# Patient Record
Sex: Female | Born: 1975 | Race: Black or African American | Hispanic: No | Marital: Married | Smoking: Never smoker
Health system: Southern US, Community
[De-identification: ages and names within clinical notes are randomized; demographics above are authoritative.]

## PROBLEM LIST (undated history)

## (undated) DIAGNOSIS — IMO0002 Reserved for concepts with insufficient information to code with codable children: Secondary | ICD-10-CM

## (undated) DIAGNOSIS — Z8619 Personal history of other infectious and parasitic diseases: Secondary | ICD-10-CM

## (undated) DIAGNOSIS — I10 Essential (primary) hypertension: Secondary | ICD-10-CM

## (undated) DIAGNOSIS — E66813 Obesity, class 3: Secondary | ICD-10-CM

## (undated) DIAGNOSIS — B009 Herpesviral infection, unspecified: Secondary | ICD-10-CM

## (undated) DIAGNOSIS — R0602 Shortness of breath: Secondary | ICD-10-CM

## (undated) DIAGNOSIS — E785 Hyperlipidemia, unspecified: Secondary | ICD-10-CM

## (undated) DIAGNOSIS — G4730 Sleep apnea, unspecified: Secondary | ICD-10-CM

## (undated) HISTORY — DX: Sleep apnea, unspecified: G47.30

## (undated) HISTORY — DX: Reserved for concepts with insufficient information to code with codable children: IMO0002

## (undated) HISTORY — DX: Obesity, class 3: E66.813

## (undated) HISTORY — DX: Herpesviral infection, unspecified: B00.9

## (undated) HISTORY — DX: Hyperlipidemia, unspecified: E78.5

## (undated) HISTORY — DX: Essential (primary) hypertension: I10

## (undated) HISTORY — DX: Morbid (severe) obesity due to excess calories: E66.01

## (undated) HISTORY — PX: TONSILLECTOMY: SUR1361

## (undated) HISTORY — DX: Personal history of other infectious and parasitic diseases: Z86.19

## (undated) HISTORY — PX: REFRACTIVE SURGERY: SHX103

---

## 1998-10-18 ENCOUNTER — Other Ambulatory Visit: Admission: RE | Admit: 1998-10-18 | Discharge: 1998-10-18 | Payer: Self-pay | Admitting: Obstetrics

## 1998-10-18 ENCOUNTER — Encounter (INDEPENDENT_AMBULATORY_CARE_PROVIDER_SITE_OTHER): Payer: Self-pay | Admitting: Specialist

## 1998-12-14 ENCOUNTER — Encounter: Payer: Self-pay | Admitting: Emergency Medicine

## 1998-12-14 ENCOUNTER — Inpatient Hospital Stay (HOSPITAL_COMMUNITY): Admission: EM | Admit: 1998-12-14 | Discharge: 1998-12-19 | Payer: Self-pay | Admitting: Emergency Medicine

## 1999-06-25 ENCOUNTER — Inpatient Hospital Stay (HOSPITAL_COMMUNITY): Admission: AD | Admit: 1999-06-25 | Discharge: 1999-06-25 | Payer: Self-pay | Admitting: Obstetrics

## 1999-06-26 ENCOUNTER — Encounter: Payer: Self-pay | Admitting: Obstetrics and Gynecology

## 1999-06-26 ENCOUNTER — Ambulatory Visit (HOSPITAL_COMMUNITY): Admission: RE | Admit: 1999-06-26 | Discharge: 1999-06-26 | Payer: Self-pay | Admitting: Obstetrics and Gynecology

## 2000-03-01 ENCOUNTER — Encounter: Payer: Self-pay | Admitting: Emergency Medicine

## 2000-03-01 ENCOUNTER — Emergency Department (HOSPITAL_COMMUNITY): Admission: EM | Admit: 2000-03-01 | Discharge: 2000-03-01 | Payer: Self-pay | Admitting: Emergency Medicine

## 2000-07-16 ENCOUNTER — Emergency Department (HOSPITAL_COMMUNITY): Admission: EM | Admit: 2000-07-16 | Discharge: 2000-07-16 | Payer: Self-pay | Admitting: Emergency Medicine

## 2000-09-23 ENCOUNTER — Ambulatory Visit (HOSPITAL_BASED_OUTPATIENT_CLINIC_OR_DEPARTMENT_OTHER): Admission: RE | Admit: 2000-09-23 | Discharge: 2000-09-23 | Payer: Self-pay | Admitting: *Deleted

## 2001-03-02 HISTORY — PX: OVARIAN CYST REMOVAL: SHX89

## 2001-07-28 ENCOUNTER — Encounter: Payer: Self-pay | Admitting: Obstetrics and Gynecology

## 2001-07-28 ENCOUNTER — Ambulatory Visit (HOSPITAL_COMMUNITY): Admission: RE | Admit: 2001-07-28 | Discharge: 2001-07-28 | Payer: Self-pay | Admitting: Obstetrics and Gynecology

## 2001-08-16 ENCOUNTER — Inpatient Hospital Stay (HOSPITAL_COMMUNITY): Admission: AD | Admit: 2001-08-16 | Discharge: 2001-08-16 | Payer: Self-pay | Admitting: Obstetrics and Gynecology

## 2001-09-07 ENCOUNTER — Encounter (INDEPENDENT_AMBULATORY_CARE_PROVIDER_SITE_OTHER): Payer: Self-pay

## 2001-09-07 ENCOUNTER — Inpatient Hospital Stay (HOSPITAL_COMMUNITY): Admission: AD | Admit: 2001-09-07 | Discharge: 2001-09-10 | Payer: Self-pay | Admitting: Obstetrics and Gynecology

## 2001-09-11 ENCOUNTER — Encounter: Admission: RE | Admit: 2001-09-11 | Discharge: 2001-10-11 | Payer: Self-pay | Admitting: Obstetrics and Gynecology

## 2001-12-01 ENCOUNTER — Encounter: Admission: RE | Admit: 2001-12-01 | Discharge: 2001-12-13 | Payer: Self-pay | Admitting: Internal Medicine

## 2002-02-13 ENCOUNTER — Other Ambulatory Visit: Admission: RE | Admit: 2002-02-13 | Discharge: 2002-02-13 | Payer: Self-pay | Admitting: Obstetrics and Gynecology

## 2002-08-17 ENCOUNTER — Other Ambulatory Visit: Admission: RE | Admit: 2002-08-17 | Discharge: 2002-08-17 | Payer: Self-pay | Admitting: Obstetrics and Gynecology

## 2003-01-30 ENCOUNTER — Ambulatory Visit (HOSPITAL_BASED_OUTPATIENT_CLINIC_OR_DEPARTMENT_OTHER): Admission: RE | Admit: 2003-01-30 | Discharge: 2003-01-30 | Payer: Self-pay | Admitting: Plastic Surgery

## 2003-01-30 ENCOUNTER — Encounter (INDEPENDENT_AMBULATORY_CARE_PROVIDER_SITE_OTHER): Payer: Self-pay | Admitting: Specialist

## 2003-01-30 ENCOUNTER — Ambulatory Visit (HOSPITAL_COMMUNITY): Admission: RE | Admit: 2003-01-30 | Discharge: 2003-01-30 | Payer: Self-pay | Admitting: Plastic Surgery

## 2003-09-04 ENCOUNTER — Emergency Department (HOSPITAL_COMMUNITY): Admission: EM | Admit: 2003-09-04 | Discharge: 2003-09-04 | Payer: Self-pay | Admitting: Emergency Medicine

## 2003-09-10 ENCOUNTER — Emergency Department (HOSPITAL_COMMUNITY): Admission: EM | Admit: 2003-09-10 | Discharge: 2003-09-10 | Payer: Self-pay | Admitting: Family Medicine

## 2003-10-16 ENCOUNTER — Encounter: Admission: RE | Admit: 2003-10-16 | Discharge: 2003-10-16 | Payer: Self-pay | Admitting: Internal Medicine

## 2004-12-09 ENCOUNTER — Other Ambulatory Visit: Admission: RE | Admit: 2004-12-09 | Discharge: 2004-12-09 | Payer: Self-pay | Admitting: Obstetrics and Gynecology

## 2005-03-18 ENCOUNTER — Emergency Department (HOSPITAL_COMMUNITY): Admission: EM | Admit: 2005-03-18 | Discharge: 2005-03-18 | Payer: Self-pay | Admitting: Emergency Medicine

## 2006-01-20 ENCOUNTER — Other Ambulatory Visit: Admission: RE | Admit: 2006-01-20 | Discharge: 2006-01-20 | Payer: Self-pay | Admitting: Obstetrics and Gynecology

## 2006-02-17 ENCOUNTER — Encounter: Admission: RE | Admit: 2006-02-17 | Discharge: 2006-02-17 | Payer: Self-pay | Admitting: Internal Medicine

## 2007-03-03 HISTORY — PX: WISDOM TOOTH EXTRACTION: SHX21

## 2008-03-02 DIAGNOSIS — B009 Herpesviral infection, unspecified: Secondary | ICD-10-CM

## 2008-03-02 HISTORY — DX: Herpesviral infection, unspecified: B00.9

## 2009-10-02 ENCOUNTER — Encounter: Admission: RE | Admit: 2009-10-02 | Discharge: 2009-11-28 | Payer: Self-pay | Admitting: Internal Medicine

## 2010-04-02 DIAGNOSIS — R87619 Unspecified abnormal cytological findings in specimens from cervix uteri: Secondary | ICD-10-CM

## 2010-04-02 DIAGNOSIS — IMO0002 Reserved for concepts with insufficient information to code with codable children: Secondary | ICD-10-CM

## 2010-04-02 HISTORY — DX: Reserved for concepts with insufficient information to code with codable children: IMO0002

## 2010-04-02 HISTORY — DX: Unspecified abnormal cytological findings in specimens from cervix uteri: R87.619

## 2010-07-18 NOTE — Op Note (Signed)
NAME:  Tina Flores, Tina Flores                            ACCOUNT NO.:  [id number]   MEDICAL RECORD NO.:  [id number]                   PATIENT TYPE:  OUT   LOCATION:  [facility]                                 FACILITY:  [facility]   PHYSICIAN:  Etter Sjogren, M.D.                  DATE OF BIRTH:  Jul 08, 1975   DATE OF PROCEDURE:  01/30/2003  DATE OF DISCHARGE:  01/30/2003                                 OPERATIVE REPORT   PREOPERATIVE DIAGNOSIS:  Very large, recurrent soft tissue mass of the  forehead of undetermined behavior, greater than 2.4 cm in largest diameter.   POSTOPERATIVE DIAGNOSES:  1. Very large, recurrent soft tissue mass of the forehead of undetermined     behavior, greater than 2.4 cm in largest diameter.  2. Complicated open wound of the forehead, approximately 5 cm.   PROCEDURES PERFORMED:  1. Excision of large soft tissue mass, extending down into the muscle,     forehead, greater than 2.4 cm in diameter.  2. Complex wound closure of the forehead __________ cm.   SURGEON:  Etter Sjogren, M.D.   ANESTHESIA:  1% Xylocaine with epinephrine placed.   CLINICAL NOTE:  This 35 year old woman has had a very large cyst of the  forehead.  This has been excised on two separate occasions by two other  physicians, and each time, it has recurred.  She presents at this time for a  repeat excision of this very persistent, difficult tumor.  The nature,  procedure, and risks were understood by her, including the possibility of  some hair loss where it extends up towards the hair line.  She understood  the possibility of recurrence, and she wishes to proceed.   PROCEDURE:  The patient was brought to the outpatient surgical suite and was  placed supine.  She was prepped with Betadine and draped with sterile  drapes.  Satisfactory local anesthesia was achieved.  The elliptical  incision was performed, and careful dissection was carried down to remove  the cyst in its entirety, removing the entire wall.   The cyst was not  ruptured. Thorough irrigation and hemostasis was confirmed.  Layered closure  was then performed using Monocryl sutures deep and interrupted inverted deep  sutures and interrupted inverted normal sutures, followed by 6-0 Prolene  sutures.  Antibiotic ointment was applied, and she tolerated the procedure  well.  She will see me in the office early next week.                                               Etter Sjogren, M.D.    DB/MEDQ  D:  03/15/2003  T:  03/15/2003  Job:  [id number]

## 2010-07-18 NOTE — H&P (Signed)
[facility]  Patient:    Tina Flores, Tina Flores Visit Number: [id number] MRN: [id number]          Service Type: OBS Location: [facility] Attending Physician:  Michael Litter Dictated by:   [name], M.D. Admit Date:  08/16/2001 Discharge Date: 08/16/2001                           History and Physical  HISTORY OF PRESENT ILLNESS:   The patient is a 35 year old female, gravida 2, para 0-0-1-0, who presents at [redacted] weeks gestation (EDC of September 13, 2001).  She presents for cesarean section and excision of a dermoid cyst.  The patient has been followed at [facility] for this pregnancy that has been complicated by the fact that she has a history of asthma.  She is also rubella nonimmune.  She has pregnancy-induced hypertension and she is currently taking Aldomet 500 mg b.i.d.  Nonstress tests have been reactive. She was found to have a breech infant on ultrasound.  She wishes to proceed with cesarean section.  The patient had an 8-cm mass noted on ultrasound that was consistent with a dermoid cyst.  OBSTETRICAL HISTORY:          The patient had an elective pregnancy termination in 1994.  PAST MEDICAL HISTORY:         The patient has a history of asthma.  She was taking Advair and albuterol.  DRUG ALLERGIES:               None known.  SOCIAL HISTORY:               The patient drank alcohol socially prior to pregnancy.  She denies cigarette use and other drug uses.  REVIEW OF SYSTEMS:            Normal pregnancy complaints.  FAMILY HISTORY:               Noncontributory.  PHYSICAL EXAMINATION:  VITAL SIGNS:                  Weight is 302 pounds.  HEENT:                        Within normal limits.  CHEST:                        Clear.  HEART:                        Regular rate and rhythm.  BREASTS:                      Without masses.  ABDOMEN:                      Gravid with a fundal height of 40  cm.  EXTREMITIES:                  Within normal limits and she is noted to have some edema.  NEUROLOGIC EXAM:              Grossly normal.  LABORATORY VALUES:            Blood type is O positive.  VDRL is nonreactive. Rubella nonimmune.  HBsAg negative.  HIV nonreactive.  Gonorrhea negative. Chlamydia negative.  Three-hour  GTT was within normal limits.  Third trimester beta strep is negative.  ASSESSMENT: 1. A 39-week gestation. 2. Breech infant. 3. An 8-cm left ovarian cyst consistent with a dermoid. 4. Gestational hypertension. 5. Obesity (weight 302 pounds). 6. Rubella nonimmune.  PLAN:                         The patient will undergo a cesarean section and left ovarian cystectomy for removal of a dermoid.  She understands the indications for her procedure and she accepts the risks of, but not limited to, anesthetic complications, bleeding, infections and possible damage to the surrounding organs. Dictated by:   [name], M.D. Attending Physician:  Michael Litter DD:  09/06/01 TD:  09/06/01 Job: [id number]

## 2010-07-18 NOTE — Discharge Summary (Signed)
[facility]  Patient:    Tina Flores, Tina Flores Visit Number: [id number] MRN: [id number]          Service Type: OBS Location: 910A 9115 01 Attending Physician:  Leonard Schwartz Dictated by:   Marcelle Smiling Clelia Croft, C.N.M. Admit Date:  09/07/2001 Discharge Date: 09/10/2001                             Discharge Summary  DATE OF BIRTH:  05-31-1975  ADMISSION DIAGNOSES: 1. Intrauterine pregnancy at term. 2. Breech presentation. 3. Hypertension. 4. Left ovarian dermoid cyst, approximate size is 8 cm.  DISCHARGE DIAGNOSES: 1. Intrauterine pregnancy at term. 2. Breech presentation. 3. Hypertension. 4. Left ovarian dermoid cyst, approximate size is 8 cm. 5. Primary low transverse cesarean section.  PROCEDURES: 1. Primary low transverse cesarean section. 2. Left ovarian cystectomy.  HOSPITAL COURSE:  The patient is a 35 year old female, gravida 2, para 0-0-1-0, who presented at [redacted] weeks gestation for a planned cesarean section and excision of a dermoid cyst.  Pregnancy had been remarkable for a history of asthma, rubella nonimmune, pregnancy induced hypertension.  Cesarean section was performed by Dr. Stefano Gaul and Mack Guise, C.N.M.  Infant was a viable female named [name] with Apgars of 8 and 9, and weight of 6 pounds 11 ounces.  Infant was doing well, was taken to the full-term nursery.  The patient was doing well and taken to the recovery room.  The patient was breast-feeding.  On postoperative day #1, her hemoglobin was 8.6, and preoperatively had been 10.5.  She expressed a desire for Nat Math IUD to be used for contraception.  She continued on Aldomet 500 mg p.o. b.i.d. while in the hospital, and her blood pressures remained anywhere from 130 to 140/58 to 90 during her hospital stay.  By postoperative day #3, she continued to do well, and was deemed to have received the full benefit of her hospital stay. She was discharged  home.  DISCHARGE INSTRUCTIONS:  Per [facility] handout.  DISCHARGE MEDICATIONS: 1. Motrin 600 mg p.o. q.6h. p.r.n. pain. 2. Tylox one or two p.o. q.3-4h. p.r.n. pain. 3. Aldomet 500 mg p.o. b.i.d. 4. Prenatal vitamin one p.o. q.d.  FOLLOWUP:  At one week postpartum at [facility] for blood pressure check. Dictated by:   Marcelle Smiling Clelia Croft, C.N.M. Attending Physician:  Leonard Schwartz DD:  09/10/01 TD:  09/13/01 Job: [id number]

## 2011-03-31 LAB — OB RESULTS CONSOLE HEPATITIS B SURFACE ANTIGEN: Hepatitis B Surface Ag: NEGATIVE

## 2011-03-31 LAB — OB RESULTS CONSOLE ABO/RH: RH Type: POSITIVE

## 2011-03-31 LAB — OB RESULTS CONSOLE RPR: RPR: NONREACTIVE

## 2011-03-31 LAB — OB RESULTS CONSOLE HIV ANTIBODY (ROUTINE TESTING): HIV: NONREACTIVE

## 2011-05-05 ENCOUNTER — Encounter: Payer: PRIVATE HEALTH INSURANCE | Admitting: Obstetrics and Gynecology

## 2011-05-13 ENCOUNTER — Encounter (INDEPENDENT_AMBULATORY_CARE_PROVIDER_SITE_OTHER): Payer: PRIVATE HEALTH INSURANCE | Admitting: Obstetrics and Gynecology

## 2011-05-13 DIAGNOSIS — Z331 Pregnant state, incidental: Secondary | ICD-10-CM

## 2011-05-18 ENCOUNTER — Encounter: Payer: PRIVATE HEALTH INSURANCE | Admitting: Obstetrics and Gynecology

## 2011-05-21 ENCOUNTER — Encounter: Payer: PRIVATE HEALTH INSURANCE | Admitting: Obstetrics and Gynecology

## 2011-05-28 ENCOUNTER — Encounter (INDEPENDENT_AMBULATORY_CARE_PROVIDER_SITE_OTHER): Payer: PRIVATE HEALTH INSURANCE | Admitting: Obstetrics and Gynecology

## 2011-05-28 DIAGNOSIS — Z3480 Encounter for supervision of other normal pregnancy, unspecified trimester: Secondary | ICD-10-CM

## 2011-06-02 ENCOUNTER — Other Ambulatory Visit: Payer: Self-pay

## 2011-06-02 DIAGNOSIS — Z3689 Encounter for other specified antenatal screening: Secondary | ICD-10-CM

## 2011-06-05 DIAGNOSIS — E119 Type 2 diabetes mellitus without complications: Secondary | ICD-10-CM

## 2011-06-05 DIAGNOSIS — I10 Essential (primary) hypertension: Secondary | ICD-10-CM | POA: Insufficient documentation

## 2011-06-05 DIAGNOSIS — Z6841 Body Mass Index (BMI) 40.0 and over, adult: Secondary | ICD-10-CM | POA: Insufficient documentation

## 2011-06-05 DIAGNOSIS — IMO0001 Reserved for inherently not codable concepts without codable children: Secondary | ICD-10-CM | POA: Insufficient documentation

## 2011-06-05 DIAGNOSIS — O09529 Supervision of elderly multigravida, unspecified trimester: Secondary | ICD-10-CM | POA: Insufficient documentation

## 2011-06-08 ENCOUNTER — Encounter: Payer: PRIVATE HEALTH INSURANCE | Admitting: Obstetrics and Gynecology

## 2011-06-15 ENCOUNTER — Encounter: Payer: Self-pay | Admitting: Obstetrics and Gynecology

## 2011-06-15 ENCOUNTER — Ambulatory Visit (INDEPENDENT_AMBULATORY_CARE_PROVIDER_SITE_OTHER): Payer: PRIVATE HEALTH INSURANCE

## 2011-06-15 ENCOUNTER — Ambulatory Visit (INDEPENDENT_AMBULATORY_CARE_PROVIDER_SITE_OTHER): Payer: PRIVATE HEALTH INSURANCE | Admitting: Obstetrics and Gynecology

## 2011-06-15 ENCOUNTER — Other Ambulatory Visit: Payer: Self-pay | Admitting: Obstetrics and Gynecology

## 2011-06-15 VITALS — BP 124/82 | Ht 67.0 in | Wt 330.0 lb

## 2011-06-15 DIAGNOSIS — O99810 Abnormal glucose complicating pregnancy: Secondary | ICD-10-CM

## 2011-06-15 DIAGNOSIS — Z3689 Encounter for other specified antenatal screening: Secondary | ICD-10-CM

## 2011-06-15 DIAGNOSIS — O24419 Gestational diabetes mellitus in pregnancy, unspecified control: Secondary | ICD-10-CM

## 2011-06-15 LAB — US OB COMP + 14 WK

## 2011-06-15 LAB — GLUCOSE, POCT (MANUAL RESULT ENTRY): POC Glucose: 227

## 2011-06-15 NOTE — Progress Notes (Signed)
Patient has not taken her medication bec of a change in price ($600 per month). She has an appointment with her endocrinologist tomorrow. [place]: SIUP, normal fluid, 20 3/7 weeks (55%), Cx: 3.88 cm, female, not all anatomy seen (normal GU tract), 2 vessel cord. VBAC consent form signed. Wants CS and BTL. RTO 1 week.  AVS

## 2011-06-15 NOTE — Progress Notes (Signed)
Pt did not bring a recorded log of her sugars Blood Sugar : 227 Pt had  Pizza /Sweet Tea for [employer]

## 2011-06-24 ENCOUNTER — Other Ambulatory Visit: Payer: Self-pay

## 2011-06-24 ENCOUNTER — Ambulatory Visit (INDEPENDENT_AMBULATORY_CARE_PROVIDER_SITE_OTHER): Payer: PRIVATE HEALTH INSURANCE | Admitting: Obstetrics and Gynecology

## 2011-06-24 ENCOUNTER — Ambulatory Visit (INDEPENDENT_AMBULATORY_CARE_PROVIDER_SITE_OTHER): Payer: PRIVATE HEALTH INSURANCE

## 2011-06-24 ENCOUNTER — Encounter: Payer: Self-pay | Admitting: Obstetrics and Gynecology

## 2011-06-24 VITALS — BP 116/78 | Wt 328.0 lb

## 2011-06-24 DIAGNOSIS — O24419 Gestational diabetes mellitus in pregnancy, unspecified control: Secondary | ICD-10-CM

## 2011-06-24 DIAGNOSIS — O99810 Abnormal glucose complicating pregnancy: Secondary | ICD-10-CM

## 2011-06-24 DIAGNOSIS — O24919 Unspecified diabetes mellitus in pregnancy, unspecified trimester: Secondary | ICD-10-CM

## 2011-06-24 DIAGNOSIS — Q270 Congenital absence and hypoplasia of umbilical artery: Secondary | ICD-10-CM

## 2011-06-24 DIAGNOSIS — E119 Type 2 diabetes mellitus without complications: Secondary | ICD-10-CM

## 2011-06-24 LAB — US OB FOLLOW UP

## 2011-06-24 NOTE — Progress Notes (Signed)
Ultrasound shows:  SIUP  S=D     [place] EDD: c/w dates            AFI: normal           Cervical length: 3.4 cm           Placenta localization: rt lateral           Fetal presentation: breech                    Anatomy survey is normal and now completed but there is a 2V cord           Gender : female previously seen No complaints CBGs good per pt.  Pt is being followed by Dr. Lucianne Muss with next appt 07/09/11 Currently taking 28u 3x/day with meals of Humalog and Levemir 48u qam and pm RTO ROB in 4wks and ROB with U/S for EFW in 6wks

## 2011-07-21 ENCOUNTER — Ambulatory Visit (INDEPENDENT_AMBULATORY_CARE_PROVIDER_SITE_OTHER): Payer: PRIVATE HEALTH INSURANCE | Admitting: Obstetrics and Gynecology

## 2011-07-21 VITALS — BP 122/72 | Wt 336.0 lb

## 2011-07-21 DIAGNOSIS — R350 Frequency of micturition: Secondary | ICD-10-CM

## 2011-07-21 DIAGNOSIS — O0990 Supervision of high risk pregnancy, unspecified, unspecified trimester: Secondary | ICD-10-CM

## 2011-07-21 LAB — POCT URINALYSIS DIPSTICK
Leukocytes, UA: NEGATIVE
Urobilinogen, UA: NEGATIVE
pH, UA: 7

## 2011-07-21 NOTE — Progress Notes (Signed)
I listened to the fetal heart tones and they were 144 bpm.  Patient has an obese abdomen and heart tones can be hard to hear. Speculum exam:Pooling: negative.  Fern: Negative.  Nitrazine: Negative.  Cervix closed and long.  No signs for rupture of membranes. Blood sugars are still elevated.  Patient did not bring her log.  She says her fasting blood sugars are 100-115.  Patient was told to improve her diet and exercise regularly.  Compliance was stressed. Return to office in 1 week. Ultrasound is scheduled for 2 weeks from now.

## 2011-07-28 ENCOUNTER — Ambulatory Visit (INDEPENDENT_AMBULATORY_CARE_PROVIDER_SITE_OTHER): Payer: PRIVATE HEALTH INSURANCE | Admitting: Obstetrics and Gynecology

## 2011-07-28 VITALS — BP 126/70 | Wt 341.0 lb

## 2011-07-28 DIAGNOSIS — O0990 Supervision of high risk pregnancy, unspecified, unspecified trimester: Secondary | ICD-10-CM

## 2011-07-28 NOTE — Progress Notes (Signed)
[place] next visit. FBS 108-114. Pt will see Endo this week. RTO 1 week.

## 2011-07-28 NOTE — Progress Notes (Signed)
Pt has numbness on hands especially at night x 3-4 days

## 2011-08-03 ENCOUNTER — Ambulatory Visit (INDEPENDENT_AMBULATORY_CARE_PROVIDER_SITE_OTHER): Payer: PRIVATE HEALTH INSURANCE | Admitting: Obstetrics and Gynecology

## 2011-08-03 ENCOUNTER — Encounter: Payer: PRIVATE HEALTH INSURANCE | Admitting: Obstetrics and Gynecology

## 2011-08-03 ENCOUNTER — Ambulatory Visit (INDEPENDENT_AMBULATORY_CARE_PROVIDER_SITE_OTHER): Payer: PRIVATE HEALTH INSURANCE

## 2011-08-03 ENCOUNTER — Encounter: Payer: Self-pay | Admitting: Obstetrics and Gynecology

## 2011-08-03 VITALS — BP 118/78 | Wt 344.0 lb

## 2011-08-03 DIAGNOSIS — O0990 Supervision of high risk pregnancy, unspecified, unspecified trimester: Secondary | ICD-10-CM

## 2011-08-03 DIAGNOSIS — O24919 Unspecified diabetes mellitus in pregnancy, unspecified trimester: Secondary | ICD-10-CM

## 2011-08-03 LAB — US OB FOLLOW UP

## 2011-08-03 NOTE — Progress Notes (Signed)
Pt states no complaints today

## 2011-08-04 NOTE — Progress Notes (Signed)
Doing well.  Type 2 diabetes on insulin, chronic hypertension on Labetalol 200 mg po BID, HCTZ 25 mg q day, 2 VC, obesity Pedal edema +2--feels it is slightly worse than previously.  Normotensive.  Hx +4 edema before HCTZ started.  CBGs fasting < 90, 2 hour PP < 120. Followed by endocrinologist.   Having carpal tunnel symptoms in right hand--requests referral to orthopedist.  [place] today--Frank breech, 2 vc, normal fluid at 49%ile.  Cx 3.27.  EFW 2+5, 49%ile. Per plan of care by Dr. Su Hilt for patient's diabetes, [place] q 4 weeks for growth, start NST 2x/week at 32 weeks. Patient agreeable with plan of care. RTO in 1 week for recheck of edema, determination of further plan of care (? 24 hour urine, PIH labs?).  To push fluids and increase rest.

## 2011-08-05 ENCOUNTER — Telehealth: Payer: Self-pay | Admitting: Obstetrics and Gynecology

## 2011-08-05 NOTE — Telephone Encounter (Signed)
TC to pt.   Per VL, scheduled appt with DR Charlett Blake for carpal tunnel of rt hand  08/06/11 at 9:45.  Pt unable to keep that appt.  Phone number given to reschedule.

## 2011-08-06 ENCOUNTER — Telehealth: Payer: Self-pay | Admitting: Obstetrics and Gynecology

## 2011-08-06 NOTE — Telephone Encounter (Signed)
Incoming call from pt c/o Nausea and vomiting since yesterday, also has temp of 100.0. States her daughter has had viral illness this week. Pt is type 2 diabetic on insulin, hasn't checked CBG today, but hasn't taken novalog, did take levemir insulin, hasn't taken glucophage today either. Called in RX for phnergan suppository and PO and zofran ODT. Instructed pt to try gatorade/pedialyte after antiemetics, and then liquids and/or BRAT diet as tolerated. Also take tylenol 1g PO q 4 hr for the next 24-48 hours. Call if antiemetics not working or temp remains >101. Pt has appointment Monday, reports GFM, no ctx, no VB, no pain.

## 2011-08-10 ENCOUNTER — Encounter: Payer: PRIVATE HEALTH INSURANCE | Admitting: Obstetrics and Gynecology

## 2011-08-14 ENCOUNTER — Encounter: Payer: Self-pay | Admitting: Obstetrics and Gynecology

## 2011-08-14 ENCOUNTER — Ambulatory Visit (INDEPENDENT_AMBULATORY_CARE_PROVIDER_SITE_OTHER): Payer: PRIVATE HEALTH INSURANCE | Admitting: Obstetrics and Gynecology

## 2011-08-14 VITALS — BP 110/70 | Wt 334.0 lb

## 2011-08-14 DIAGNOSIS — Z3490 Encounter for supervision of normal pregnancy, unspecified, unspecified trimester: Secondary | ICD-10-CM

## 2011-08-14 DIAGNOSIS — Z3480 Encounter for supervision of other normal pregnancy, unspecified trimester: Secondary | ICD-10-CM

## 2011-08-14 NOTE — Progress Notes (Signed)
Pt states on concerns today.

## 2011-08-14 NOTE — Patient Instructions (Signed)
Fetal Movement Counts Patient Name: __________________________________________________ Patient Due Date: ____________________ Kick counts is highly recommended in high risk pregnancies, but it is a good idea for every pregnant woman to do. Start counting fetal movements at 28 weeks of the pregnancy. Fetal movements increase after eating a full meal or eating or drinking something sweet (the blood sugar is higher). It is also important to drink plenty of fluids (well hydrated) before doing the count. Lie on your left side because it helps with the circulation or you can sit in a comfortable chair with your arms over your belly (abdomen) with no distractions around you. DOING THE COUNT  Try to do the count the same time of day each time you do it.   Mark the day and time, then see how long it takes for you to feel 10 movements (kicks, flutters, swishes, rolls). You should have at least 10 movements within 2 hours. You will most likely feel 10 movements in much less than 2 hours. If you do not, wait an hour and count again. After a couple of days you will see a pattern.   What you are looking for is a change in the pattern or not enough counts in 2 hours. Is it taking longer in time to reach 10 movements?  SEEK MEDICAL CARE IF:  You feel less than 10 counts in 2 hours. Tried twice.   No movement in one hour.   The pattern is changing or taking longer each day to reach 10 counts in 2 hours.   You feel the baby is not moving as it usually does.  Date: ____________ Movements: ____________ Start time: ____________ Finish time: ____________  Date: ____________ Movements: ____________ Start time: ____________ Finish time: ____________ Date: ____________ Movements: ____________ Start time: ____________ Finish time: ____________ Date: ____________ Movements: ____________ Start time: ____________ Finish time: ____________ Date: ____________ Movements: ____________ Start time: ____________ Finish time:  ____________ Date: ____________ Movements: ____________ Start time: ____________ Finish time: ____________ Date: ____________ Movements: ____________ Start time: ____________ Finish time: ____________ Date: ____________ Movements: ____________ Start time: ____________ Finish time: ____________  Date: ____________ Movements: ____________ Start time: ____________ Finish time: ____________ Date: ____________ Movements: ____________ Start time: ____________ Finish time: ____________ Date: ____________ Movements: ____________ Start time: ____________ Finish time: ____________ Date: ____________ Movements: ____________ Start time: ____________ Finish time: ____________ Date: ____________ Movements: ____________ Start time: ____________ Finish time: ____________ Date: ____________ Movements: ____________ Start time: ____________ Finish time: ____________ Date: ____________ Movements: ____________ Start time: ____________ Finish time: ____________  Date: ____________ Movements: ____________ Start time: ____________ Finish time: ____________ Date: ____________ Movements: ____________ Start time: ____________ Finish time: ____________ Date: ____________ Movements: ____________ Start time: ____________ Finish time: ____________ Date: ____________ Movements: ____________ Start time: ____________ Finish time: ____________ Date: ____________ Movements: ____________ Start time: ____________ Finish time: ____________ Date: ____________ Movements: ____________ Start time: ____________ Finish time: ____________ Date: ____________ Movements: ____________ Start time: ____________ Finish time: ____________  Date: ____________ Movements: ____________ Start time: ____________ Finish time: ____________ Date: ____________ Movements: ____________ Start time: ____________ Finish time: ____________ Date: ____________ Movements: ____________ Start time: ____________ Finish time: ____________ Date: ____________ Movements:  ____________ Start time: ____________ Finish time: ____________ Date: ____________ Movements: ____________ Start time: ____________ Finish time: ____________ Date: ____________ Movements: ____________ Start time: ____________ Finish time: ____________ Date: ____________ Movements: ____________ Start time: ____________ Finish time: ____________  Date: ____________ Movements: ____________ Start time: ____________ Finish time: ____________ Date: ____________ Movements: ____________ Start time: ____________ Finish time: ____________ Date: ____________ Movements: ____________ Start time:   ____________ Finish time: ____________ Date: ____________ Movements: ____________ Start time: ____________ Finish time: ____________ Date: ____________ Movements: ____________ Start time: ____________ Finish time: ____________ Date: ____________ Movements: ____________ Start time: ____________ Finish time: ____________ Date: ____________ Movements: ____________ Start time: ____________ Finish time: ____________  Date: ____________ Movements: ____________ Start time: ____________ Finish time: ____________ Date: ____________ Movements: ____________ Start time: ____________ Finish time: ____________ Date: ____________ Movements: ____________ Start time: ____________ Finish time: ____________ Date: ____________ Movements: ____________ Start time: ____________ Finish time: ____________ Date: ____________ Movements: ____________ Start time: ____________ Finish time: ____________ Date: ____________ Movements: ____________ Start time: ____________ Finish time: ____________ Date: ____________ Movements: ____________ Start time: ____________ Finish time: ____________  Date: ____________ Movements: ____________ Start time: ____________ Finish time: ____________ Date: ____________ Movements: ____________ Start time: ____________ Finish time: ____________ Date: ____________ Movements: ____________ Start time: ____________ Finish  time: ____________ Date: ____________ Movements: ____________ Start time: ____________ Finish time: ____________ Date: ____________ Movements: ____________ Start time: ____________ Finish time: ____________ Date: ____________ Movements: ____________ Start time: ____________ Finish time: ____________ Date: ____________ Movements: ____________ Start time: ____________ Finish time: ____________  Date: ____________ Movements: ____________ Start time: ____________ Finish time: ____________ Date: ____________ Movements: ____________ Start time: ____________ Finish time: ____________ Date: ____________ Movements: ____________ Start time: ____________ Finish time: ____________ Date: ____________ Movements: ____________ Start time: ____________ Finish time: ____________ Date: ____________ Movements: ____________ Start time: ____________ Finish time: ____________ Date: ____________ Movements: ____________ Start time: ____________ Finish time: ____________ Document Released: 03/18/2006 Document Revised: 02/05/2011 Document Reviewed: 09/18/2008 Exit[employer] Patient Information 2012 [employer]. 

## 2011-08-14 NOTE — Progress Notes (Signed)
Pt without c/o Reports FBS 109-114  2hr pp all less than 120 Type 2 dm.  Blood sugar managed by Dr Lucianne Muss.  She is raising insulin to try to decrease fasting values.  [place] for growth @NV  HTN blood pressure controlled on labetalol Check cbc and rpr

## 2011-08-15 LAB — CBC WITH DIFFERENTIAL/PLATELET
Basophils Absolute: 0 10*3/uL (ref 0.0–0.1)
HCT: 30.1 % — ABNORMAL LOW (ref 36.0–46.0)
Hemoglobin: 9.5 g/dL — ABNORMAL LOW (ref 12.0–15.0)
Lymphocytes Relative: 15 % (ref 12–46)
Monocytes Absolute: 0.4 10*3/uL (ref 0.1–1.0)
Monocytes Relative: 5 % (ref 3–12)
Neutro Abs: 6.2 10*3/uL (ref 1.7–7.7)
RBC: 3.81 MIL/uL — ABNORMAL LOW (ref 3.87–5.11)
RDW: 17.4 % — ABNORMAL HIGH (ref 11.5–15.5)
WBC: 7.9 10*3/uL (ref 4.0–10.5)

## 2011-08-20 ENCOUNTER — Other Ambulatory Visit: Payer: PRIVATE HEALTH INSURANCE

## 2011-08-20 ENCOUNTER — Encounter: Payer: PRIVATE HEALTH INSURANCE | Admitting: Obstetrics and Gynecology

## 2011-08-24 ENCOUNTER — Ambulatory Visit (INDEPENDENT_AMBULATORY_CARE_PROVIDER_SITE_OTHER): Payer: PRIVATE HEALTH INSURANCE | Admitting: Obstetrics and Gynecology

## 2011-08-24 ENCOUNTER — Encounter: Payer: Self-pay | Admitting: Obstetrics and Gynecology

## 2011-08-24 ENCOUNTER — Ambulatory Visit (INDEPENDENT_AMBULATORY_CARE_PROVIDER_SITE_OTHER): Payer: PRIVATE HEALTH INSURANCE

## 2011-08-24 VITALS — BP 130/80 | Wt 340.0 lb

## 2011-08-24 DIAGNOSIS — O24919 Unspecified diabetes mellitus in pregnancy, unspecified trimester: Secondary | ICD-10-CM

## 2011-08-24 DIAGNOSIS — O10019 Pre-existing essential hypertension complicating pregnancy, unspecified trimester: Secondary | ICD-10-CM

## 2011-08-24 DIAGNOSIS — Z98891 History of uterine scar from previous surgery: Secondary | ICD-10-CM | POA: Insufficient documentation

## 2011-08-24 DIAGNOSIS — Z9889 Other specified postprocedural states: Secondary | ICD-10-CM

## 2011-08-24 DIAGNOSIS — O0990 Supervision of high risk pregnancy, unspecified, unspecified trimester: Secondary | ICD-10-CM

## 2011-08-24 NOTE — Progress Notes (Signed)
CHTN: Labetalol 200 mg daily and HCTZ 25 mg daily Type 2 DB managed by Dr Lucianne Muss: FBS ranging around 109 2hr PC 120-130 Ultrasound: EFW 3 lbs 2 oz   16%                     AFI normal                    [place] breech Desires repeat c/s with BTL  Reviewed

## 2011-08-24 NOTE — Progress Notes (Signed)
Pt states no concerns today.   

## 2011-08-26 LAB — US OB FOLLOW UP

## 2011-08-28 ENCOUNTER — Telehealth: Payer: Self-pay

## 2011-08-28 NOTE — Telephone Encounter (Signed)
Spoke to [facility] re: refill request on HCTZ for this pt.  Per ND, pt is no longer taking this, so RF was denied. Melody Comas A

## 2011-08-31 ENCOUNTER — Other Ambulatory Visit: Payer: Self-pay | Admitting: Obstetrics and Gynecology

## 2011-08-31 NOTE — Telephone Encounter (Signed)
Pt calling [facility] rx hctz

## 2011-08-31 NOTE — Telephone Encounter (Signed)
Niccole/signed for phone call

## 2011-09-01 ENCOUNTER — Telehealth: Payer: Self-pay

## 2011-09-01 NOTE — Telephone Encounter (Signed)
Spoke to [facility] to refill HCTZ 25 mg sig 1 po qd # 30 w 1 addt"l RF per AVS. He will eval pt's need to continue on this med at next OV on the 10th of July. Melody Comas A

## 2011-09-01 NOTE — Telephone Encounter (Signed)
LM for pt that HCTZ has been RF'd. Melody Comas A

## 2011-09-09 ENCOUNTER — Encounter: Payer: Self-pay | Admitting: Obstetrics and Gynecology

## 2011-09-09 ENCOUNTER — Ambulatory Visit (INDEPENDENT_AMBULATORY_CARE_PROVIDER_SITE_OTHER): Payer: PRIVATE HEALTH INSURANCE | Admitting: Obstetrics and Gynecology

## 2011-09-09 ENCOUNTER — Ambulatory Visit (INDEPENDENT_AMBULATORY_CARE_PROVIDER_SITE_OTHER): Payer: PRIVATE HEALTH INSURANCE

## 2011-09-09 VITALS — BP 124/76 | Wt 343.0 lb

## 2011-09-09 DIAGNOSIS — E119 Type 2 diabetes mellitus without complications: Secondary | ICD-10-CM

## 2011-09-09 DIAGNOSIS — O24919 Unspecified diabetes mellitus in pregnancy, unspecified trimester: Secondary | ICD-10-CM

## 2011-09-09 DIAGNOSIS — I10 Essential (primary) hypertension: Secondary | ICD-10-CM

## 2011-09-09 DIAGNOSIS — O0990 Supervision of high risk pregnancy, unspecified, unspecified trimester: Secondary | ICD-10-CM

## 2011-09-09 NOTE — Progress Notes (Signed)
Patient reports that she is ready to have this baby. Wants C-section and BTL. Patient did not bring blood sugars.  She says her fastings are less than 109.  Her 2 hour p.c. blood sugars are usually around 120.  She did have 1 blood sugar that was 187. Ultrasound: Single gestation, breech, normal fluid, cervix 3.54 cm, 31 weeks and 2 days (16%), BPP 8 out of 8. Return office in one week. Dr. Stefano Gaul

## 2011-09-09 NOTE — Progress Notes (Signed)
Pt sees Endocrinologist for review of blood sugars.

## 2011-09-10 ENCOUNTER — Other Ambulatory Visit: Payer: Self-pay | Admitting: Obstetrics and Gynecology

## 2011-09-10 ENCOUNTER — Telehealth: Payer: Self-pay | Admitting: Obstetrics and Gynecology

## 2011-09-10 DIAGNOSIS — O0990 Supervision of high risk pregnancy, unspecified, unspecified trimester: Secondary | ICD-10-CM

## 2011-09-10 LAB — US OB FOLLOW UP

## 2011-09-10 NOTE — Telephone Encounter (Signed)
Repeat C/S and BTL scheduled for 10/23/11 @ 9:00 with AVS/CNM. -Adrianne Pridgen

## 2011-09-17 ENCOUNTER — Ambulatory Visit (INDEPENDENT_AMBULATORY_CARE_PROVIDER_SITE_OTHER): Payer: PRIVATE HEALTH INSURANCE

## 2011-09-17 ENCOUNTER — Ambulatory Visit (INDEPENDENT_AMBULATORY_CARE_PROVIDER_SITE_OTHER): Payer: PRIVATE HEALTH INSURANCE | Admitting: Obstetrics and Gynecology

## 2011-09-17 ENCOUNTER — Encounter: Payer: Self-pay | Admitting: Obstetrics and Gynecology

## 2011-09-17 VITALS — BP 122/64 | Wt 349.0 lb

## 2011-09-17 DIAGNOSIS — E119 Type 2 diabetes mellitus without complications: Secondary | ICD-10-CM

## 2011-09-17 DIAGNOSIS — O24919 Unspecified diabetes mellitus in pregnancy, unspecified trimester: Secondary | ICD-10-CM

## 2011-09-17 DIAGNOSIS — Q270 Congenital absence and hypoplasia of umbilical artery: Secondary | ICD-10-CM

## 2011-09-17 DIAGNOSIS — J45909 Unspecified asthma, uncomplicated: Secondary | ICD-10-CM

## 2011-09-17 DIAGNOSIS — O0990 Supervision of high risk pregnancy, unspecified, unspecified trimester: Secondary | ICD-10-CM

## 2011-09-17 DIAGNOSIS — I10 Essential (primary) hypertension: Secondary | ICD-10-CM

## 2011-09-17 DIAGNOSIS — O09529 Supervision of elderly multigravida, unspecified trimester: Secondary | ICD-10-CM

## 2011-09-17 MED ORDER — FLUTICASONE-SALMETEROL 500-50 MCG/DOSE IN AEPB: 1.0000 | INHALATION_SPRAY | Freq: Every day | RESPIRATORY_TRACT | Status: DC

## 2011-09-17 NOTE — Progress Notes (Signed)
Pt c/o pain in vaginal area. States that it feels swollen. Believes that it is coming from sitting long periods of time. Refused vaginal exam Type II DM . EndoLucianne Muss MD  Sees him no less than once a month Insulin Levemir  56 hs     44am   Novolog 24-28 ac meals based on carbs Chronic HBP BPP- 8/8  Asthma: Currently using rescue inhaler >4 times daily.  D/C'd Advair at start of pregnancy.  Will restart once daily

## 2011-09-23 ENCOUNTER — Ambulatory Visit (INDEPENDENT_AMBULATORY_CARE_PROVIDER_SITE_OTHER): Payer: PRIVATE HEALTH INSURANCE | Admitting: Obstetrics and Gynecology

## 2011-09-23 ENCOUNTER — Ambulatory Visit (INDEPENDENT_AMBULATORY_CARE_PROVIDER_SITE_OTHER): Payer: PRIVATE HEALTH INSURANCE

## 2011-09-23 ENCOUNTER — Encounter: Payer: Self-pay | Admitting: Obstetrics and Gynecology

## 2011-09-23 VITALS — BP 126/82 | Wt 350.0 lb

## 2011-09-23 DIAGNOSIS — O24919 Unspecified diabetes mellitus in pregnancy, unspecified trimester: Secondary | ICD-10-CM

## 2011-09-23 DIAGNOSIS — E119 Type 2 diabetes mellitus without complications: Secondary | ICD-10-CM

## 2011-09-23 DIAGNOSIS — O169 Unspecified maternal hypertension, unspecified trimester: Secondary | ICD-10-CM

## 2011-09-23 DIAGNOSIS — O09529 Supervision of elderly multigravida, unspecified trimester: Secondary | ICD-10-CM

## 2011-09-23 DIAGNOSIS — I10 Essential (primary) hypertension: Secondary | ICD-10-CM

## 2011-09-23 DIAGNOSIS — Q270 Congenital absence and hypoplasia of umbilical artery: Secondary | ICD-10-CM

## 2011-09-23 NOTE — Progress Notes (Signed)
Ultrasound: Single gestation, breech presentation, normal fluid, BPP 8 out of 8. Patient reports that blood sugars are normal. C-section and tubal ligation discussed. Repeat ultrasound next visit with biophysical profile. Return office in 1 week. Dr. Stefano Gaul

## 2011-09-23 NOTE — Progress Notes (Signed)
Pt states she has an increase in pain.

## 2011-09-29 ENCOUNTER — Other Ambulatory Visit: Payer: Self-pay | Admitting: Obstetrics and Gynecology

## 2011-09-29 ENCOUNTER — Ambulatory Visit (INDEPENDENT_AMBULATORY_CARE_PROVIDER_SITE_OTHER): Payer: PRIVATE HEALTH INSURANCE | Admitting: Obstetrics and Gynecology

## 2011-09-29 ENCOUNTER — Ambulatory Visit (INDEPENDENT_AMBULATORY_CARE_PROVIDER_SITE_OTHER): Payer: PRIVATE HEALTH INSURANCE

## 2011-09-29 VITALS — BP 122/62 | Wt 355.0 lb

## 2011-09-29 DIAGNOSIS — O26849 Uterine size-date discrepancy, unspecified trimester: Secondary | ICD-10-CM

## 2011-09-29 DIAGNOSIS — E119 Type 2 diabetes mellitus without complications: Secondary | ICD-10-CM

## 2011-09-29 DIAGNOSIS — Z3490 Encounter for supervision of normal pregnancy, unspecified, unspecified trimester: Secondary | ICD-10-CM

## 2011-09-29 DIAGNOSIS — O0990 Supervision of high risk pregnancy, unspecified, unspecified trimester: Secondary | ICD-10-CM

## 2011-09-29 DIAGNOSIS — Z331 Pregnant state, incidental: Secondary | ICD-10-CM

## 2011-09-29 DIAGNOSIS — O24919 Unspecified diabetes mellitus in pregnancy, unspecified trimester: Secondary | ICD-10-CM

## 2011-09-29 NOTE — Progress Notes (Signed)
The patient reports that her blood sugars are high.  Her endocrinologist has adjusted her insulin.  The patient says that the endocrinologist is pleased overall. Ultrasound: Single gestation, breech, normal fluid, BPP at 8 out of 8, cervix 3.24 cm, 33 weeks and 6 days (24th percentile). BPP next visit. Cesarean section is scheduled. Beta strep sent. Dr. Stefano Gaul

## 2011-09-30 ENCOUNTER — Telehealth: Payer: Self-pay | Admitting: Obstetrics and Gynecology

## 2011-09-30 NOTE — Telephone Encounter (Signed)
The patient was called on the telephone.  Her history was reviewed.  The patient is a 36 year old female, gravida 6 para 1-0-4-1, who is currently at 35 weeks and 5 days gestation.  Her due date is October 30, 2011. She has had a prior low transverse cesarean section.  The patient has chronic hypertension, diabetes, and obesity.  She is on medication for her hypertension and her diabetes.  She is scheduled for repeat cesarean section and tubal ligation on October 23, 2011.  The patient was discussed at our high-risk conference today.  The physicians feel that it is appropriate to offer the patient is a cesarean section as early as [redacted] weeks gestation.  The patient was notified that we can potentially deliver her baby on October 16, 2011.  The patient was told that I will not be available to do her cesarean section until October 20, 2011.  The patient will discuss the options with her husband.  She will call us back and let us know if she wants to have her cesarean section on October 23, 2011, October 20, 2011, or October 16, 2011 with the understanding that another physician in our practice will deliver her baby if she decides to deliver on October 16, 2011.  Dr. Stefano Gaul

## 2011-10-01 LAB — US OB FOLLOW UP

## 2011-10-01 LAB — STREP B DNA PROBE: GBSP: POSITIVE

## 2011-10-05 ENCOUNTER — Ambulatory Visit (INDEPENDENT_AMBULATORY_CARE_PROVIDER_SITE_OTHER): Payer: PRIVATE HEALTH INSURANCE | Admitting: Obstetrics and Gynecology

## 2011-10-05 ENCOUNTER — Other Ambulatory Visit: Payer: Self-pay | Admitting: Obstetrics and Gynecology

## 2011-10-05 ENCOUNTER — Ambulatory Visit (INDEPENDENT_AMBULATORY_CARE_PROVIDER_SITE_OTHER): Payer: PRIVATE HEALTH INSURANCE

## 2011-10-05 VITALS — BP 140/82 | Wt 355.0 lb

## 2011-10-05 DIAGNOSIS — O24919 Unspecified diabetes mellitus in pregnancy, unspecified trimester: Secondary | ICD-10-CM

## 2011-10-05 DIAGNOSIS — O26849 Uterine size-date discrepancy, unspecified trimester: Secondary | ICD-10-CM

## 2011-10-05 DIAGNOSIS — E119 Type 2 diabetes mellitus without complications: Secondary | ICD-10-CM

## 2011-10-05 DIAGNOSIS — O0990 Supervision of high risk pregnancy, unspecified, unspecified trimester: Secondary | ICD-10-CM

## 2011-10-05 NOTE — Progress Notes (Signed)
[name] states she is not feeling baby as much during the day, feeling baby move more at night.  Pt states she has no concerns today.  Pt did not take Blood Pressure Medication today.

## 2011-10-05 NOTE — Progress Notes (Signed)
Ultrasound: Breech presentation, single gestation, normal fluid, BPP 8 out of 8. Insulin was adjusted by her endocrinologist.  Fasting blood sugars less than 110.  Two-hour p.c. Blood sugars less than 120. Return to office in 1 week.  Biophysical profile next visit. Cesarean section and BTL at 38 weeks because of chronic hypertension and diabetes per patient request.  The patient will meet with Dr. Estanislado Pandy next visit. Dr. Stefano Gaul

## 2011-10-06 ENCOUNTER — Telehealth: Payer: Self-pay | Admitting: Obstetrics and Gynecology

## 2011-10-06 NOTE — Telephone Encounter (Signed)
Repeat C/S and BTL scheduled for 10/16/11 @ 9:00 with SR/MK. -Adrianne Pridgen

## 2011-10-07 ENCOUNTER — Telehealth: Payer: Self-pay | Admitting: Obstetrics and Gynecology

## 2011-10-07 NOTE — Telephone Encounter (Signed)
Repeat C/S and BTL scheduled for 10/20/11 @ 9:00 with AVS/CNM. -Adrianne Pridgen

## 2011-10-12 ENCOUNTER — Encounter: Payer: PRIVATE HEALTH INSURANCE | Admitting: Obstetrics and Gynecology

## 2011-10-12 ENCOUNTER — Other Ambulatory Visit: Payer: PRIVATE HEALTH INSURANCE

## 2011-10-12 ENCOUNTER — Ambulatory Visit (INDEPENDENT_AMBULATORY_CARE_PROVIDER_SITE_OTHER): Payer: PRIVATE HEALTH INSURANCE

## 2011-10-12 ENCOUNTER — Ambulatory Visit (INDEPENDENT_AMBULATORY_CARE_PROVIDER_SITE_OTHER): Payer: PRIVATE HEALTH INSURANCE | Admitting: Obstetrics and Gynecology

## 2011-10-12 VITALS — BP 118/80 | Wt 359.0 lb

## 2011-10-12 DIAGNOSIS — O0990 Supervision of high risk pregnancy, unspecified, unspecified trimester: Secondary | ICD-10-CM

## 2011-10-12 NOTE — Progress Notes (Signed)
Pt was unable to leave sample earlier. Pt is just now leaving a sample that will need to be checked if not entered by [facility] CMA

## 2011-10-13 ENCOUNTER — Encounter: Payer: Self-pay | Admitting: Obstetrics and Gynecology

## 2011-10-13 ENCOUNTER — Encounter (HOSPITAL_COMMUNITY): Payer: Self-pay | Admitting: Pharmacist

## 2011-10-13 NOTE — Progress Notes (Signed)
[redacted]w[redacted]d Surgery scheduled for next week. Surgery review. Ultrasound: Single gestation, breech, normal fluid, biophysical profile 8 out of 8. Repeat biophysical profile next week. The patient states that her endocrinologist continues to adjust her blood sugars. Dr. Stefano Gaul

## 2011-10-15 ENCOUNTER — Encounter: Payer: Self-pay | Admitting: Obstetrics and Gynecology

## 2011-10-15 NOTE — H&P (Signed)
[name] is a 36 y.o. female presenting for cesarean section and tubal ligation. The patient has been followed at the [facility] and Gynecology division of [facility]. Her pregnancy has been complicated by a prior cesarean section for breech presentation, obesity, hypertension, and diabetes.  OB History    Grav Para Term Preterm Abortions TAB SAB Ect Mult Living   6 1 1  4  2   1      Past Medical History  Diagnosis Date  . Hyperlipidemia   . Fibromyalgia   . Asthma   . Sleep apnea     CPAP machine  . Obesity, Class III, BMI 40-49.9 (morbid obesity)   . Allergic rhinitis   . HSV-1 infection 2010  . HSV-2 infection 2010  . Abnormal Pap smear 04/2010  . H/O varicella   . H/O candidiasis    Past Surgical History  Procedure Date  . Refractive surgery   . Ovarian cyst removal 2003    left  . Tonsillectomy     age 7  . Wisdom tooth extraction 2009   Family History: family history includes Asthma in her father, mother, and sister; Diabetes in her father; Hyperlipidemia in her mother; Hypertension in her father and mother; and Mental illness in her sister. Social History:  reports that she has never smoked. She has never used smokeless tobacco. She reports that she does not drink alcohol or use illicit drugs.   Drug allergies: No known drug allergies    Last menstrual period 01/17/2011.  Physical exam:  Weight 330 pounds  Chest: Clear Heart: Regular rate and rhythm Abdomen: Gravid and nontender Extremities: Within normal limits Neurologic exam: Grossly normal Cervix: Closed and long on last exam  Prenatal labs: ABO, Rh: O/Positive/-- (01/29 0000) Antibody: Negative (01/29 0000) Rubella:   RPR: NON REAC (06/14 1520)  HBsAg: Negative (01/29 0000)  HIV: Non-reactive (01/29 0000)  GBS: POSITIVE (07/30 1026)   Assessment/Plan: 38-1/[redacted] week gestation [facility] October 30, 2011) Prior cesarean section Breech  presentation Massive obesity Hypertension Diabetes Asthma Desires sterilization  The patient plans a repeat cesarean section and bilateral tubal ligation.  She understands the indications for surgical procedure.  She accepts the risk of, but not limited to, anesthetic complications, bleeding, infections, and possible damage to the surrounding organs.  She understands that there is a small but real failure rate associated with tubal sterilization (17 per 1000).   Tina Flores 10/15/2011, 7:43 PM

## 2011-10-16 ENCOUNTER — Encounter (HOSPITAL_COMMUNITY)
Admission: RE | Admit: 2011-10-16 | Discharge: 2011-10-16 | Disposition: A | Discharge status: Home / Self Care | Payer: PRIVATE HEALTH INSURANCE | Source: Ambulatory Visit | Attending: Obstetrics and Gynecology | Admitting: Obstetrics and Gynecology

## 2011-10-16 ENCOUNTER — Other Ambulatory Visit (HOSPITAL_COMMUNITY): Payer: PRIVATE HEALTH INSURANCE

## 2011-10-16 ENCOUNTER — Encounter (HOSPITAL_COMMUNITY): Payer: Self-pay

## 2011-10-16 VITALS — BP 130/80 | Ht 67.0 in | Wt 357.0 lb

## 2011-10-16 DIAGNOSIS — Q270 Congenital absence and hypoplasia of umbilical artery: Secondary | ICD-10-CM

## 2011-10-16 DIAGNOSIS — Z98891 History of uterine scar from previous surgery: Secondary | ICD-10-CM

## 2011-10-16 DIAGNOSIS — O24919 Unspecified diabetes mellitus in pregnancy, unspecified trimester: Secondary | ICD-10-CM

## 2011-10-16 HISTORY — DX: Shortness of breath: R06.02

## 2011-10-16 LAB — CBC
HCT: 31.6 % — ABNORMAL LOW (ref 36.0–46.0)
MCH: 25.6 pg — ABNORMAL LOW (ref 26.0–34.0)
MCHC: 31.3 g/dL (ref 30.0–36.0)
MCV: 81.7 fL (ref 78.0–100.0)
Platelets: 275 10*3/uL (ref 150–400)
RDW: 17.6 % — ABNORMAL HIGH (ref 11.5–15.5)
WBC: 6.4 10*3/uL (ref 4.0–10.5)

## 2011-10-16 LAB — SURGICAL PCR SCREEN
MRSA, PCR: NEGATIVE
Staphylococcus aureus: NEGATIVE

## 2011-10-16 LAB — BASIC METABOLIC PANEL
Calcium: 9.2 mg/dL (ref 8.4–10.5)
Creatinine, Ser: 0.63 mg/dL (ref 0.50–1.10)
GFR calc Af Amer: >90 mL/min (ref >90)
GFR calc non Af Amer: >90 mL/min (ref >90)
Sodium: 134 mEq/L — ABNORMAL LOW (ref 135–145)

## 2011-10-16 LAB — TYPE AND SCREEN: ABO/RH(D): O POS

## 2011-10-16 NOTE — Pre-Procedure Instructions (Addendum)
Specific orders from Dr. Sherron Ales are in place..On instruction sheet... re insulin for night before surgery and day of surgery.

## 2011-10-16 NOTE — Patient Instructions (Addendum)
Your procedure is scheduled on:10/20/11  Enter through the Main Entrance at :0915 am Pick up desk phone and dial [phone] and inform us of your arrival.  Please call [phone] if you have any problems the morning of surgery.  Remember: Do not eat after midnight MONDAY Do not drink after:6:30 AM ON TUESDAY  Take these meds the morning of surgery with a sip of water: BP meds, bring inhaler, hold Metformin for 24 hrs prior to surgery, take usual dose insulin night before surgery, take 10 units Levemir  morning of surgery.  DO NOT wear jewelry, eye make-up, lipstick,body lotion, or dark fingernail polish. Do not shave for 48 hours prior to surgery.  If you are to be admitted after surgery, leave suitcase in car until your room has been assigned. Patients discharged on the day of surgery will not be allowed to drive home.   Remember to use your Hibiclens as instructed.

## 2011-10-19 ENCOUNTER — Ambulatory Visit (INDEPENDENT_AMBULATORY_CARE_PROVIDER_SITE_OTHER): Payer: PRIVATE HEALTH INSURANCE | Admitting: Obstetrics and Gynecology

## 2011-10-19 ENCOUNTER — Ambulatory Visit (INDEPENDENT_AMBULATORY_CARE_PROVIDER_SITE_OTHER): Payer: PRIVATE HEALTH INSURANCE

## 2011-10-19 VITALS — BP 116/80 | Wt 359.0 lb

## 2011-10-19 DIAGNOSIS — O0990 Supervision of high risk pregnancy, unspecified, unspecified trimester: Secondary | ICD-10-CM

## 2011-10-19 MED ORDER — DEXTROSE 5 % IV SOLN
3.0000 g | INTRAVENOUS | Status: AC
  Administered 2011-10-20: 3 g via INTRAVENOUS
  Filled 2011-10-19: qty 3000

## 2011-10-19 NOTE — Progress Notes (Signed)
Pt has no concerns today 

## 2011-10-19 NOTE — Progress Notes (Signed)
[redacted]w[redacted]d Ultrasound: Single gestation, breech, normal fluid, biophysical profile 8 out 8. Ready for C-section tomorrow.  Risk and benefits reviewed. Dr. Stefano Gaul

## 2011-10-20 ENCOUNTER — Encounter (HOSPITAL_COMMUNITY): Payer: Self-pay

## 2011-10-20 ENCOUNTER — Inpatient Hospital Stay (HOSPITAL_COMMUNITY)
Admission: RE | Admit: 2011-10-20 | Discharge: 2011-10-23 | DRG: 765 | Disposition: A | Discharge status: Home / Self Care | Payer: PRIVATE HEALTH INSURANCE | Source: Ambulatory Visit | Attending: Obstetrics and Gynecology | Admitting: Obstetrics and Gynecology

## 2011-10-20 ENCOUNTER — Inpatient Hospital Stay (HOSPITAL_COMMUNITY): Payer: PRIVATE HEALTH INSURANCE | Admitting: Anesthesiology

## 2011-10-20 ENCOUNTER — Encounter (HOSPITAL_COMMUNITY): Payer: Self-pay | Admitting: Anesthesiology

## 2011-10-20 ENCOUNTER — Encounter (HOSPITAL_COMMUNITY)
Admission: RE | Disposition: A | Discharge status: Home / Self Care | Payer: Self-pay | Source: Ambulatory Visit | Attending: Obstetrics and Gynecology

## 2011-10-20 DIAGNOSIS — Z302 Encounter for sterilization: Secondary | ICD-10-CM

## 2011-10-20 DIAGNOSIS — O34219 Maternal care for unspecified type scar from previous cesarean delivery: Secondary | ICD-10-CM

## 2011-10-20 DIAGNOSIS — Z98891 History of uterine scar from previous surgery: Secondary | ICD-10-CM | POA: Diagnosis not present

## 2011-10-20 DIAGNOSIS — O2432 Unspecified pre-existing diabetes mellitus in childbirth: Secondary | ICD-10-CM

## 2011-10-20 DIAGNOSIS — O321XX0 Maternal care for breech presentation, not applicable or unspecified: Secondary | ICD-10-CM | POA: Diagnosis present

## 2011-10-20 DIAGNOSIS — E669 Obesity, unspecified: Secondary | ICD-10-CM | POA: Diagnosis present

## 2011-10-20 DIAGNOSIS — Q270 Congenital absence and hypoplasia of umbilical artery: Secondary | ICD-10-CM

## 2011-10-20 DIAGNOSIS — J45909 Unspecified asthma, uncomplicated: Secondary | ICD-10-CM

## 2011-10-20 DIAGNOSIS — O1002 Pre-existing essential hypertension complicating childbirth: Secondary | ICD-10-CM | POA: Diagnosis present

## 2011-10-20 DIAGNOSIS — E119 Type 2 diabetes mellitus without complications: Secondary | ICD-10-CM | POA: Diagnosis present

## 2011-10-20 DIAGNOSIS — O24919 Unspecified diabetes mellitus in pregnancy, unspecified trimester: Secondary | ICD-10-CM

## 2011-10-20 LAB — GLUCOSE, RANDOM: Glucose, Bld: 107 mg/dL — ABNORMAL HIGH (ref 70–99)

## 2011-10-20 LAB — GLUCOSE, CAPILLARY
Glucose-Capillary: 129 mg/dL — ABNORMAL HIGH (ref 70–99)
Glucose-Capillary: 109 mg/dL — ABNORMAL HIGH (ref 70–99)
Glucose-Capillary: 182 mg/dL — ABNORMAL HIGH (ref 70–99)

## 2011-10-20 SURGERY — Surgical Case
Anesthesia: Spinal | Site: Abdomen | Laterality: Bilateral | Wound class: Clean Contaminated

## 2011-10-20 MED ORDER — FENTANYL CITRATE 0.05 MG/ML IJ SOLN
INTRAMUSCULAR | Status: AC
  Filled 2011-10-20: qty 2

## 2011-10-20 MED ORDER — KETOROLAC TROMETHAMINE 30 MG/ML IJ SOLN: 30.0000 mg | Freq: Four times a day (QID) | INTRAMUSCULAR | Status: AC | PRN

## 2011-10-20 MED ORDER — SENNOSIDES-DOCUSATE SODIUM 8.6-50 MG PO TABS
2.0000 | ORAL_TABLET | Freq: Every day | ORAL | Status: DC
  Administered 2011-10-20 – 2011-10-21 (×2): 2 via ORAL

## 2011-10-20 MED ORDER — DIPHENHYDRAMINE HCL 50 MG/ML IJ SOLN: 25.0000 mg | INTRAMUSCULAR | Status: DC | PRN

## 2011-10-20 MED ORDER — KETOROLAC TROMETHAMINE 60 MG/2ML IM SOLN
60.0000 mg | Freq: Once | INTRAMUSCULAR | Status: AC | PRN
  Administered 2011-10-20: 60 mg via INTRAMUSCULAR

## 2011-10-20 MED ORDER — METOCLOPRAMIDE HCL 5 MG/ML IJ SOLN: 10.0000 mg | Freq: Three times a day (TID) | INTRAMUSCULAR | Status: DC | PRN

## 2011-10-20 MED ORDER — DIPHENHYDRAMINE HCL 25 MG PO CAPS: 25.0000 mg | ORAL_CAPSULE | Freq: Four times a day (QID) | ORAL | Status: DC | PRN

## 2011-10-20 MED ORDER — DIBUCAINE 1 % RE OINT: 1.0000 "application " | TOPICAL_OINTMENT | RECTAL | Status: DC | PRN

## 2011-10-20 MED ORDER — ONDANSETRON HCL 4 MG PO TABS: 4.0000 mg | ORAL_TABLET | ORAL | Status: DC | PRN

## 2011-10-20 MED ORDER — NALBUPHINE HCL 10 MG/ML IJ SOLN
5.0000 mg | INTRAMUSCULAR | Status: DC | PRN
  Filled 2011-10-20: qty 1

## 2011-10-20 MED ORDER — SCOPOLAMINE 1 MG/3DAYS TD PT72
1.0000 | MEDICATED_PATCH | Freq: Once | TRANSDERMAL | Status: DC
  Administered 2011-10-20: 1.5 mg via TRANSDERMAL

## 2011-10-20 MED ORDER — MORPHINE SULFATE 0.5 MG/ML IJ SOLN
INTRAMUSCULAR | Status: AC
  Filled 2011-10-20: qty 10

## 2011-10-20 MED ORDER — SIMETHICONE 80 MG PO CHEW
80.0000 mg | CHEWABLE_TABLET | Freq: Three times a day (TID) | ORAL | Status: DC
  Administered 2011-10-20 – 2011-10-23 (×11): 80 mg via ORAL

## 2011-10-20 MED ORDER — WITCH HAZEL-GLYCERIN EX PADS: 1.0000 "application " | MEDICATED_PAD | CUTANEOUS | Status: DC | PRN

## 2011-10-20 MED ORDER — BUPIVACAINE-EPINEPHRINE 0.5% -1:200000 IJ SOLN
INTRAMUSCULAR | Status: DC | PRN
  Administered 2011-10-20: 10 mL

## 2011-10-20 MED ORDER — BUPIVACAINE HCL (PF) 0.5 % IJ SOLN
INTRAMUSCULAR | Status: AC
  Filled 2011-10-20: qty 30

## 2011-10-20 MED ORDER — MORPHINE SULFATE (PF) 0.5 MG/ML IJ SOLN
INTRAMUSCULAR | Status: DC | PRN
  Administered 2011-10-20: .1 mg via INTRATHECAL
  Administered 2011-10-20: 4.9 mg via INTRAVENOUS

## 2011-10-20 MED ORDER — MEDROXYPROGESTERONE ACETATE 150 MG/ML IM SUSP: 150.0000 mg | INTRAMUSCULAR | Status: DC | PRN

## 2011-10-20 MED ORDER — SODIUM CHLORIDE 0.9 % IJ SOLN: 3.0000 mL | INTRAMUSCULAR | Status: DC | PRN

## 2011-10-20 MED ORDER — MENTHOL 3 MG MT LOZG: 1.0000 | LOZENGE | OROMUCOSAL | Status: DC | PRN

## 2011-10-20 MED ORDER — DIPHENHYDRAMINE HCL 50 MG/ML IJ SOLN: 12.5000 mg | INTRAMUSCULAR | Status: DC | PRN

## 2011-10-20 MED ORDER — POTASSIUM CHLORIDE CRYS ER 20 MEQ PO TBCR
20.0000 meq | EXTENDED_RELEASE_TABLET | Freq: Every day | ORAL | Status: DC
  Administered 2011-10-20 – 2011-10-23 (×4): 20 meq via ORAL
  Filled 2011-10-20 (×5): qty 1

## 2011-10-20 MED ORDER — FLUTICASONE-SALMETEROL 500-50 MCG/DOSE IN AEPB
1.0000 | INHALATION_SPRAY | Freq: Every day | RESPIRATORY_TRACT | Status: DC
  Administered 2011-10-21 – 2011-10-23 (×2): 1 via RESPIRATORY_TRACT
  Filled 2011-10-20: qty 14

## 2011-10-20 MED ORDER — NALOXONE HCL 0.4 MG/ML IJ SOLN: 0.4000 mg | INTRAMUSCULAR | Status: DC | PRN

## 2011-10-20 MED ORDER — FENTANYL CITRATE 0.05 MG/ML IJ SOLN
INTRAMUSCULAR | Status: DC | PRN
  Administered 2011-10-20: 50 ug via INTRAVENOUS
  Administered 2011-10-20: 35 ug via INTRAVENOUS
  Administered 2011-10-20: 15 ug via INTRATHECAL

## 2011-10-20 MED ORDER — ONDANSETRON HCL 4 MG/2ML IJ SOLN: 4.0000 mg | INTRAMUSCULAR | Status: DC | PRN

## 2011-10-20 MED ORDER — LANOLIN HYDROUS EX OINT: 1.0000 "application " | TOPICAL_OINTMENT | CUTANEOUS | Status: DC | PRN

## 2011-10-20 MED ORDER — BUPIVACAINE IN DEXTROSE 0.75-8.25 % IT SOLN
INTRATHECAL | Status: DC | PRN
  Administered 2011-10-20: 1.5 mL via INTRATHECAL

## 2011-10-20 MED ORDER — SODIUM CHLORIDE 0.9 % IV SOLN
1.0000 ug/kg/h | INTRAVENOUS | Status: DC | PRN
  Filled 2011-10-20: qty 2.5

## 2011-10-20 MED ORDER — ALBUTEROL SULFATE HFA 108 (90 BASE) MCG/ACT IN AERS: 2.0000 | INHALATION_SPRAY | Freq: Four times a day (QID) | RESPIRATORY_TRACT | Status: DC | PRN

## 2011-10-20 MED ORDER — ONDANSETRON HCL 4 MG/2ML IJ SOLN
INTRAMUSCULAR | Status: AC
  Filled 2011-10-20: qty 2

## 2011-10-20 MED ORDER — OXYTOCIN 10 UNIT/ML IJ SOLN
INTRAMUSCULAR | Status: AC
  Filled 2011-10-20: qty 4

## 2011-10-20 MED ORDER — KETOROLAC TROMETHAMINE 60 MG/2ML IM SOLN
INTRAMUSCULAR | Status: AC
  Administered 2011-10-20: 60 mg via INTRAMUSCULAR
  Filled 2011-10-20: qty 2

## 2011-10-20 MED ORDER — OXYCODONE-ACETAMINOPHEN 5-325 MG PO TABS
1.0000 | ORAL_TABLET | ORAL | Status: DC | PRN
  Administered 2011-10-21 – 2011-10-22 (×5): 1 via ORAL
  Filled 2011-10-20 (×5): qty 1

## 2011-10-20 MED ORDER — MEASLES, MUMPS & RUBELLA VAC ~~LOC~~ INJ
0.5000 mL | INJECTION | Freq: Once | SUBCUTANEOUS | Status: AC
  Administered 2011-10-23: 0.5 mL via SUBCUTANEOUS
  Filled 2011-10-20 (×2): qty 0.5

## 2011-10-20 MED ORDER — LABETALOL HCL 200 MG PO TABS
200.0000 mg | ORAL_TABLET | Freq: Every day | ORAL | Status: DC
  Administered 2011-10-21 – 2011-10-22 (×2): 200 mg via ORAL
  Filled 2011-10-20 (×2): qty 1

## 2011-10-20 MED ORDER — HYDROCHLOROTHIAZIDE 25 MG PO TABS
25.0000 mg | ORAL_TABLET | Freq: Every day | ORAL | Status: DC
  Administered 2011-10-21 – 2011-10-23 (×3): 25 mg via ORAL
  Filled 2011-10-20 (×4): qty 1

## 2011-10-20 MED ORDER — KETOROLAC TROMETHAMINE 30 MG/ML IJ SOLN
30.0000 mg | Freq: Four times a day (QID) | INTRAMUSCULAR | Status: AC | PRN
  Administered 2011-10-20: 30 mg via INTRAVENOUS
  Filled 2011-10-20: qty 1

## 2011-10-20 MED ORDER — LACTATED RINGERS IV SOLN
INTRAVENOUS | Status: DC
  Administered 2011-10-20 (×5): via INTRAVENOUS

## 2011-10-20 MED ORDER — METFORMIN HCL 500 MG PO TABS
500.0000 mg | ORAL_TABLET | Freq: Three times a day (TID) | ORAL | Status: DC
  Filled 2011-10-20 (×3): qty 1

## 2011-10-20 MED ORDER — ONDANSETRON HCL 4 MG/2ML IJ SOLN
INTRAMUSCULAR | Status: DC | PRN
  Administered 2011-10-20: 4 mg via INTRAVENOUS

## 2011-10-20 MED ORDER — DIPHENHYDRAMINE HCL 25 MG PO CAPS: 25.0000 mg | ORAL_CAPSULE | ORAL | Status: DC | PRN

## 2011-10-20 MED ORDER — FENTANYL CITRATE 0.05 MG/ML IJ SOLN
25.0000 ug | INTRAMUSCULAR | Status: DC | PRN
  Administered 2011-10-20 (×2): 50 ug via INTRAVENOUS

## 2011-10-20 MED ORDER — INSULIN ASPART 100 UNIT/ML ~~LOC~~ SOLN: 24.0000 [IU] | Freq: Three times a day (TID) | SUBCUTANEOUS | Status: DC

## 2011-10-20 MED ORDER — MEPERIDINE HCL 25 MG/ML IJ SOLN: 6.2500 mg | INTRAMUSCULAR | Status: DC | PRN

## 2011-10-20 MED ORDER — INSULIN DETEMIR 100 UNIT/ML ~~LOC~~ SOLN: 48.0000 [IU] | Freq: Two times a day (BID) | SUBCUTANEOUS | Status: DC

## 2011-10-20 MED ORDER — PRENATAL MULTIVITAMIN CH
1.0000 | ORAL_TABLET | Freq: Every day | ORAL | Status: DC
  Administered 2011-10-20 – 2011-10-23 (×4): 1 via ORAL
  Filled 2011-10-20 (×4): qty 1

## 2011-10-20 MED ORDER — TETANUS-DIPHTH-ACELL PERTUSSIS 5-2.5-18.5 LF-MCG/0.5 IM SUSP
0.5000 mL | Freq: Once | INTRAMUSCULAR | Status: AC
  Administered 2011-10-21: 0.5 mL via INTRAMUSCULAR
  Filled 2011-10-20: qty 0.5

## 2011-10-20 MED ORDER — IBUPROFEN 600 MG PO TABS
600.0000 mg | ORAL_TABLET | Freq: Four times a day (QID) | ORAL | Status: DC
  Administered 2011-10-21 – 2011-10-23 (×10): 600 mg via ORAL
  Filled 2011-10-20 (×4): qty 1

## 2011-10-20 MED ORDER — SIMETHICONE 80 MG PO CHEW: 80.0000 mg | CHEWABLE_TABLET | ORAL | Status: DC | PRN

## 2011-10-20 MED ORDER — FENTANYL CITRATE 0.05 MG/ML IJ SOLN
INTRAMUSCULAR | Status: AC
  Administered 2011-10-20: 50 ug via INTRAVENOUS
  Filled 2011-10-20: qty 2

## 2011-10-20 MED ORDER — ZOLPIDEM TARTRATE 5 MG PO TABS: 5.0000 mg | ORAL_TABLET | Freq: Every evening | ORAL | Status: DC | PRN

## 2011-10-20 MED ORDER — SCOPOLAMINE 1 MG/3DAYS TD PT72
1.0000 | MEDICATED_PATCH | Freq: Once | TRANSDERMAL | Status: DC
  Filled 2011-10-20: qty 1

## 2011-10-20 MED ORDER — OXYTOCIN 40 UNITS IN LACTATED RINGERS INFUSION - SIMPLE MED: 62.5000 mL/h | INTRAVENOUS | Status: AC

## 2011-10-20 MED ORDER — INSULIN DETEMIR 100 UNIT/ML ~~LOC~~ SOLN
8.0000 [IU] | Freq: Every day | SUBCUTANEOUS | Status: DC
  Administered 2011-10-21: 8 [IU] via SUBCUTANEOUS
  Filled 2011-10-20: qty 10

## 2011-10-20 MED ORDER — SCOPOLAMINE 1 MG/3DAYS TD PT72
MEDICATED_PATCH | TRANSDERMAL | Status: AC
  Administered 2011-10-20: 1.5 mg via TRANSDERMAL
  Filled 2011-10-20: qty 1

## 2011-10-20 MED ORDER — LACTATED RINGERS IV SOLN
INTRAVENOUS | Status: DC
  Administered 2011-10-21: via INTRAVENOUS

## 2011-10-20 MED ORDER — ONDANSETRON HCL 4 MG/2ML IJ SOLN: 4.0000 mg | Freq: Three times a day (TID) | INTRAMUSCULAR | Status: DC | PRN

## 2011-10-20 MED ORDER — OXYTOCIN 10 UNIT/ML IJ SOLN
40.0000 [IU] | INTRAVENOUS | Status: DC | PRN
  Administered 2011-10-20: 40 [IU] via INTRAVENOUS

## 2011-10-20 MED ORDER — DEXTROSE IN LACTATED RINGERS 5 % IV SOLN: INTRAVENOUS | Status: DC

## 2011-10-20 MED ORDER — BUPIVACAINE-EPINEPHRINE (PF) 0.5% -1:200000 IJ SOLN
INTRAMUSCULAR | Status: AC
Start: 2011-10-20 — End: 2011-10-20
  Filled 2011-10-20: qty 10

## 2011-10-20 MED ORDER — IBUPROFEN 600 MG PO TABS
600.0000 mg | ORAL_TABLET | Freq: Four times a day (QID) | ORAL | Status: DC | PRN
  Filled 2011-10-20 (×6): qty 1

## 2011-10-20 MED ORDER — METFORMIN HCL ER 500 MG PO TB24
500.0000 mg | ORAL_TABLET | Freq: Two times a day (BID) | ORAL | Status: DC
  Administered 2011-10-20 – 2011-10-23 (×6): 500 mg via ORAL
  Filled 2011-10-20 (×8): qty 1

## 2011-10-20 SURGICAL SUPPLY — 41 items
CHLORAPREP W/TINT 26ML (MISCELLANEOUS) ×2 IMPLANT
CLOTH BEACON ORANGE TIMEOUT ST (SAFETY) ×2 IMPLANT
CONTAINER PREFILL 10% NBF 15ML (MISCELLANEOUS) ×2 IMPLANT
DRAIN JACKSON PRT FLT 7MM (DRAIN) IMPLANT
DRESSING TELFA 8X3 (GAUZE/BANDAGES/DRESSINGS) ×2 IMPLANT
DRSG COVADERM 4X10 (GAUZE/BANDAGES/DRESSINGS) ×2 IMPLANT
ELECT REM PT RETURN 9FT ADLT (ELECTROSURGICAL) ×2
ELECTRODE REM PT RTRN 9FT ADLT (ELECTROSURGICAL) ×1 IMPLANT
EVACUATOR SILICONE 100CC (DRAIN) IMPLANT
EXTRACTOR VACUUM M CUP 4 TUBE (SUCTIONS) IMPLANT
GAUZE SPONGE 4X4 12PLY STRL LF (GAUZE/BANDAGES/DRESSINGS) ×4 IMPLANT
GLOVE BIOGEL PI IND STRL 8.5 (GLOVE) ×1 IMPLANT
GLOVE BIOGEL PI INDICATOR 8.5 (GLOVE) ×1
GLOVE ECLIPSE 8.0 STRL XLNG CF (GLOVE) ×4 IMPLANT
GOWN PREVENTION PLUS LG XLONG (DISPOSABLE) ×4 IMPLANT
GOWN PREVENTION PLUS XXLARGE (GOWN DISPOSABLE) ×2 IMPLANT
KIT ABG SYR 3ML LUER SLIP (SYRINGE) IMPLANT
NDL HYPO 25X1 1.5 SAFETY (NEEDLE) ×1 IMPLANT
NEEDLE HYPO 25X1 1.5 SAFETY (NEEDLE) ×2 IMPLANT
NEEDLE HYPO 25X5/8 SAFETYGLIDE (NEEDLE) ×2 IMPLANT
PACK C SECTION WH (CUSTOM PROCEDURE TRAY) ×2 IMPLANT
PAD ABD 7.5X8 STRL (GAUZE/BANDAGES/DRESSINGS) ×4 IMPLANT
PAD OB MATERNITY 4.3X12.25 (PERSONAL CARE ITEMS) ×2 IMPLANT
RINGERS IRRIG 1000ML POUR BTL (IV SOLUTION) IMPLANT
SLEEVE SCD COMPRESS KNEE MED (MISCELLANEOUS) IMPLANT
STAPLER VISISTAT 35W (STAPLE) IMPLANT
SUT MNCRL AB 3-0 PS2 27 (SUTURE) ×2 IMPLANT
SUT PLAIN 0 NONE (SUTURE) IMPLANT
SUT SILK 3 0 FS 1X18 (SUTURE) IMPLANT
SUT VIC AB 0 CT1 27 (SUTURE) ×4
SUT VIC AB 0 CT1 27XBRD ANBCTR (SUTURE) ×2 IMPLANT
SUT VIC AB 2-0 CTX 36 (SUTURE) ×5 IMPLANT
SUT VIC AB 3-0 CT1 27 (SUTURE)
SUT VIC AB 3-0 CT1 TAPERPNT 27 (SUTURE) IMPLANT
SUT VIC AB 3-0 SH 27 (SUTURE)
SUT VIC AB 3-0 SH 27X BRD (SUTURE) IMPLANT
SYR CONTROL 10ML LL (SYRINGE) ×2 IMPLANT
TAPE CLOTH SURG 4X10 WHT LF (GAUZE/BANDAGES/DRESSINGS) ×1 IMPLANT
TOWEL OR 17X24 6PK STRL BLUE (TOWEL DISPOSABLE) ×4 IMPLANT
TRAY FOLEY CATH 14FR (SET/KITS/TRAYS/PACK) ×2 IMPLANT
WATER STERILE IRR 1000ML POUR (IV SOLUTION) ×2 IMPLANT

## 2011-10-20 NOTE — Anesthesia Postprocedure Evaluation (Signed)
  Anesthesia Post-op Note  Patient: Tina Flores  Procedure(s) Performed: Procedure(s) (LRB): CESAREAN SECTION WITH BILATERAL TUBAL LIGATION (Bilateral)  Patient Location: PACU and Mother/Baby  Anesthesia Type: Spinal  Level of Consciousness: awake, alert  and oriented  Airway and Oxygen Therapy: Patient Spontanous Breathing  Post-op Pain: mild  Post-op Assessment: Post-op Vital signs reviewed, Patient's Cardiovascular Status Stable, Respiratory Function Stable, No signs of Nausea or vomiting and Pain level controlled  Post-op Vital Signs: stable  Complications: No apparent anesthesia complications

## 2011-10-20 NOTE — Transfer of Care (Signed)
Immediate Anesthesia Transfer of Care Note  Patient: Tina Flores  Procedure(s) Performed: Procedure(s) (LRB): CESAREAN SECTION WITH BILATERAL TUBAL LIGATION (Bilateral)  Patient Location: PACU  Anesthesia Type: Spinal  Level of Consciousness: awake, alert  and oriented  Airway & Oxygen Therapy: Patient Spontanous Breathing  Post-op Assessment: Report given to PACU RN and Post -op Vital signs reviewed and stable  Post vital signs: stable  Complications: No apparent anesthesia complications

## 2011-10-20 NOTE — H&P (Signed)
The patient was interviewed and examined today.  The previously documented history and physical examination was reviewed. There are no changes. The operative procedure was reviewed. The risks and benefits were outlined again. The specific risks include, but are not limited to, anesthetic complications, bleeding, infections, and possible damage to the surrounding organs. The patient's questions were answered.  We are ready to proceed as outlined. The likelihood of the patient achieving the goals of this procedure is very likely.   Latise Dilley Vernon Gildo Crisco, M.D.  

## 2011-10-20 NOTE — Anesthesia Postprocedure Evaluation (Signed)
Anesthesia Post Note  Patient: Tina Flores  Procedure(s) Performed: Procedure(s) (LRB): CESAREAN SECTION WITH BILATERAL TUBAL LIGATION (Bilateral)  Anesthesia type: Spinal  Patient location: PACU  Post pain: Pain level controlled  Post assessment: Post-op Vital signs reviewed  Last Vitals:  Filed Vitals:   10/20/11 1445  BP: 116/62  Pulse: 65  Temp: 36.9 C  Resp: 17    Post vital signs: Reviewed  Level of consciousness: awake  Complications: No apparent anesthesia complications

## 2011-10-20 NOTE — Anesthesia Preprocedure Evaluation (Addendum)
Anesthesia Evaluation  Patient identified by MRN, date of birth, ID band Patient awake    Reviewed: Allergy & Precautions, H&P , NPO status , Patient's Chart, lab work & pertinent test results, reviewed documented beta blocker date and time   History of Anesthesia Complications Negative for: history of anesthetic complications  Airway Mallampati: III TM Distance: >3 FB Neck ROM: full    Dental  (+) Teeth Intact   Pulmonary shortness of breath (for the past two months, attributes to pregnancy, obesity and asthma), with exertion and lying, asthma (worse the past 4 weeks, last hospitalization for asthma was 1993, never ventilated) , sleep apnea (does not use CPAP) ,  breath sounds clear to auscultation        Cardiovascular hypertension, On Home Beta Blockers Rhythm:regular Rate:Normal     Neuro/Psych negative neurological ROS  negative psych ROS   GI/Hepatic negative GI ROS, Neg liver ROS,   Endo/Other  Type 2, Insulin DependentMorbid obesity  Renal/GU negative Renal ROS  negative genitourinary   Musculoskeletal   Abdominal   Peds  Hematology  (+) anemia ,   Anesthesia Other Findings   Reproductive/Obstetrics (+) Pregnancy (h/o prior c/s, for repat and BTL)                          Anesthesia Physical Anesthesia Plan  ASA: III  Anesthesia Plan: Combined Spinal and Epidural   Post-op Pain Management:    Induction:   Airway Management Planned:   Additional Equipment:   Intra-op Plan:   Post-operative Plan:   Informed Consent: I have reviewed the patients History and Physical, chart, labs and discussed the procedure including the risks, benefits and alternatives for the proposed anesthesia with the patient or authorized representative who has indicated his/her understanding and acceptance.   Dental Advisory Given  Plan Discussed with: Surgeon and CRNA  Anesthesia Plan Comments:         Anesthesia Quick Evaluation

## 2011-10-20 NOTE — Addendum Note (Signed)
Addendum  created 10/20/11 1638 by Meloney Feld S Maralee Higuchi, CRNA   Modules edited:Notes Section    

## 2011-10-20 NOTE — Anesthesia Procedure Notes (Signed)
Spinal  Patient location during procedure: OR Start time: 10/20/2011 11:58 AM Staffing Performed by: anesthesiologist  Preanesthetic Checklist Completed: patient identified, site marked, surgical consent, pre-op evaluation, timeout performed, IV checked, risks and benefits discussed and monitors and equipment checked Spinal Block Patient position: sitting Prep: site prepped and draped and DuraPrep Patient monitoring: heart rate, cardiac monitor, continuous pulse ox and blood pressure Approach: midline Location: L3-4 Injection technique: catheter Needle Needle type: Tuohy and Sprotte  Needle gauge: 25 G Needle length: 15 cm Needle insertion depth: 10 cm Catheter type: closed end flexible Catheter size: 19 g Catheter at skin depth: 14 cm Assessment Sensory level: T4 Additional Notes Attempting CSE, unable to reach epidural space with usual length tuohy needle.  Switched to 15 cm tuohy and had dural puncture at 10 cm.  SAB dose given, intrathecal catheter threaded.  Catheter threaded, patient instructed.  Will plan to leave catheter in for 24 hrs to decrease risk of PDPH. Jasmine December, MD

## 2011-10-20 NOTE — Op Note (Signed)
OPERATIVE NOTE  Patient's Name: Tina Flores  Date of Birth: 1976-01-24   Medical Records Number: [id number]   Date of Operation: 10/20/2011   Preoperative diagnosis:  [redacted]w[redacted]d weeks gestation  Prior Cesarean; Desires Sterilization; Morbid Obesity, Hypertension, Diabetes  Desires Repeat Cesarean Section  Desires sterilization  Breech presentation  Postoperative diagnosis:  [redacted]w[redacted]d weeks gestation  Prior Cesarean; Desires Sterilization; Morbid Obesity, Hypertension, Diabetes  Desires Repeat Cesarean Section  Desires sterilization  Complete breech presentation  Procedure:  Repeat low transverse cesarean section  Bilateral tubal sterilization procedure  Surgeon:  Leonard Schwartz, M.D.  Assistant:  Earl Gala, certified nurse midwife  Anesthesia:  Spinal  Disposition:  Tina Flores is a 36 y.o. female who presents at [redacted]w[redacted]d weeks gestation. The patient has been followed at the [facility] obstetrics and gynecology division of [facility] care for women. This pregnancy has been complicated by a prior cesarean section, hypertension, diabetes, asthma, and obesity (weight 359 pounds). The patient desires a repeat cesarean section and sterilization. She understands the indications for her procedure and she accepts the risk of, but not limited to, anesthetic complications, bleeding, infections, and possible damage to the surrounding organs. She understands that there is a small failure rate associated with sterilization (17 per 1000).  Findings:  A female was delivered from a complete breech position. The uterus, fallopian tubes, and ovaries were normal for the gravid state.  Procedure:  The patient was taken to the operating room where a spinal anesthetic was given. The patient's abdomen was prepped with Chloraprep. The perineum was prepped with betadine. A Foley catheter was placed in the bladder. The patient was sterilely draped. The  lower abdomen was injected with half percent Marcaine with epinephrine. A low transverse incision was made in the abdomen and carried sharply through the subcutaneous tissue, the fascia, and the anterior peritoneum. An incision was made in the lower uterine segment. The incision was extended in a low transverse fashion. The membranes were ruptured. The infant was delivered from a complete breech presentation. The cord was clamped and cut. The infant was handed to the awaiting pediatric team. The placenta was removed. The uterine cavity was cleaned of amniotic fluid, clotted blood, and membranes. The uterine incision was closed using a running locking suture of 2-0 Vicryl. An imbricating suture of 2-0 Vicryl was placed. The left fallopian tube was identified and followed to its fimbriated end. A knuckle of tube was made using 2 free ties of 0 plain catgut. The knuckle of tube was excised. Hemostasis was adequate. An identical procedure was carried out on the opposite side. The pelvis was vigorously irrigated. Hemostasis was adequate. The anterior peritoneum and the abdominal musculature were closed using 2-0 Vicryl. The fascia was closed using a running suture of 0 Vicryl followed by 3 interrupted sutures of 0 Vicryl. The subcutaneous layer was closed using interrupted sutures of 2-0 Vicryl. The skin was reapproximated using a subcuticular suture of 3-0 Monocryl. Sponge, needle, and instrument counts were correct on 2 occasions. The estimated blood loss for the procedure was 700 cc. The patient tolerated her procedure well. She was transported to the recovery room in stable condition. The infant was taken to the full-term nursery in stable condition. The placenta, portion of the left tube, and portion of right tube were sent to pathology.  Leonard Schwartz, M.D.

## 2011-10-20 NOTE — Progress Notes (Signed)
Subjective: Diabetes consultation Patient has been followed for her diabetes since her pregnancy when she was started on insulin. Previously had been relatively poorly controlled with regimen off Amaryl and metformin. She has had type 2 diabetes for about 4 years. During her pregnancy she has been treated aggressively with Lantus insulin twice a day along with mealtime NovoLog insulin. Blood sugars have been usually reasonably well-controlled although not at ideal levels. Her blood sugar after delivery is 126 and she has not resumed her diet as yet. CBG (last 3)   Basename 10/20/11 1337 10/20/11 0957  GLUCAP 128* 129*     Objective: Vital signs in last 24 hours: Temp:  [97.9 F (36.6 C)-98.5 F (36.9 C)] 98.5 F (36.9 C) (08/20 1445) Pulse Rate:  [62-94] 65  (08/20 1445) Resp:  [15-20] 17  (08/20 1445) BP: (98-190)/(54-93) 116/62 mmHg (08/20 1445) SpO2:  [97 %-100 %] 97 % (08/20 1445) Weight change:     Intake/Output from previous day:   Intake/Output this shift: Total I/O In: 2900 [I.V.:2900] Out: 1100 [Urine:400; Blood:700]  General appearance: alert  Lab Results: No results found for this basename: WBC:2,HGB:2,HCT:2,PLT:2 in the last 72 hours BMET No results found for this basename: NA:2,K:2,CL:2,CO2:2,GLUCOSE:2,BUN:2,CREATININE:2,CALCIUM:2 in the last 72 hours  Studies/Results: No results found.  Medications:  Scheduled:   .  ceFAZolin (ANCEF) IV  3 g Intravenous On Call to OR  . scopolamine  1 patch Transdermal Once    Assessment/Plan:  DIABETES, type II, obese with significant insulin requirement during pregnancy. She most likely will not require insulin for the first 48 hours and will continue on metformin alone. Her fasting blood sugar is significantly over 125 will start her on low-dose Levemir as a basal insulin and add mealtime insulin if needed. We'll need to review her diabetes control on a day-to-day basis.      LOS: 0 days   Tina Flores 10/20/2011,  2:54 PM

## 2011-10-21 DIAGNOSIS — Z98891 History of uterine scar from previous surgery: Secondary | ICD-10-CM | POA: Diagnosis not present

## 2011-10-21 LAB — GLUCOSE, CAPILLARY
Glucose-Capillary: 128 mg/dL — ABNORMAL HIGH (ref 70–99)
Glucose-Capillary: 163 mg/dL — ABNORMAL HIGH (ref 70–99)

## 2011-10-21 LAB — CBC
HCT: 28.9 % — ABNORMAL LOW (ref 36.0–46.0)
Platelets: 254 10*3/uL (ref 150–400)
RBC: 3.48 MIL/uL — ABNORMAL LOW (ref 3.87–5.11)
RDW: 18.1 % — ABNORMAL HIGH (ref 11.5–15.5)
WBC: 7.7 10*3/uL (ref 4.0–10.5)

## 2011-10-21 MED ORDER — INSULIN ASPART 100 UNIT/ML ~~LOC~~ SOLN
5.0000 [IU] | Freq: Three times a day (TID) | SUBCUTANEOUS | Status: DC
  Administered 2011-10-21: 5 [IU] via SUBCUTANEOUS

## 2011-10-21 NOTE — Progress Notes (Signed)
Subjective: Postpartum Day 1: Cesarean Delivery due to repeat Chronic hypertension, Diabetes Patient reports no problems voiding.  Up ad lib, no syncope or dizziness.  Would like to stop potassium (takes q day per Dr. Lucianne Muss). Feeding:  Breastfeeding Contraceptive: BTL  Objective: Vital signs in last 24 hours: Temp:  [97.9 F (36.6 C)-98.5 F (36.9 C)] 97.9 F (36.6 C) (08/21 0815) Pulse Rate:  [62-102] 73  (08/21 0815) Resp:  [15-20] 20  (08/21 0815) BP: (98-156)/(54-83) 142/82 mmHg (08/21 0815) SpO2:  [96 %-100 %] 100 % (08/21 0815) Weight:  [359 lb (162.841 kg)] 359 lb (162.841 kg) (08/20 1542)  Filed Vitals:   10/20/11 2215 10/21/11 0005 10/21/11 0415 10/21/11 0815  BP: 131/75 121/72 137/83 142/82  Pulse: 71 79 93 73  Temp: 98.5 F (36.9 C) 98.2 F (36.8 C) 98.3 F (36.8 C) 97.9 F (36.6 C)  TempSrc: Oral Oral Oral Oral  Resp: 18 18 18 20   Weight:      SpO2:  99% 100% 100%   CBG (last 3)   Basename 10/21/11 1138 10/21/11 0633 10/20/11 2137  GLUCAP 157* 164* 182*   Seen by Dr. Lucianne Muss yesterday for evaluation of diabetes--note advises that diabetes control will be reviewed day-to-day.   Physical Exam:  General: alert Lochia: appropriate Uterine Fundus: firm Incision: Dressing CDI DVT Evaluation: No evidence of DVT seen on physical exam. Negative Homan's sign. JP drain:   NA   Basename 10/21/11 0520  HGB 9.1*  HCT 28.9*  Pre-delivery Hgb 9.9  Assessment/Plan: Status post Cesarean section. Chronic hypertension--on Labetalol 200 mg po q day Type 2 diabetes--on Metformin 500 mg po BID and insulin Levemir 8 units SQ q day Chronic anemia   Doing well postoperatively.  Dr. Lucianne Muss will continue to see and manage diabetes. Referred patient to discuss K+ question with Dr. Lucianne Muss. Continue current care.  Nigel Bridgeman 10/21/2011, 12:16 PM

## 2011-10-21 NOTE — Addendum Note (Signed)
Addendum  created 10/21/11 1629 by Marrissa Dai A Eyvonne Burchfield, CRNA   Modules edited:Anesthesia Events    

## 2011-10-21 NOTE — Progress Notes (Signed)
Called Dr. Lucianne Muss due to pt's increased blood sugar. New orders given. Dr. Lucianne Muss said he would stop by to see pt tomorrow.

## 2011-10-21 NOTE — Addendum Note (Signed)
Addendum  created 10/21/11 1629 by Algis Greenhouse, CRNA   Modules edited:Anesthesia Events

## 2011-10-22 LAB — GLUCOSE, CAPILLARY
Glucose-Capillary: 137 mg/dL — ABNORMAL HIGH (ref 70–99)
Glucose-Capillary: 141 mg/dL — ABNORMAL HIGH (ref 70–99)
Glucose-Capillary: 128 mg/dL — ABNORMAL HIGH (ref 70–99)

## 2011-10-22 LAB — TYPE AND SCREEN
ABO/RH(D): O POS
Antibody Screen: NEGATIVE
Unit division: 0

## 2011-10-22 MED ORDER — INSULIN ASPART 100 UNIT/ML ~~LOC~~ SOLN
4.0000 [IU] | Freq: Three times a day (TID) | SUBCUTANEOUS | Status: DC
  Administered 2011-10-22 – 2011-10-23 (×3): 4 [IU] via SUBCUTANEOUS

## 2011-10-22 MED ORDER — INSULIN ASPART 100 UNIT/ML ~~LOC~~ SOLN
4.0000 [IU] | Freq: Three times a day (TID) | SUBCUTANEOUS | Status: DC
  Administered 2011-10-22 (×2): via SUBCUTANEOUS

## 2011-10-22 MED ORDER — INSULIN ASPART 100 UNIT/ML ~~LOC~~ SOLN: 4.0000 [IU] | Freq: Once | SUBCUTANEOUS | Status: DC

## 2011-10-22 MED ORDER — LABETALOL HCL 200 MG PO TABS
200.0000 mg | ORAL_TABLET | Freq: Two times a day (BID) | ORAL | Status: DC
  Administered 2011-10-22 – 2011-10-23 (×2): 200 mg via ORAL
  Filled 2011-10-22 (×4): qty 1

## 2011-10-22 MED ORDER — INSULIN DETEMIR 100 UNIT/ML ~~LOC~~ SOLN
11.0000 [IU] | Freq: Every day | SUBCUTANEOUS | Status: DC
  Administered 2011-10-22 – 2011-10-23 (×2): 11 [IU] via SUBCUTANEOUS

## 2011-10-22 NOTE — Progress Notes (Signed)
Subjective: Postpartum Day 2: Cesarean Delivery - scheduled RLTCS.  Patient reports tolerating PO, + flatus and no problems voiding.   no complaints, up ad lib without syncope Pain well controlled with po meds BF at intervals. Baby is jaundiced and on Bili- blanket and needs fluids. NICU have baby supplementing with  Formula. Mood stable, bonding well Contraception: BTL  With RLTCS surgery  Objective: Vital signs in last 24 hours: Temp:  [97.9 F (36.6 C)-98.1 F (36.7 C)] 97.9 F (36.6 C) (08/22 0635) Pulse Rate:  [75-84] 75  (08/22 0635) Resp:  [18-20] 20  (08/22 0635) BP: (123-160)/(79-84) 145/81 mmHg (08/22 0635) SpO2:  [100 %] 100 % (08/21 1215)  Physical Exam:  General: alert, cooperative and no distress Breasts: normal Heart: RRR, B/P settled this morning. Patient states that she was upset last night and B/P was elevated. Patient feeling calmer this morning about the baby's condition. Lungs: CTAB Abdomen: BS x4 no BM as yet. May desire Stool softener later today ifn o BM. Uterine Fundus: firm Incision: healing well, no significant drainage, open to air. Lochia: appropriate DVT Evaluation: No evidence of DVT seen on physical exam. Negative Homan's sign. Calf/Ankle edema is present.   Basename 10/21/11 0520  HGB 9.1*  HCT 28.9*    Assessment/Plan: Status post Cesarean section. Doing well postoperatively.  Continue current care.  Melynda Krzywicki, CNM. 10/22/2011, 9:48 AM

## 2011-10-22 NOTE — Progress Notes (Signed)
Subjective: High sugars:  CBG (last 3)   Basename 10/22/11 0530 10/21/11 2356 10/21/11 2120  GLUCAP 144* 163* 128*   She was put on ac Novlog, sugars improving     Objective: Vital signs in last 24 hours: Temp:  [97.9 F (36.6 C)-98.1 F (36.7 C)] 97.9 F (36.6 C) (08/22 0635) Pulse Rate:  [73-84] 75  (08/22 0635) Resp:  [18-20] 20  (08/22 0635) BP: (123-160)/(79-84) 145/81 mmHg (08/22 0635) SpO2:  [100 %] 100 % (08/21 1215) Weight change:     Intake/Output from previous day: 08/21 0701 - 08/22 0700 In: -  Out: 1050 [Urine:1050] Intake/Output this shift:    General appearance: alert  Lab Results:  Basename 10/21/11 0520  WBC 7.7  HGB 9.1*  HCT 28.9*  PLT 254   BMET  Basename 10/20/11 1605  NA --  K --  CL --  CO2 --  GLUCOSE 107*  BUN --  CREATININE --  CALCIUM --    Studies/Results: No results found.  Medications:  Scheduled:   . Fluticasone-Salmeterol  1 puff Inhalation Daily  . hydrochlorothiazide  25 mg Oral Daily  . ibuprofen  600 mg Oral Q6H  . insulin aspart  5 Units Subcutaneous TID WC  . insulin detemir  8 Units Subcutaneous Daily  . labetalol  200 mg Oral Daily  . measles, mumps and rubella vaccine  0.5 mL Subcutaneous Once  . metFORMIN  500 mg Oral BID WC  . potassium chloride SA  20 mEq Oral Daily  . prenatal multivitamin  1 tablet Oral Daily  . scopolamine  1 patch Transdermal Once  . senna-docusate  2 tablet Oral QHS  . simethicone  80 mg Oral TID PC & HS  . TDaP  0.5 mL Intramuscular Once    Assessment/Plan:  LOS: 2 days   Go up on Levemir, call in am for further changes  Tina Flores 10/22/2011, 8:10 AM

## 2011-10-23 MED ORDER — OXYCODONE-ACETAMINOPHEN 5-325 MG PO TABS: 1.0000 | ORAL_TABLET | ORAL | Status: AC | PRN

## 2011-10-23 MED ORDER — INSULIN ASPART 100 UNIT/ML ~~LOC~~ SOLN
4.0000 [IU] | Freq: Once | SUBCUTANEOUS | Status: AC
  Administered 2011-10-23: 4 [IU] via SUBCUTANEOUS

## 2011-10-23 MED ORDER — IBUPROFEN 800 MG PO TABS: 800.0000 mg | ORAL_TABLET | Freq: Three times a day (TID) | ORAL | Status: AC | PRN

## 2011-10-23 MED ORDER — LABETALOL HCL 100 MG PO TABS: 200.0000 mg | ORAL_TABLET | Freq: Two times a day (BID) | ORAL | Status: DC

## 2011-10-23 NOTE — Progress Notes (Signed)
Steelman, CNM returned call regarding Fasting AM CBG of 152. Phone order to give 4 units of Novolog and take a CBG before breakfast and then call Doc on call with CBG value for further instructions.

## 2011-10-23 NOTE — Progress Notes (Signed)
Subjective: Postpartum Day 3: Cesarean Delivery and tubal ligation Patient reports tolerating PO and no problems voiding.    Objective: Vital signs in last 24 hours: Temp:  [97.8 F (36.6 C)-98.2 F (36.8 C)] 98.2 F (36.8 C) (08/23 0512) Pulse Rate:  [76-111] 111  (08/23 1023) Resp:  [18-20] 18  (08/23 0512) BP: (125-157)/(72-86) 157/80 mmHg (08/23 1023)  Physical Exam:  General: alert and no distress Lochia: appropriate Uterine Fundus: firm Incision: healing well DVT Evaluation: No evidence of DVT seen on physical exam.   Basename 10/21/11 0520  HGB 9.1*  HCT 28.9*    Assessment/Plan: Status post Cesarean section. Doing well postoperatively.  Discharge to home today. Patient will see her endocrinologist and her cardiologist next week. She will continue her insulin and high blood pressure medications. Labetalol 200 mg twice each day.  She will return to see [place] in 6 weeks.  [name] 10/23/2011, 2:32 PM

## 2011-10-28 ENCOUNTER — Ambulatory Visit (HOSPITAL_COMMUNITY): Payer: PRIVATE HEALTH INSURANCE

## 2011-10-29 ENCOUNTER — Other Ambulatory Visit: Payer: Self-pay | Admitting: Obstetrics and Gynecology

## 2011-10-29 NOTE — H&P (Signed)
10/20/11  [name] is a 36 y.o. female presenting for cesarean section and tubal ligation. The patient has been followed at the [facility] and Gynecology division of [facility]. Her pregnancy has been complicated by a prior cesarean section for breech presentation, obesity, hypertension, and diabetes.  OB History    Grav  Para  Term  Preterm  Abortions  TAB  SAB  Ect  Mult  Living    6  1  1   4   2    1       Past Medical History   Diagnosis  Date   .  Hyperlipidemia    .  Fibromyalgia    .  Asthma    .  Sleep apnea      CPAP machine   .  Obesity, Class III, BMI 40-49.9 (morbid obesity)    .  Allergic rhinitis    .  HSV-1 infection  2010   .  HSV-2 infection  2010   .  Abnormal Pap smear  04/2010   .  H/O varicella    .  H/O candidiasis     Past Surgical History   Procedure  Date   .  Refractive surgery    .  Ovarian cyst removal  2003     left   .  Tonsillectomy      age 63   .  Wisdom tooth extraction  2009    Family History: family history includes Asthma in her father, mother, and sister; Diabetes in her father; Hyperlipidemia in her mother; Hypertension in her father and mother; and Mental illness in her sister.  Social History: reports that she has never smoked. She has never used smokeless tobacco. She reports that she does not drink alcohol or use illicit drugs.  Drug allergies: No known drug allergies   Last menstrual period 01/17/2011.  Physical exam:  Weight 330 pounds  Chest: Clear  Heart: Regular rate and rhythm  Abdomen: Gravid and nontender  Extremities: Within normal limits  Neurologic exam: Grossly normal  Cervix: Closed and long on last exam  Prenatal labs:  ABO, Rh: O/Positive/-- (01/29 0000)  Antibody: Negative (01/29 0000)  Rubella:  RPR: NON REAC (06/14 1520)  HBsAg: Negative (01/29 0000)  HIV: Non-reactive (01/29 0000)  GBS: POSITIVE (07/30 1026)  Assessment/Plan:  38-1/[redacted] week gestation [facility]  October 30, 2011)  Prior cesarean section  Breech presentation  Massive obesity  Hypertension  Diabetes  Asthma  Desires sterilization  The patient plans a repeat cesarean section and bilateral tubal ligation. She understands the indications for surgical procedure. She accepts the risk of, but not limited to, anesthetic complications, bleeding, infections, and possible damage to the surrounding organs. She understands that there is a small but real failure rate associated with tubal sterilization (17 per 1000).  Tina Flores  10/15/2011, 7:43 PM

## 2011-10-29 NOTE — Progress Notes (Signed)
Physician Discharge Summary  Patient ID: Tina Flores MRN: [id number] DOB/AGE: [birth year] 36 y.o.  Admit date: (Not on file) Discharge date: 10/29/2011  Admission Diagnoses:  term pg breech  hx of prior C/S plans BTL,  HTN type 2 diabetic                                            Author:  Kirkland Hun, MD  Service:  Obstetrics  Author Type:  Physician   Filed:  10/23/11 1436  Note Time:  10/23/11 1432          Subjective:  Postpartum Day 3: Cesarean Delivery and tubal ligation  Patient reports tolerating PO and no problems voiding.  Objective:  Vital signs in last 24 hours:  Temp: [97.8 F (36.6 C)-98.2 F (36.8 C)] 98.2 F (36.8 C) (08/23 0512)  Pulse Rate: [76-111] 111 (08/23 1023)  Resp: [18-20] 18 (08/23 0512)  BP: (125-157)/(72-86) 157/80 mmHg (08/23 1023)  Physical Exam:  General: alert and no distress  Lochia: appropriate  Uterine Fundus: firm  Incision: healing well  DVT Evaluation: No evidence of DVT seen on physical exam.     Basename  10/21/11 0520     HGB  9.1*     HCT  28.9*     Assessment/Plan:  Status post Cesarean section. Doing well postoperatively.  Discharge to home today. Patient will see her endocrinologist and her cardiologist next week. She will continue her insulin and high blood pressure medications. Labetalol 200 mg twice each day.  She will return to see [place] in 6 weeks.  [name]  10/23/2011, 2:32 PM        Discharge Diagnoses:  Active Problems:  * No active hospital problems. *   Patient Active Problem List  Diagnosis  . AMA (advanced maternal age) multigravida 35+  . BMI 50.0-59.9, adult  . Hypertension  . DM (diabetes mellitus)  .  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 Umbilical cord, single artery and vein  . H/O cesarean section  . Asthma  . Status post repeat low transverse cesarean section  anemia  Discharged Condition: stable  Hospital Course: Repeat C/S BTL, normal involution, f/o by  cardiology and endocrine  Consults: cardiology and endocrine  Significant Diagnostic Studies: labs:  Hemoglobin & Hematocrit     Component Value Date/Time   HGB 9.1* 10/21/2011 0520   HCT 28.9* 10/21/2011 0520     Treatments:   Discharge Exam: Last menstrual period 01/17/2011, unknown if currently breastfeeding. see MD note  Disposition: 01-Home or Self Care  Discharge Orders    Future Appointments: Provider: Department: Dept Phone: Center:   12/02/2011 10:15 AM Kirkland Hun, MD [facility] [phone] None     Med List    Warning       Cannot display discharge medications because this is not an admission.           Current outpatient prescriptions:albuterol (PROVENTIL HFA;VENTOLIN HFA) 108 (90 BASE) MCG/ACT inhaler, Inhale 2 puffs into the lungs every 6 (six) hours as needed. For wheezing, Disp: , Rfl: ;  Fluticasone-Salmeterol (ADVAIR DISKUS) 500-50 MCG/DOSE AEPB, Inhale 1 puff into the lungs daily. Pt has medication at home, Disp: 60 each, Rfl: 0;  hydrochlorothiazide (HYDRODIURIL) 25 MG tablet, Take 25 mg by mouth daily., Disp: , Rfl:  ibuprofen (ADVIL,MOTRIN) 800 MG tablet, Take 1 tablet (800 mg total) by  mouth every 8 (eight) hours as needed for pain., Disp: 50 tablet, Rfl: 1;  insulin aspart (NOVOLOG) 100 UNIT/ML injection, Inject 24-28 Units into the skin 3 (three) times daily before meals. Pt adjusts dose based on meal, Disp: , Rfl:  insulin detemir (LEVEMIR) 100 UNIT/ML injection, Inject 48-56 Units into the skin 2 (two) times daily. 48 units in the am and 56 units at bedtime, Disp: , Rfl: ;  labetalol (NORMODYNE) 100 MG tablet, Take 2 tablets (200 mg total) by mouth 2 (two) times daily., Disp: 60 tablet, Rfl: 6;  labetalol (NORMODYNE) 200 MG tablet, Take 200 mg by mouth daily. , Disp: , Rfl:  metFORMIN (GLUCOPHAGE) 500 MG tablet, Take 500 mg by mouth 3 (three) times daily with meals. , Disp: , Rfl: ;  oxyCODONE-acetaminophen (ROXICET) 5-325 MG per tablet, Take 1  tablet by mouth every 4 (four) hours as needed for pain., Disp: 50 tablet, Rfl: 0;  potassium chloride SA (K-DUR,KLOR-CON) 20 MEQ tablet, Take 20 mEq by mouth daily., Disp: , Rfl:  Prenatal Vit-Fe Fumarate-FA (PRENATAL MULTIVITAMIN) TABS, Take 1 tablet by mouth daily., Disp: , Rfl:    Signed: pt assessment, plan of care, and orders by Dr. Bing Plume on 10/23/2011 [facility], [facility] 10/29/2011, 7:35 PM

## 2011-11-17 NOTE — Discharge Summary (Signed)
Physician Discharge Summary  Patient ID: Tina Flores MRN: [id number] DOB/AGE: 07/10/1975 36 y.o.  Admit date: (Not on file) Discharge date: 10/29/2011  Admission Diagnoses:  term pg breech  hx of prior C/S plans BTL,  HTN type 2 diabetic                                            Author:  Maizey Menendez, MD  Service:  Obstetrics  Author Type:  Physician   Filed:  10/23/11 1436  Note Time:  10/23/11 1432          Subjective:  Postpartum Day 3: Cesarean Delivery and tubal ligation  Patient reports tolerating PO and no problems voiding.  Objective:  Vital signs in last 24 hours:  Temp: [97.8 F (36.6 C)-98.2 F (36.8 C)] 98.2 F (36.8 C) (08/23 0512)  Pulse Rate: [76-111] 111 (08/23 1023)  Resp: [18-20] 18 (08/23 0512)  BP: (125-157)/(72-86) 157/80 mmHg (08/23 1023)  Physical Exam:  General: alert and no distress  Lochia: appropriate  Uterine Fundus: firm  Incision: healing well  DVT Evaluation: No evidence of DVT seen on physical exam.     Basename  10/21/11 0520     HGB  9.1*     HCT  28.9*     Assessment/Plan:  Status post Cesarean section. Doing well postoperatively.  Discharge to home today. Patient will see her endocrinologist and her cardiologist next week. She will continue her insulin and high blood pressure medications. Labetalol 200 mg twice each day.  She will return to see us in 6 weeks.  Shontez Sermon V  10/23/2011, 2:32 PM        Discharge Diagnoses:  Active Problems:  * No active hospital problems. *   Patient Active Problem List  Diagnosis  . AMA (advanced maternal age) multigravida 35+  . BMI 50.0-59.9, adult  . Hypertension  . DM (diabetes mellitus)  .  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 Umbilical cord, single artery and vein  . H/O cesarean section  . Asthma  . Status post repeat low transverse cesarean section  anemia  Discharged Condition: stable  Hospital Course: Repeat C/S BTL, normal involution, f/o by  cardiology and endocrine  Consults: cardiology and endocrine  Significant Diagnostic Studies: labs:  Hemoglobin & Hematocrit     Component Value Date/Time   HGB 9.1* 10/21/2011 0520   HCT 28.9* 10/21/2011 0520     Treatments:   Discharge Exam: Last menstrual period 01/17/2011, unknown if currently breastfeeding. see MD note  Disposition: 01-Home or Self Care  Discharge Orders    Future Appointments: Provider: Department: Dept Phone: Center:   12/02/2011 10:15 AM Deana Krock, MD [facility] [phone] None     Med List    Warning       Cannot display discharge medications because this is not an admission.           Current outpatient prescriptions:albuterol (PROVENTIL HFA;VENTOLIN HFA) 108 (90 BASE) MCG/ACT inhaler, Inhale 2 puffs into the lungs every 6 (six) hours as needed. For wheezing, Disp: , Rfl: ;  Fluticasone-Salmeterol (ADVAIR DISKUS) 500-50 MCG/DOSE AEPB, Inhale 1 puff into the lungs daily. Pt has medication at home, Disp: 60 each, Rfl: 0;  hydrochlorothiazide (HYDRODIURIL) 25 MG tablet, Take 25 mg by mouth daily., Disp: , Rfl:  ibuprofen (ADVIL,MOTRIN) 800 MG tablet, Take 1 tablet (800 mg total) by   mouth every 8 (eight) hours as needed for pain., Disp: 50 tablet, Rfl: 1;  insulin aspart (NOVOLOG) 100 UNIT/ML injection, Inject 24-28 Units into the skin 3 (three) times daily before meals. Pt adjusts dose based on meal, Disp: , Rfl:  insulin detemir (LEVEMIR) 100 UNIT/ML injection, Inject 48-56 Units into the skin 2 (two) times daily. 48 units in the am and 56 units at bedtime, Disp: , Rfl: ;  labetalol (NORMODYNE) 100 MG tablet, Take 2 tablets (200 mg total) by mouth 2 (two) times daily., Disp: 60 tablet, Rfl: 6;  labetalol (NORMODYNE) 200 MG tablet, Take 200 mg by mouth daily. , Disp: , Rfl:  metFORMIN (GLUCOPHAGE) 500 MG tablet, Take 500 mg by mouth 3 (three) times daily with meals. , Disp: , Rfl: ;  oxyCODONE-acetaminophen (ROXICET) 5-325 MG per tablet, Take 1  tablet by mouth every 4 (four) hours as needed for pain., Disp: 50 tablet, Rfl: 0;  potassium chloride SA (K-DUR,KLOR-CON) 20 MEQ tablet, Take 20 mEq by mouth daily., Disp: , Rfl:  Prenatal Vit-Fe Fumarate-FA (PRENATAL MULTIVITAMIN) TABS, Take 1 tablet by mouth daily., Disp: , Rfl:    Signed: pt assessment, plan of care, and orders by Dr. Stinger on 10/23/2011 KREBSBACH, MARY 10/29/2011, 7:35 PM   

## 2011-12-02 ENCOUNTER — Ambulatory Visit (INDEPENDENT_AMBULATORY_CARE_PROVIDER_SITE_OTHER): Payer: PRIVATE HEALTH INSURANCE | Admitting: Obstetrics and Gynecology

## 2011-12-02 ENCOUNTER — Encounter: Payer: Self-pay | Admitting: Obstetrics and Gynecology

## 2011-12-02 NOTE — Progress Notes (Signed)
HISTORY OF PRESENT ILLNESS  [name] is a 36 y.o. year old female,[id number], who presents for a postpartum visit. She had a repeat cesarean section and tubal ligation.  See details below.   Subjective:  She is doing very well at this time.  Objective:  BP 138/82  Temp 97.9 F (36.6 C) (Oral)  Wt 317 lb (143.79 kg)  Breastfeeding? No   General: no distress GI: incision: clean, dry and intact and soft and nontender  External genitalia: normal general appearance Vaginal: normal without tenderness, induration or masses Cervix: nontender Adnexa: normal bimanual exam Uterus: normal size shape and consistency  Assessment:  Doing well status post cesarean section and tubal ligation  Plan:  Return to normal activities.  The patient will return to work in one month.  The patient will see her endocrinologist to manage her diabetes.  Return to office in 3 month(s) for her annual exam.   Leonard Schwartz M.D.  12/02/2011 2:05 PM    Date of delivery: 10/20/11 Female Name: Tina Flores Vaginal delivery:no Cesarean section:yes Tubal ligation:yes GDM:no, pt has type 2 diabetes Breast Feeding:no Bottle Feeding:yes Post-Partum Blues:no Abnormal pap:no Normal GU function: yes Normal GI function:yes Returning to work:yes EPDS: 2

## 2012-05-20 ENCOUNTER — Other Ambulatory Visit: Payer: Self-pay | Admitting: Obstetrics and Gynecology

## 2012-05-21 LAB — GC/CHLAMYDIA PROBE AMP: CT Probe RNA: NEGATIVE

## 2012-12-21 ENCOUNTER — Ambulatory Visit (INDEPENDENT_AMBULATORY_CARE_PROVIDER_SITE_OTHER): Payer: PRIVATE HEALTH INSURANCE | Admitting: Endocrinology

## 2012-12-21 ENCOUNTER — Other Ambulatory Visit: Payer: Self-pay | Admitting: *Deleted

## 2012-12-21 ENCOUNTER — Encounter: Payer: Self-pay | Admitting: Endocrinology

## 2012-12-21 VITALS — BP 124/82 | HR 89 | Temp 98.2°F | Resp 14 | Ht 66.0 in | Wt 334.4 lb

## 2012-12-21 DIAGNOSIS — IMO0001 Reserved for inherently not codable concepts without codable children: Secondary | ICD-10-CM

## 2012-12-21 LAB — GLUCOSE, POCT (MANUAL RESULT ENTRY): POC Glucose: 269 mg/dl — AB (ref 70–99)

## 2012-12-21 MED ORDER — CANAGLIFLOZIN-METFORMIN HCL 150-500 MG PO TABS: 150.0000 mg | ORAL_TABLET | Freq: Two times a day (BID) | ORAL | Status: DC

## 2012-12-21 NOTE — Progress Notes (Signed)
Patient ID: Tina Flores, female   DOB: [birth year], 37 y.o.   MRN: [id number]   Tina Flores is an 37 y.o. female.   Reason for Appointment: Diabetes follow-up   History of Present Illness   Diagnosis: Type 2 DIABETES MELITUS, date of diagnosis 2005   Previous history: She was started on insulin within a few years of diagnosis She has generally been requiring large doses of insulin for control especially since her pregnancy During her pregnancy with doing consistently well with her insulin doses and diet she had been able to get her A1c down to 6.5 Previously has tried Victoza and did not benefit significantly along with having side effects of nausea Overall has had significant difficulty with not being able to lose weight  Recent history: She has not been seen in followup for several months Also has been quite noncompliant with all aspects of self-care. She lost her meter about 2 months ago and did not get another one She also has been taking much less Levemir insulin than previously prescribed and not clear why She is not exercising on waking up or to lose weight On her last visit she was given Actos in addition to her metformin to help with insulin resistance Also had been previously given prescription for Invokana but she was not able to take this because of insurance denial     Oral hypoglycemic drugs: Metformin ER 1500 mg a day, Actos 15 mg daily      Side effects from medications: None Insulin regimen: Humalog 20 units twice a day, Levemir 22 units in the morning and 20 8 at night         Proper timing of medications in relation to meals: Yes.          Monitors blood glucose: Once a day.    Glucometer: One Touch ultra         Blood Glucose readings: None available as she has not had a meter for a couple of months  Hypoglycemia frequency:  none        Meals: 3 meals per day.          Physical activity: exercise: none, apparently has no time because of working 2  jobs           Dietician visit: Most recent:?             The last HbgA1c report is unknown  Wt Readings from Last 3 Encounters:  12/21/12 334 lb 6.4 oz (151.683 kg)  12/02/11 317 lb (143.79 kg)  10/20/11 359 lb (162.841 kg)    LABS:      Medication List       This list is accurate as of: 12/21/12  3:54 PM.  Always use your most recent med list.               albuterol 108 (90 BASE) MCG/ACT inhaler  Commonly known as:  PROVENTIL HFA;VENTOLIN HFA  Inhale 2 puffs into the lungs every 6 (six) hours as needed. For wheezing     Fluticasone-Salmeterol 500-50 MCG/DOSE Aepb  Commonly known as:  ADVAIR DISKUS  Inhale 1 puff into the lungs daily. Pt has medication at home     HUMALOG KWIKPEN 100 UNIT/ML Sopn  Generic drug:  insulin lispro  22 Units 3 (three) times daily.     hydrochlorothiazide 25 MG tablet  Commonly known as:  HYDRODIURIL  Take 25 mg by mouth daily.     insulin aspart 100 UNIT/ML  injection  Commonly known as:  novoLOG  Inject 24-28 Units into the skin 3 (three) times daily before meals. Pt adjusts dose based on meal     insulin detemir 100 UNIT/ML injection  Commonly known as:  LEVEMIR  Inject 48-56 Units into the skin 2 (two) times daily. 48 units in the am and 56 units at bedtime     labetalol 200 MG tablet  Commonly known as:  NORMODYNE  Take 200 mg by mouth 2 (two) times daily.     labetalol 100 MG tablet  Commonly known as:  NORMODYNE  Take 2 tablets (200 mg total) by mouth 2 (two) times daily.     lovastatin 20 MG tablet  Commonly known as:  MEVACOR     metFORMIN 500 MG tablet  Commonly known as:  GLUCOPHAGE  Take 500 mg by mouth 3 (three) times daily with meals.     ONE TOUCH ULTRA TEST test strip  Generic drug:  glucose blood     pioglitazone 15 MG tablet  Commonly known as:  ACTOS  15 mg.     potassium chloride SA 20 MEQ tablet  Commonly known as:  K-DUR,KLOR-CON  Take 20 mEq by mouth daily.     prenatal multivitamin Tabs  tablet  Take 1 tablet by mouth daily.     ramipril 2.5 MG capsule  Commonly known as:  ALTACE     SYMBICORT 160-4.5 MCG/ACT inhaler  Generic drug:  budesonide-formoterol     triamterene-hydrochlorothiazide 37.5-25 MG per tablet  Commonly known as:  MAXZIDE-25        Allergies: No Known Allergies  Past Medical History  Diagnosis Date  . Hyperlipidemia   . Obesity, Class III, BMI 40-49.9 (morbid obesity)   . Allergic rhinitis   . HSV-1 infection 2010  . HSV-2 infection 2010  . Abnormal Pap smear 04/2010  . H/O varicella   . H/O candidiasis   . Shortness of breath   . Asthma     inhaler use 3-4 x per day since pregnant  . Sleep apnea     CPAP machine  . Diabetes mellitus     Past Surgical History  Procedure Laterality Date  . Refractive surgery    . Ovarian cyst removal  2003    left  . Tonsillectomy      age 34  . Wisdom tooth extraction  2009  . Cesarean section      Family History  Problem Relation Age of Onset  . Asthma Mother   . Hyperlipidemia Mother   . Hypertension Mother   . Asthma Father   . Diabetes Father   . Hypertension Father   . Asthma Sister   . Mental illness Sister     Social History:  reports that she has never smoked. She has never used smokeless tobacco. She reports that she does not drink alcohol or use illicit drugs.  Review of Systems:  Hypertension:  she is a combination of Maxzide to my ramipril low dose and labetalol with good control. Also no recent edema  Lipids: No labs recently done, has been treated with Mevacor 20 mg orally      Examination:   BP 124/82  Pulse 89  Temp(Src) 98.2 F (36.8 C)  Resp 14  Ht 5\' 6"  (1.676 m)  Wt 334 lb 6.4 oz (151.683 kg)  BMI 54 kg/m2  SpO2 97%  LMP 12/12/2012  Body mass index is 54 kg/(m^2).    ASSESSMENT/ PLAN::   Diabetes  type 2  Blood glucose control is again poor and this is partly from her arbitrarily reducing her Levemir insulin to less than half the usual dose Also  she is also not compliant with followup, glucose monitoring, exercise or diet Her blood sugar is 269 today and A1c will be checked  Although her insurance company had apparently denied the Invokana that was being tried on her own her last visit it appears on the e-prescribe program that the combination with metformin will be covered Since she has failed other treatments including Victoza she will be tried on this combination She will increase her insulin as directed and discussed titrating Levemir especially bedtime dose on fasting reading New  glucose monitor given today and she will start monitoring blood sugars at various times as directed She was instructed on how to use the Verio monitor For now she will continue Actos and the same dose of mealtime insulin Reminded her to start [facility] during her the day, even around the block once or twice  HYPERTENSION: Very well controlled now with Maxzide and labetalol as well as low-dose ramipril Because of her starting Invokana will have her stop Maxzide, follow blood pressure readings and only use it if she has swelling or increased blood pressure  Counseling time over 50% of today's 25 minute visit  Jermel Artley 12/21/2012, 3:54 PM   LABS:  Office Visit on 12/21/2012  Component Date Value Range Status  . Hemoglobin A1C 12/21/2012 9.2* 4.6 - 6.5 % Final   Glycemic Control Guidelines for People with Diabetes:Non Diabetic:  <6%Goal of Therapy: <7%Additional Action Suggested:  >8%   . Sodium 12/21/2012 137  135 - 145 mEq/L Final  . Potassium 12/21/2012 3.8  3.5 - 5.1 mEq/L Final  . Chloride 12/21/2012 99  96 - 112 mEq/L Final  . CO2 12/21/2012 30  19 - 32 mEq/L Final  . Glucose, Bld 12/21/2012 226* 70 - 99 mg/dL Final  . BUN 97/35/3299 11  6 - 23 mg/dL Final  . Creatinine, Ser 12/21/2012 0.9  0.4 - 1.2 mg/dL Final  . Total Bilirubin 12/21/2012 0.4  0.3 - 1.2 mg/dL Final  . Alkaline Phosphatase 12/21/2012 89  39 - 117 U/L Final  . AST  12/21/2012 13  0 - 37 U/L Final  . ALT 12/21/2012 10  0 - 35 U/L Final  . Total Protein 12/21/2012 7.6  6.0 - 8.3 g/dL Final  . Albumin 24/26/8341 3.5  3.5 - 5.2 g/dL Final  . Calcium 96/22/2979 9.1  8.4 - 10.5 mg/dL Final  . GFR 89/21/1941 92.69  >60.00 mL/min Final  . Cholesterol 12/21/2012 168  0 - 200 mg/dL Final   ATP III Classification       Desirable:  < 200 mg/dL               Borderline High:  200 - 239 mg/dL          High:  > = 740 mg/dL  . Triglycerides 12/21/2012 158.0* 0.0 - 149.0 mg/dL Final   Normal:  <814 mg/dLBorderline High:  150 - 199 mg/dL  . HDL 12/21/2012 43.60  >39.00 mg/dL Final  . VLDL 48/18/5631 31.6  0.0 - 40.0 mg/dL Final  . LDL Cholesterol 12/21/2012 93  0 - 99 mg/dL Final  . Total CHOL/HDL Ratio 12/21/2012 4   Final                  Men          Women1/2 Average  Risk     3.4          3.3Average Risk          5.0          4.42X Average Risk          9.6          7.13X Average Risk          15.0          11.0                      . Microalb, Ur 12/21/2012 0.7  0.0 - 1.9 mg/dL Final  . Creatinine,U 16/12/9602 160.5   Final  . Microalb Creat Ratio 12/21/2012 0.4  0.0 - 30.0 mg/g Final  . Color, Urine 12/21/2012 LT. YELLOW  Yellow;Lt. Yellow Final  . APPearance 12/21/2012 CLEAR  Clear Final  . Specific Gravity, Urine 12/21/2012 1.025  1.000-1.030 Final  . pH 12/21/2012 6.0  5.0 - 8.0 Final  . Total Protein, Urine 12/21/2012 NEGATIVE  Negative Final  . Urine Glucose 12/21/2012 >=1000  Negative Final  . Ketones, ur 12/21/2012 NEGATIVE  Negative Final  . Bilirubin Urine 12/21/2012 NEGATIVE  Negative Final  . Hgb urine dipstick 12/21/2012 SMALL  Negative Final  . Urobilinogen, UA 12/21/2012 0.2  0.0 - 1.0 Final  . Leukocytes, UA 12/21/2012 NEGATIVE  Negative Final  . Nitrite 12/21/2012 NEGATIVE  Negative Final  . WBC, UA 12/21/2012 0-2/hpf  0-2/hpf Final  . RBC / HPF 12/21/2012 0-2/hpf  0-2/hpf Final  . Squamous Epithelial / LPF 12/21/2012 Few(5-10/hpf)   Rare(0-4/hpf) Final  . Bacteria, UA 12/21/2012 Many(>50/hpf)  None Final  . POC Glucose 12/21/2012 269* 70 - 99 mg/dl Final

## 2012-12-21 NOTE — Patient Instructions (Addendum)
Invokamet twice daily with 1 metformin  Levemir 32 in am and 50 at night for now  Please check blood sugars at least half the time about 2 hours after any meal and as directed on waking up. Please bring blood sugar monitor to each visit  Walk daily

## 2012-12-22 LAB — URINALYSIS, ROUTINE W REFLEX MICROSCOPIC
Ketones, ur: NEGATIVE
Specific Gravity, Urine: 1.025 (ref 1.000–1.030)
Total Protein, Urine: NEGATIVE
Urine Glucose: >=1000
Urobilinogen, UA: 0.2 (ref 0.0–1.0)
pH: 6 (ref 5.0–8.0)

## 2012-12-22 LAB — LIPID PANEL
Cholesterol: 168 mg/dL (ref 0–200)
HDL: 43.6 mg/dL (ref >39.00)
LDL Cholesterol: 93 mg/dL (ref 0–99)
VLDL: 31.6 mg/dL (ref 0.0–40.0)

## 2012-12-22 LAB — COMPREHENSIVE METABOLIC PANEL
Alkaline Phosphatase: 89 U/L (ref 39–117)
Creatinine, Ser: 0.9 mg/dL (ref 0.4–1.2)
Glucose, Bld: 226 mg/dL — ABNORMAL HIGH (ref 70–99)
Sodium: 137 mEq/L (ref 135–145)
Total Bilirubin: 0.4 mg/dL (ref 0.3–1.2)
Total Protein: 7.6 g/dL (ref 6.0–8.3)

## 2012-12-22 LAB — HEMOGLOBIN A1C: Hgb A1c MFr Bld: 9.2 % — ABNORMAL HIGH (ref 4.6–6.5)

## 2012-12-22 LAB — MICROALBUMIN / CREATININE URINE RATIO: Creatinine,U: 160.5 mg/dL

## 2012-12-26 ENCOUNTER — Other Ambulatory Visit: Payer: Self-pay | Admitting: *Deleted

## 2012-12-26 MED ORDER — METFORMIN HCL 500 MG PO TABS: 500.0000 mg | ORAL_TABLET | Freq: Three times a day (TID) | ORAL | Status: DC

## 2012-12-26 MED ORDER — PIOGLITAZONE HCL 15 MG PO TABS: 15.0000 mg | ORAL_TABLET | Freq: Every day | ORAL | Status: DC

## 2012-12-26 MED ORDER — INSULIN LISPRO 100 UNIT/ML (KWIKPEN): 22.0000 [IU] | PEN_INJECTOR | Freq: Three times a day (TID) | SUBCUTANEOUS | Status: DC

## 2012-12-26 MED ORDER — GLUCOSE BLOOD VI STRP: ORAL_STRIP | Status: DC

## 2012-12-26 MED ORDER — INSULIN DETEMIR 100 UNIT/ML ~~LOC~~ SOLN: 22.0000 [IU] | Freq: Two times a day (BID) | SUBCUTANEOUS | Status: DC

## 2012-12-26 MED ORDER — CANAGLIFLOZIN-METFORMIN HCL 150-500 MG PO TABS: 150.0000 mg | ORAL_TABLET | Freq: Two times a day (BID) | ORAL | Status: DC

## 2013-01-13 ENCOUNTER — Ambulatory Visit (INDEPENDENT_AMBULATORY_CARE_PROVIDER_SITE_OTHER): Payer: PRIVATE HEALTH INSURANCE | Admitting: Endocrinology

## 2013-01-13 ENCOUNTER — Encounter: Payer: Self-pay | Admitting: Endocrinology

## 2013-01-13 VITALS — BP 120/74 | HR 73 | Temp 98.3°F | Resp 12 | Ht 67.0 in | Wt 337.9 lb

## 2013-01-13 DIAGNOSIS — I10 Essential (primary) hypertension: Secondary | ICD-10-CM

## 2013-01-13 DIAGNOSIS — IMO0001 Reserved for inherently not codable concepts without codable children: Secondary | ICD-10-CM

## 2013-01-13 DIAGNOSIS — E785 Hyperlipidemia, unspecified: Secondary | ICD-10-CM

## 2013-01-13 NOTE — Patient Instructions (Signed)
Please check blood sugars at least half the time about 2 hours after any meal and as directed on waking up. Please bring blood sugar monitor to each visit  Reduce Humalog to 16-18 for low carb meals  Levemir same unless low or high in am or before supper  Walk daily

## 2013-01-13 NOTE — Progress Notes (Signed)
Patient ID: Tina Flores, female   DOB: 28-Jan-1976, 37 y.o.   MRN: [id number]   Tina Flores is an 37 y.o. female.   Reason for Appointment: Diabetes follow-up   History of Present Illness   Diagnosis: Type 2 DIABETES MELITUS, date of diagnosis 2005   Previous history: She was started on insulin within a few years of diagnosis She has generally been requiring large doses of insulin for control especially since her pregnancy During her pregnancy with doing consistently well with her insulin doses and diet she had been able to get her A1c down to 6.5 Previously has tried Victoza and did not benefit significantly along with having side effects of nausea Overall has had significant difficulty with not being able to lose weight  Recent history: On her last visit she had been quite noncompliant with all aspects of self-care. She lost her meter and was not monitoring She also has been taking much less Levemir insulin than previously prescribed and not clear why She had been on Actos and metformin but was unable to take Invokana because of insurance denial  However she was able to get the Invokana/metformin combination on her insurance. She did try this last month but it caused nausea and malaise and she did not take it again until about a week ago She is taking this now twice a day and has no nausea or side effects Her blood sugars are significantly better, previously in 200+ range Last night with very little carbohydrates she had a glucose of 74 only at fasting is better today also She has been taking her Levemir as directed this time twice a day also     Oral hypoglycemic drugs: Invokamet 150/500 twice a day, Actos 15 mg daily      Side effects from medications: Nausea from Victoza Insulin regimen: Humalog 20 units twice a day, Levemir 22 units in the morning and 28 at night         Proper timing of medications in relation to meals: Yes.          Monitors blood glucose: Once a  day.    Glucometer: One Touch ultra         Blood Glucose readings: 127-176, last night 74, previously at bedtime 212-238  Hypoglycemia frequency:  none        Meals: 3 meals per day.          Physical activity: exercise: none, still has not started walking           Dietician visit: Most recent:?              Wt Readings from Last 3 Encounters:  01/13/13 337 lb 14.4 oz (153.27 kg)  12/21/12 334 lb 6.4 oz (151.683 kg)  12/02/11 317 lb (143.79 kg)   Diabetes labs: Lab Results  Component Value Date   HGBA1C 9.2* 12/21/2012   Lab Results  Component Value Date   MICROALBUR 0.7 12/21/2012   LDLCALC 93 12/21/2012   CREATININE 0.9 12/21/2012       Medication List       This list is accurate as of: 01/13/13  2:17 PM.  Always use your most recent med list.               albuterol 108 (90 BASE) MCG/ACT inhaler  Commonly known as:  PROVENTIL HFA;VENTOLIN HFA  Inhale 2 puffs into the lungs every 6 (six) hours as needed. For wheezing     Canagliflozin-Metformin HCl 150-500  MG Tabs  Take 150 mg by mouth 2 (two) times daily.     Fluticasone-Salmeterol 500-50 MCG/DOSE Aepb  Commonly known as:  ADVAIR DISKUS  Inhale 1 puff into the lungs daily. Pt has medication at home     glucose blood test strip  Commonly known as:  ONETOUCH VERIO  Use as instructed to check blood sugars once a day     hydrochlorothiazide 25 MG tablet  Commonly known as:  HYDRODIURIL  Take 25 mg by mouth daily.     insulin detemir 100 UNIT/ML injection  Commonly known as:  LEVEMIR  Inject 0.22-0.28 mLs (22-28 Units total) into the skin 2 (two) times daily. 22 units in the am and 28 units at bedtime     insulin lispro 100 UNIT/ML Sopn  Commonly known as:  HUMALOG KWIKPEN  Inject 22 Units into the skin 3 (three) times daily.     labetalol 200 MG tablet  Commonly known as:  NORMODYNE  Take 200 mg by mouth 2 (two) times daily.     labetalol 100 MG tablet  Commonly known as:  NORMODYNE  Take 2 tablets  (200 mg total) by mouth 2 (two) times daily.     lovastatin 20 MG tablet  Commonly known as:  MEVACOR     metFORMIN 500 MG tablet  Commonly known as:  GLUCOPHAGE  Take 1 tablet (500 mg total) by mouth 3 (three) times daily with meals.     pioglitazone 15 MG tablet  Commonly known as:  ACTOS  Take 1 tablet (15 mg total) by mouth daily.     potassium chloride SA 20 MEQ tablet  Commonly known as:  K-DUR,KLOR-CON  Take 20 mEq by mouth daily.     prenatal multivitamin Tabs tablet  Take 1 tablet by mouth daily.     ramipril 2.5 MG capsule  Commonly known as:  ALTACE     SYMBICORT 160-4.5 MCG/ACT inhaler  Generic drug:  budesonide-formoterol     triamterene-hydrochlorothiazide 37.5-25 MG per tablet  Commonly known as:  MAXZIDE-25        Allergies: No Known Allergies  Past Medical History  Diagnosis Date  . Hyperlipidemia   . Obesity, Class III, BMI 40-49.9 (morbid obesity)   . Allergic rhinitis   . HSV-1 infection 2010  . HSV-2 infection 2010  . Abnormal Pap smear 04/2010  . H/O varicella   . H/O candidiasis   . Shortness of breath   . Asthma     inhaler use 3-4 x per day since pregnant  . Sleep apnea     CPAP machine  . Diabetes mellitus     Past Surgical History  Procedure Laterality Date  . Refractive surgery    . Ovarian cyst removal  2003    left  . Tonsillectomy      age 25  . Wisdom tooth extraction  2009  . Cesarean section      Family History  Problem Relation Age of Onset  . Asthma Mother   . Hyperlipidemia Mother   . Hypertension Mother   . Asthma Father   . Diabetes Father   . Hypertension Father   . Asthma Sister   . Mental illness Sister     Social History:  reports that she has never smoked. She has never used smokeless tobacco. She reports that she does not drink alcohol or use illicit drugs.  Review of Systems:  Hypertension:  she is only on ramipril low dose and labetalol with  good control. Maxzide was stopped because of  starting Invokana and has no recent edema  Lipids: Last LDL 93, has been treated with Mevacor 20 mg orally      Examination:   BP 120/74  Pulse 73  Temp(Src) 98.3 F (36.8 C)  Resp 12  Ht 5\' 7"  (1.702 m)  Wt 337 lb 14.4 oz (153.27 kg)  BMI 52.91 kg/m2  SpO2 97%  LMP 01/09/2013  Body mass index is 52.91 kg/(m^2).    ASSESSMENT/ PLAN::   Diabetes type 2   Blood glucose control is significantly better with adding Invokana to her regimen and going up on her insulin dose to prior levels She appears to be better compliant with diet also and cutting back on carbohydrates Has not lost any weight yet but her glucose readings are progressively getting better Currently tolerating Invokamet with 500 mg metformin twice a day and will continue  For now also she will continue Actos and the same dose of Levemir She will cut back on her Humalog when eating low carbohydrate meals Reminded her to start [facility] during her the day, even around the block once or twice  HYPERTENSION: Very well controlled now with 2.5 mg ramipril and labetalol  No lightheadedness and will continue the same regimen   Tina Flores 01/13/2013, 2:17 PM

## 2013-03-15 ENCOUNTER — Other Ambulatory Visit: Payer: PRIVATE HEALTH INSURANCE

## 2013-03-15 ENCOUNTER — Other Ambulatory Visit (INDEPENDENT_AMBULATORY_CARE_PROVIDER_SITE_OTHER): Payer: PRIVATE HEALTH INSURANCE

## 2013-03-15 DIAGNOSIS — E1165 Type 2 diabetes mellitus with hyperglycemia: Principal | ICD-10-CM

## 2013-03-15 DIAGNOSIS — IMO0001 Reserved for inherently not codable concepts without codable children: Secondary | ICD-10-CM

## 2013-03-15 LAB — BASIC METABOLIC PANEL
BUN: 16 mg/dL (ref 6–23)
CHLORIDE: 97 meq/L (ref 96–112)
CO2: 25 meq/L (ref 19–32)
Calcium: 9.2 mg/dL (ref 8.4–10.5)
Creatinine, Ser: 1.1 mg/dL (ref 0.4–1.2)
GFR: 69.37 mL/min (ref >60.00)
GLUCOSE: 224 mg/dL — AB (ref 70–99)
POTASSIUM: 3.3 meq/L — AB (ref 3.5–5.1)
SODIUM: 132 meq/L — AB (ref 135–145)

## 2013-03-15 LAB — HEMOGLOBIN A1C: Hgb A1c MFr Bld: 7.9 % — ABNORMAL HIGH (ref 4.6–6.5)

## 2013-03-17 ENCOUNTER — Encounter: Payer: Self-pay | Admitting: Endocrinology

## 2013-03-17 ENCOUNTER — Other Ambulatory Visit: Payer: Self-pay | Admitting: *Deleted

## 2013-03-17 ENCOUNTER — Ambulatory Visit (INDEPENDENT_AMBULATORY_CARE_PROVIDER_SITE_OTHER): Payer: PRIVATE HEALTH INSURANCE | Admitting: Endocrinology

## 2013-03-17 VITALS — BP 126/78 | HR 98 | Temp 98.2°F | Resp 14 | Ht 66.0 in | Wt 332.4 lb

## 2013-03-17 DIAGNOSIS — I10 Essential (primary) hypertension: Secondary | ICD-10-CM

## 2013-03-17 DIAGNOSIS — IMO0001 Reserved for inherently not codable concepts without codable children: Secondary | ICD-10-CM

## 2013-03-17 DIAGNOSIS — E876 Hypokalemia: Secondary | ICD-10-CM

## 2013-03-17 DIAGNOSIS — E785 Hyperlipidemia, unspecified: Secondary | ICD-10-CM

## 2013-03-17 DIAGNOSIS — E1165 Type 2 diabetes mellitus with hyperglycemia: Principal | ICD-10-CM

## 2013-03-17 MED ORDER — INSULIN PEN NEEDLE 32G X 4 MM MISC: Status: DC

## 2013-03-17 MED ORDER — POTASSIUM CHLORIDE ER 10 MEQ PO TBCR: 10.0000 meq | EXTENDED_RELEASE_TABLET | Freq: Every day | ORAL | Status: DC

## 2013-03-17 NOTE — Progress Notes (Signed)
Patient ID: Tina Flores, female   DOB: 11/19/1975, 38 y.o.   MRN: [id number]   Reason for Appointment: Diabetes follow-up   History of Present Illness   Diagnosis: Type 2 DIABETES MELITUS, date of diagnosis 2005   Previous history: She was started on insulin within a few years of initial diagnosis She has generally been requiring large doses of insulin for control especially since her pregnancy During her pregnancy with doing consistently well with her insulin doses and diet she had been able to get her A1c down to 6.5 Previously has tried Victoza and did not benefit significantly along with having side effects of nausea Overall has had significant difficulty with not being able to lose weight  Recent history: In 10/22 she had poor control because of being  noncompliant with all aspects of self-care.  She also has been taking much less Levemir insulin than previously prescribed and not clear why She had been on Actos and metformin previously She was finally was able to get the Invokana/metformin combination product on her insurance.  She is taking this more consistently now without any side effects although now takes it at lunch and dinner with her meals Her A1c is improved but still relatively high and she did have a high reading in the lab May not be getting adequate coverage of glucose during the day when she is not checking it She has been taking her Levemir as directed this time twice a day along with coverage for lunch and dinner     Oral hypoglycemic drugs: Invokamet 150/500 twice a day, Actos 15 mg daily      Side effects from medications: Nausea from Victoza Insulin regimen: Humalog 20 units twice a day 15 min ac , Levemir 22 units in the morning and 28 at night         Proper timing of medications in relation to meals: Yes.          Monitors blood glucose: Once a day.    Glucometer: One Touch ultra         Blood Glucose readings: occ higher 170s  PREMEAL Breakfast  Lunch Dinner Bedtime Overall  Glucose range: 100-138   140   Mean/median:        POST-MEAL PC Breakfast PC Lunch PC Dinner  Glucose range:  224 (lab)   Mean/median:      Hypoglycemia frequency:  none        Meals:  2-3 meals per day.  usually eating only grapefruit for breakfast         Physical activity: exercise: none, still has not been motivated to start           Dietician visit: Most recent:?              Wt Readings from Last 3 Encounters:  03/17/13 332 lb 6.4 oz (150.776 kg)  01/13/13 337 lb 14.4 oz (153.27 kg)  12/21/12 334 lb 6.4 oz (151.683 kg)   Diabetes labs: Lab Results  Component Value Date   HGBA1C 7.9* 03/15/2013   HGBA1C 9.2* 12/21/2012   Lab Results  Component Value Date   MICROALBUR 0.7 12/21/2012   LDLCALC 93 12/21/2012   CREATININE 1.1 03/15/2013    HYPOKALEMIA: Her potassium is 3.3 and likely to be from her Maxzide. This has not been stopped as instructed when Invokana was started; however labetalol was stopped and her blood pressure is quite normal, also taking low dose ramipril. Not on supplements No history of edema  Medication List       This list is accurate as of: 03/17/13  3:12 PM.  Always use your most recent med list.               albuterol 108 (90 BASE) MCG/ACT inhaler  Commonly known as:  PROVENTIL HFA;VENTOLIN HFA  Inhale 2 puffs into the lungs every 6 (six) hours as needed. For wheezing     Canagliflozin-Metformin HCl 150-500 MG Tabs  Take 150 mg by mouth 2 (two) times daily.     Fluticasone-Salmeterol 500-50 MCG/DOSE Aepb  Commonly known as:  ADVAIR DISKUS  Inhale 1 puff into the lungs daily. Pt has medication at home     glucose blood test strip  Commonly known as:  ONETOUCH VERIO  Use as instructed to check blood sugars once a day     insulin detemir 100 UNIT/ML injection  Commonly known as:  LEVEMIR  Inject 0.22-0.28 mLs (22-28 Units total) into the skin 2 (two) times daily. 22 units in the am and 28 units at  bedtime     insulin lispro 100 UNIT/ML KiwkPen  Commonly known as:  HUMALOG KWIKPEN  Inject 22 Units into the skin 3 (three) times daily.     Insulin Pen Needle 32G X 4 MM Misc  Commonly known as:  BD PEN NEEDLE NANO U/F  Use as directed 3 times per day     labetalol 100 MG tablet  Commonly known as:  NORMODYNE  Take 2 tablets (200 mg total) by mouth 2 (two) times daily.     lovastatin 20 MG tablet  Commonly known as:  MEVACOR     pioglitazone 15 MG tablet  Commonly known as:  ACTOS  Take 1 tablet (15 mg total) by mouth daily.     ramipril 2.5 MG capsule  Commonly known as:  ALTACE     SYMBICORT 160-4.5 MCG/ACT inhaler  Generic drug:  budesonide-formoterol     triamterene-hydrochlorothiazide 37.5-25 MG per tablet  Commonly known as:  MAXZIDE-25        Allergies: No Known Allergies  Past Medical History  Diagnosis Date  . Hyperlipidemia   . Obesity, Class III, BMI 40-49.9 (morbid obesity)   . Allergic rhinitis   . HSV-1 infection 2010  . HSV-2 infection 2010  . Abnormal Pap smear 04/2010  . H/O varicella   . H/O candidiasis   . Shortness of breath   . Asthma     inhaler use 3-4 x per day since pregnant  . Sleep apnea     CPAP machine  . Diabetes mellitus     Past Surgical History  Procedure Laterality Date  . Refractive surgery    . Ovarian cyst removal  2003    left  . Tonsillectomy      age 1  . Wisdom tooth extraction  2009  . Cesarean section      Family History  Problem Relation Age of Onset  . Asthma Mother   . Hyperlipidemia Mother   . Hypertension Mother   . Asthma Father   . Diabetes Father   . Hypertension Father   . Asthma Sister   . Mental illness Sister     Social History:  reports that she has never smoked. She has never used smokeless tobacco. She reports that she does not drink alcohol or use illicit drugs.  Review of Systems:  History of asthma  Hypertension:  she is only on ramipril low dose and Maxzide, not doing home  monitoring   Lipids: Last LDL 93, has been treated with Mevacor 20 mg orally      Examination:   BP 126/78  Pulse 98  Temp(Src) 98.2 F (36.8 C)  Resp 14  Ht 5\' 6"  (1.676 m)  Wt 332 lb 6.4 oz (150.776 kg)  BMI 53.68 kg/m2  SpO2 96%  LMP 03/24/2012  Body mass index is 53.68 kg/(m^2).    ASSESSMENT/ PLAN::   Diabetes type 2   Blood glucose control is significantly better with adding Invokana to her regimen  A1c still not below 7% yet She has also been able to lose weight this time She appears to be better compliant with diet with cutting back on carbohydrates Has not started exercising as discussed on previous visits Currently tolerating Invokamet with 500 mg metformin twice a day but may not be benefiting completely since she is not taking her first dose in the mornings and her glucose was high in the lab just after lunchtime  She was instructed on taking her Invokamet with breakfast rather than lunch She will check readings before and after lunch and consider adding some Humalog in the morning also if lunchtime readings are high To bring home glucose monitor each time for review For now also she will continue Actos and the same dose of insulin Consider adjusting Humalog based on carbohydrate intake, she will do more readings after meals  HYPERTENSION: Very well controlled now with 2.5 mg ramipril and Maxzide only However since she has low normal readings and her potassium is low we'll try reducing Maxzide to half tablet  Hypokalemia: She will start a supplement for 30 days  Counseling time over 50% of today's 25 minute visit  Shonta Phillis 03/17/2013, 3:12 PM

## 2013-03-17 NOTE — Patient Instructions (Addendum)
Take 1/2 Maxzide daily   If sugar >140 at lunch take at least 6 Humalog in am also  Take 1 Metfomin at dinner Take Invokamet in am   Walk!!

## 2013-05-01 ENCOUNTER — Other Ambulatory Visit: Payer: Self-pay | Admitting: *Deleted

## 2013-05-01 MED ORDER — CANAGLIFLOZIN-METFORMIN HCL 150-500 MG PO TABS: 150.0000 mg | ORAL_TABLET | Freq: Two times a day (BID) | ORAL | Status: DC

## 2013-05-20 ENCOUNTER — Encounter: Payer: Self-pay | Admitting: *Deleted

## 2013-06-15 ENCOUNTER — Other Ambulatory Visit: Payer: No Typology Code available for payment source

## 2013-06-15 ENCOUNTER — Other Ambulatory Visit: Payer: Self-pay | Admitting: *Deleted

## 2013-06-15 DIAGNOSIS — IMO0001 Reserved for inherently not codable concepts without codable children: Secondary | ICD-10-CM

## 2013-06-15 DIAGNOSIS — E1165 Type 2 diabetes mellitus with hyperglycemia: Principal | ICD-10-CM

## 2013-06-16 ENCOUNTER — Other Ambulatory Visit (INDEPENDENT_AMBULATORY_CARE_PROVIDER_SITE_OTHER): Payer: PRIVATE HEALTH INSURANCE

## 2013-06-16 DIAGNOSIS — IMO0001 Reserved for inherently not codable concepts without codable children: Secondary | ICD-10-CM

## 2013-06-16 DIAGNOSIS — E1165 Type 2 diabetes mellitus with hyperglycemia: Principal | ICD-10-CM

## 2013-06-16 LAB — COMPREHENSIVE METABOLIC PANEL
ALBUMIN: 3.7 g/dL (ref 3.5–5.2)
ALT: 8 U/L (ref 0–35)
AST: 15 U/L (ref 0–37)
Alkaline Phosphatase: 74 U/L (ref 39–117)
BUN: 11 mg/dL (ref 6–23)
CALCIUM: 9.2 mg/dL (ref 8.4–10.5)
CHLORIDE: 104 meq/L (ref 96–112)
CO2: 23 meq/L (ref 19–32)
Creatinine, Ser: 0.8 mg/dL (ref 0.4–1.2)
GFR: 106.26 mL/min (ref >60.00)
GLUCOSE: 104 mg/dL — AB (ref 70–99)
POTASSIUM: 3.7 meq/L (ref 3.5–5.1)
Sodium: 137 mEq/L (ref 135–145)
Total Bilirubin: 0.3 mg/dL (ref 0.3–1.2)
Total Protein: 8 g/dL (ref 6.0–8.3)

## 2013-06-16 LAB — HEMOGLOBIN A1C: Hgb A1c MFr Bld: 6.5 % (ref 4.6–6.5)

## 2013-06-21 ENCOUNTER — Ambulatory Visit (INDEPENDENT_AMBULATORY_CARE_PROVIDER_SITE_OTHER): Payer: PRIVATE HEALTH INSURANCE | Admitting: Endocrinology

## 2013-06-21 ENCOUNTER — Encounter: Payer: Self-pay | Admitting: Endocrinology

## 2013-06-21 VITALS — BP 124/82 | HR 100 | Temp 97.6°F | Resp 16 | Ht 66.0 in | Wt 324.2 lb

## 2013-06-21 DIAGNOSIS — E876 Hypokalemia: Secondary | ICD-10-CM

## 2013-06-21 DIAGNOSIS — E785 Hyperlipidemia, unspecified: Secondary | ICD-10-CM

## 2013-06-21 DIAGNOSIS — IMO0001 Reserved for inherently not codable concepts without codable children: Secondary | ICD-10-CM

## 2013-06-21 DIAGNOSIS — I10 Essential (primary) hypertension: Secondary | ICD-10-CM

## 2013-06-21 DIAGNOSIS — E1165 Type 2 diabetes mellitus with hyperglycemia: Principal | ICD-10-CM

## 2013-06-21 NOTE — Patient Instructions (Addendum)
Please check blood sugars at least half the time about 2 hours after any meal and as directed on waking up. Please bring blood sugar monitor to each visit  Take Humalog every meal that has Carbs; 14-20 units  Can Take Levemir in pm at dinner

## 2013-06-21 NOTE — Progress Notes (Signed)
Patient ID: Tina Flores, female   DOB: 1975-05-15, 38 y.o.   MRN: [id number]   Reason for Appointment: Diabetes follow-up   History of Present Illness   Diagnosis: Type 2 DIABETES MELITUS, date of diagnosis 2005   Previous history: She was started on insulin within a few years of initial diagnosis She has generally been requiring large doses of insulin for control especially since her pregnancy During her pregnancy with doing consistently well with her insulin doses and diet she had been able to get her A1c down to 6.5 Previously has tried Victoza and did not benefit significantly along with having side effects of nausea Overall has had significant difficulty with not being able to lose weight Her blood sugars were at the highest level in 11/2012 because of noncompliance  Recent history:  She is finally was able to get good control of her diabetes with adding the Invokamet to her insulin regimen Also more recently has been better compliant with diet and interested in weight loss However she is not doing any formal exercise Taking the Invokamet twice a day with no side effects She is taking this more consistently now without any side effects although now takes it at lunch and dinner with her meals Her A1c is at the best level of 6.5  With her blood sugars being better she is tending to skip her Humalog periodically, although most of the time she is not taking it when she is avoiding carbohydrates at meals; last night glucose over 200 with not taking Humalog She has been taking her Levemir fairly consistently although occasionally may forget it at bedtime Again not checking blood sugars are much and mostly in the morning     Oral hypoglycemic drugs: Invokamet 150/500 twice a day, Actos 15 mg daily      Side effects from medications: Nausea from Victoza Insulin regimen: Humalog 18-24 units 15 min ac , Levemir 22 units in the morning and 28 at night         Proper timing of  medications in relation to meals: Yes.          Monitors blood glucose: Once a day.    Glucometer: One Touch ultra         Blood Glucose readings: occ higher 170s  PREMEAL Breakfast Lunch Dinner Bedtime Overall  Glucose range:  106-177    116   127, 148    Mean/median:      140    POST-MEAL PC Breakfast PC Lunch PC Dinner  Glucose range:  199   143   217   Mean/median:      Hypoglycemia: Had one episode of low blood sugars at 1 AM in 3/15  Meals:  2-3 meals per day.  usually eating only grapefruit for breakfast         Physical activity: exercise: minimal            Dietician visit: Most recent:?              Wt Readings from Last 3 Encounters:  06/21/13 324 lb 3.2 oz (147.056 kg)  03/17/13 332 lb 6.4 oz (150.776 kg)  01/13/13 337 lb 14.4 oz (153.27 kg)   Diabetes labs: Lab Results  Component Value Date   HGBA1C 6.5 06/16/2013   HGBA1C 7.9* 03/15/2013   HGBA1C 9.2* 12/21/2012   Lab Results  Component Value Date   MICROALBUR 0.7 12/21/2012   LDLCALC 93 12/21/2012   CREATININE 0.8 06/16/2013  Medication List       This list is accurate as of: 06/21/13  4:17 PM.  Always use your most recent med list.               albuterol 108 (90 BASE) MCG/ACT inhaler  Commonly known as:  PROVENTIL HFA;VENTOLIN HFA  Inhale 2 puffs into the lungs every 6 (six) hours as needed. For wheezing     Canagliflozin-Metformin HCl 150-500 MG Tabs  Take 150 mg by mouth 2 (two) times daily.     Fluticasone-Salmeterol 500-50 MCG/DOSE Aepb  Commonly known as:  ADVAIR DISKUS  Inhale 1 puff into the lungs daily. Pt has medication at home     glucose blood test strip  Commonly known as:  ONETOUCH VERIO  Use as instructed to check blood sugars once a day     insulin detemir 100 UNIT/ML injection  Commonly known as:  LEVEMIR  Inject 0.22-0.28 mLs (22-28 Units total) into the skin 2 (two) times daily. 22 units in the am and 28 units at bedtime     insulin lispro 100 UNIT/ML KiwkPen   Commonly known as:  HUMALOG KWIKPEN  Inject 22 Units into the skin 3 (three) times daily.     Insulin Pen Needle 32G X 4 MM Misc  Commonly known as:  BD PEN NEEDLE NANO U/F  Use as directed 3 times per day     labetalol 100 MG tablet  Commonly known as:  NORMODYNE  Take 2 tablets (200 mg total) by mouth 2 (two) times daily.     lovastatin 20 MG tablet  Commonly known as:  MEVACOR     pioglitazone 15 MG tablet  Commonly known as:  ACTOS  Take 1 tablet (15 mg total) by mouth daily.     potassium chloride 10 MEQ tablet  Commonly known as:  K-DUR  Take 1 tablet (10 mEq total) by mouth daily.     potassium chloride 10 MEQ tablet  Commonly known as:  K-DUR,KLOR-CON     ramipril 2.5 MG capsule  Commonly known as:  ALTACE     SYMBICORT 160-4.5 MCG/ACT inhaler  Generic drug:  budesonide-formoterol     triamterene-hydrochlorothiazide 37.5-25 MG per tablet  Commonly known as:  MAXZIDE-25        Allergies: No Known Allergies  Past Medical History  Diagnosis Date  . Hyperlipidemia   . Obesity, Class III, BMI 40-49.9 (morbid obesity)   . Allergic rhinitis   . HSV-1 infection 2010  . HSV-2 infection 2010  . Abnormal Pap smear 04/2010  . H/O varicella   . H/O candidiasis   . Shortness of breath   . Asthma     inhaler use 3-4 x per day since pregnant  . Sleep apnea     CPAP machine  . Diabetes mellitus     Past Surgical History  Procedure Laterality Date  . Refractive surgery    . Ovarian cyst removal  2003    left  . Tonsillectomy      age 54  . Wisdom tooth extraction  2009  . Cesarean section      Family History  Problem Relation Age of Onset  . Asthma Mother   . Hyperlipidemia Mother   . Hypertension Mother   . Asthma Father   . Diabetes Father   . Hypertension Father   . Asthma Sister   . Mental illness Sister     Social History:  reports that she has never smoked. She  has never used smokeless tobacco. She reports that she does not drink alcohol or  use illicit drugs.  Review of Systems:  History of asthma  Hypertension:  she is on Maxide and ramipril low dose, not doing home monitoring  Takes her potassium 3 times a week, history of hypokalemia  Lipids: Last LDL 93, has been treated with Mevacor 20 mg orally      Examination:   BP 124/82  Pulse 100  Temp(Src) 97.6 F (36.4 C)  Resp 16  Ht 5\' 6"  (1.676 m)  Wt 324 lb 3.2 oz (147.056 kg)  BMI 52.35 kg/m2  SpO2 98%  Body mass index is 52.35 kg/(m^2).   No ankle edema  ASSESSMENT/ PLAN:   Diabetes type 2   Blood glucose control is consistently better with adding Invokana to her regimen  Her control has improved further since her last visit and weight is coming down some more Her A1c is not the best level in a long time at 6.5 This is despite her not taking her Humalog consistently at meals She is motivated better now and is also cutting back on carbohydrates at meals and trying to be a little more active She does have occasional high postprandial readings based on her not taking Humalog or not taking enough Also occasionally morning readings may be higher from forgetting bedtime Levemir Recommendations made today:  More frequent post prandial readings especially after supper to help adjust her mealtime dose  Take Humalog every time she has any carbohydrate at meals, may take up to 20 units  May take evening Levemir at suppertime for better compliance and continue same dose, may need to adjust this if fasting readings are not in target  Increase activity level  Continue Invokamet  HYPERTENSION: Is well controlled now with 2.5 mg ramipril and half tablet Maxzide  Potassium is stable but she needs to continue taking this 3 times a week since potassium is 3.7  Obesity: She is considering bariatric surgery and this would be reasonable  Elayne Snare 06/21/2013, 4:17 PM

## 2013-06-27 ENCOUNTER — Other Ambulatory Visit: Payer: Self-pay | Admitting: *Deleted

## 2013-06-27 MED ORDER — PIOGLITAZONE HCL 15 MG PO TABS: 15.0000 mg | ORAL_TABLET | Freq: Every day | ORAL | Status: DC

## 2013-09-08 ENCOUNTER — Other Ambulatory Visit: Payer: Self-pay

## 2013-09-08 MED ORDER — INSULIN DETEMIR 100 UNIT/ML ~~LOC~~ SOLN: 22.0000 [IU] | Freq: Two times a day (BID) | SUBCUTANEOUS | Status: DC

## 2013-09-19 ENCOUNTER — Other Ambulatory Visit: Payer: PRIVATE HEALTH INSURANCE

## 2013-09-22 ENCOUNTER — Ambulatory Visit: Payer: PRIVATE HEALTH INSURANCE | Admitting: Endocrinology

## 2013-10-12 ENCOUNTER — Other Ambulatory Visit: Payer: Self-pay | Admitting: *Deleted

## 2013-10-12 MED ORDER — INSULIN DETEMIR 100 UNIT/ML FLEXPEN: PEN_INJECTOR | SUBCUTANEOUS | Status: DC

## 2013-11-10 ENCOUNTER — Other Ambulatory Visit: Payer: Self-pay | Admitting: Internal Medicine

## 2013-11-10 DIAGNOSIS — R109 Unspecified abdominal pain: Secondary | ICD-10-CM

## 2013-11-14 ENCOUNTER — Encounter: Payer: Self-pay | Admitting: Cardiovascular Disease

## 2013-11-14 ENCOUNTER — Ambulatory Visit (INDEPENDENT_AMBULATORY_CARE_PROVIDER_SITE_OTHER): Payer: PRIVATE HEALTH INSURANCE | Admitting: Cardiovascular Disease

## 2013-11-14 VITALS — BP 128/73 | HR 69 | Ht 67.0 in | Wt 333.1 lb

## 2013-11-14 DIAGNOSIS — I10 Essential (primary) hypertension: Secondary | ICD-10-CM

## 2013-11-14 DIAGNOSIS — I517 Cardiomegaly: Secondary | ICD-10-CM

## 2013-11-14 NOTE — Progress Notes (Signed)
History of Present Illness: 38 yo female with history of HTN,  HLD, DM, OSA here today as a new patient for evaluation of LVH. She has been feeling well. Recent elevated blood pressure and BP meds adjusted. EKG in primary care was abnormal with possible LVH. Referral today for assessment for LVH. She denies chest pain or SOB. She denies palpitations, near syncope, syncope, LE edema.   Primary Care Physician: Dr. Lorenda Hatchet  Last Lipid Profile:Lipid Panel     Component Value Date/Time   CHOL 168 12/21/2012 1612   TRIG 158.0* 12/21/2012 1612   HDL 43.60 12/21/2012 1612   CHOLHDL 4 12/21/2012 1612   VLDL 31.6 12/21/2012 1612   LDLCALC 93 12/21/2012 1612     Past Medical History  Diagnosis Date  . Hyperlipidemia   . Obesity, Class III, BMI 40-49.9 (morbid obesity)   . Allergic rhinitis   . HSV-1 infection 2010  . HSV-2 infection 2010  . Abnormal Pap smear 04/2010  . H/O varicella   . H/O candidiasis   . Shortness of breath   . Asthma     inhaler use 3-4 x per day since pregnant  . Sleep apnea     CPAP machine  . Diabetes mellitus   . HTN (hypertension)     Past Surgical History  Procedure Laterality Date  . Refractive surgery    . Ovarian cyst removal  2003    left  . Tonsillectomy      age 5  . Wisdom tooth extraction  2009  . Cesarean section      Current Outpatient Prescriptions  Medication Sig Dispense Refill  . albuterol (PROVENTIL HFA;VENTOLIN HFA) 108 (90 BASE) MCG/ACT inhaler Inhale 2 puffs into the lungs every 6 (six) hours as needed. For wheezing      . Canagliflozin-Metformin HCl 150-500 MG TABS Take 150 mg by mouth 2 (two) times daily.  60 tablet  5  . glucose blood (ONETOUCH VERIO) test strip Use as instructed to check blood sugars once a day  50 each  5  . Insulin Detemir (LEVEMIR FLEXTOUCH) 100 UNIT/ML Pen Inject 22 units in the morning and 28 units at bedtime  15 mL  3  . insulin detemir (LEVEMIR) 100 UNIT/ML injection Inject 0.22-0.28 mLs (22-28  Units total) into the skin 2 (two) times daily. 22 units in the am and 28 units at bedtime  15 mL  0  . insulin lispro (HUMALOG KWIKPEN) 100 UNIT/ML SOPN Inject 22 Units into the skin 3 (three) times daily.  5 pen  5  . Insulin Pen Needle (BD PEN NEEDLE NANO U/F) 32G X 4 MM MISC Use as directed 3 times per day  100 each  5  . lovastatin (MEVACOR) 20 MG tablet daily.       . pioglitazone (ACTOS) 15 MG tablet Take 1 tablet (15 mg total) by mouth daily.  30 tablet  5  . potassium chloride (K-DUR) 10 MEQ tablet Take 1 tablet (10 mEq total) by mouth daily.  30 tablet  1  . ramipril (ALTACE) 2.5 MG capsule 2.5 mg 2 (two) times daily.       Marland Kitchen labetalol (NORMODYNE) 100 MG tablet Take 2 tablets (200 mg total) by mouth 2 (two) times daily.  60 tablet  6   No current facility-administered medications for this visit.    No Known Allergies  History   Social History  . Marital Status: Married    Spouse Name: N/A  Number of Children: 2  . Years of Education: N/A   Occupational History  . -Licensed therapist    Social History Main Topics  . Smoking status: Never Smoker   . Smokeless tobacco: Never Used  . Alcohol Use: No  . Drug Use: No  . Sexual Activity: Not on file     Comment: BTL   Other Topics Concern  . Not on file   Social History Narrative  . No narrative on file    Family History  Problem Relation Age of Onset  . Asthma Mother   . Hyperlipidemia Mother   . Hypertension Mother   . Asthma Father   . Diabetes Father   . Hypertension Father   . Asthma Sister   . Mental illness Sister   . Heart attack Paternal Grandmother     Review of Systems:  As stated in the HPI and otherwise negative.   BP 128/73  Pulse 69  Ht 5\' 7"  (1.702 m)  Wt 333 lb 1.9 oz (151.102 kg)  BMI 52.16 kg/m2  Physical Examination: General: Well developed, well nourished, NAD HEENT: OP clear, mucus membranes moist SKIN: warm, dry. No rashes. Neuro: No focal deficits Musculoskeletal: Muscle  strength 5/5 all ext Psychiatric: Mood and affect normal Neck: No JVD, no carotid bruits, no thyromegaly, no lymphadenopathy. Lungs:Clear bilaterally, no wheezes, rhonci, crackles Cardiovascular: Regular rate and rhythm. No murmurs, gallops or rubs. Abdomen:Soft. Bowel sounds present. Non-tender.  Extremities: No lower extremity edema. Pulses are 2 + in the bilateral DP/PT.  EKG: NSR, rate 69 bpm.   Assessment and Plan:   1. LVH: EKG is mildly abnormal. She has longstanding HTN. Will arrange echo to assess LV size and function. I will call her with the results of the echo.  2. HTN: BP well controlled. No changes

## 2013-11-14 NOTE — Patient Instructions (Signed)
Your physician recommends that you continue on your current medications as directed. Please refer to the Current Medication list given to you today.  Your physician has requested that you have an echocardiogram. Echocardiography is a painless test that uses sound waves to create images of your heart. It provides your doctor with information about the size and shape of your heart and how well your heart's chambers and valves are working. This procedure takes approximately one hour. There are no restrictions for this procedure.   Your physician wants you to follow-up in: [place] will receive a reminder letter in the mail two months in advance. If you don't receive a letter, please call our office to schedule the follow-up appointment.

## 2013-11-22 ENCOUNTER — Other Ambulatory Visit: Payer: PRIVATE HEALTH INSURANCE

## 2013-11-24 ENCOUNTER — Ambulatory Visit (HOSPITAL_COMMUNITY): Payer: No Typology Code available for payment source | Attending: Cardiovascular Disease | Admitting: Radiology

## 2013-11-24 DIAGNOSIS — E119 Type 2 diabetes mellitus without complications: Secondary | ICD-10-CM | POA: Diagnosis not present

## 2013-11-24 DIAGNOSIS — I10 Essential (primary) hypertension: Secondary | ICD-10-CM | POA: Insufficient documentation

## 2013-11-24 DIAGNOSIS — E785 Hyperlipidemia, unspecified: Secondary | ICD-10-CM | POA: Diagnosis not present

## 2013-11-24 DIAGNOSIS — I517 Cardiomegaly: Secondary | ICD-10-CM

## 2013-11-24 NOTE — Progress Notes (Signed)
Echocardiogram performed.  

## 2013-11-27 ENCOUNTER — Telehealth: Payer: Self-pay | Admitting: Cardiovascular Disease

## 2013-11-27 NOTE — Telephone Encounter (Signed)
New message ° ° ° ° ° °Returning Pat's call °

## 2013-11-27 NOTE — Telephone Encounter (Signed)
Spoke with pt and reviewed echo results with her.

## 2013-12-01 ENCOUNTER — Encounter: Payer: No Typology Code available for payment source | Attending: Internal Medicine | Admitting: Dietician

## 2013-12-01 ENCOUNTER — Encounter: Payer: Self-pay | Admitting: Dietician

## 2013-12-01 VITALS — Ht 67.0 in | Wt 331.4 lb

## 2013-12-01 DIAGNOSIS — Z6841 Body Mass Index (BMI) 40.0 and over, adult: Secondary | ICD-10-CM | POA: Insufficient documentation

## 2013-12-01 DIAGNOSIS — Z713 Dietary counseling and surveillance: Secondary | ICD-10-CM | POA: Diagnosis not present

## 2013-12-01 NOTE — Progress Notes (Signed)
Medical Nutrition Therapy:  Appt start time: 0800 end time:  0900.   Assessment:  Primary concerns today: Tina Flores is here today for weight management. She has history of HTN, hyperlipidemia, and T2DM. She reports over the last 3 months she has had unintentional weight gain of 15 lbs.  She states her usual weight is around 310 lbs.  Patient works two jobs.  She lives with her two girls and she is the primary food shopper/cooker.  She states they eat out a lot, and reports eating out about 5 times a week with the family and 10 times for her since she goes out at lunch.  She goes to [place] for a tuna fish sub, or at dinner they choose [employer], [employer], [employer], [employer], or [employer].  Patient states she is thinking about bariatric surgery and wants to start some healthier habits now.  She also wants to teach her daughters how to be healthier and be a good example for them.  Learning Readiness:  Ready  MEDICATIONS: see list   DIETARY INTAKE:  Usual eating pattern includes 2-3 meals and 2 snacks per day.   24-hr recall:  B ( AM): skipped, normally skips 2-3, or will have 3 pieces french toast and sausage, or hard boiled egg white and sausage, or granola bar and yogurt  Snk (10:30 AM): [employer] 3 peanut butter cookies  L (1:30 PM): [employer] 9 inch steak and cheese sub and 1 peanut butter cookie, bottled water, fruit punch & diet lemonade mixed Snk (6 PM): bottled water, oven roasted honey cashew nuts, mellow yellow D (8:15 PM): baked tilapia, yellow rice, broccolli, 2 bottles of water, 5 nights a week a Smirnoff cooler Snk ( PM): none Beverages: water, soda occasionally (once every 2 weeks), juice, coffee 1-2 times a week, sweet tea and lemonade mixed, alcoholic cooler  Usual physical activity: none currently, she does have a gym membership but has not been in the last 3 months.  Normally enjoys water aerobics and walking the track.  Progress Towards Goal(s):  In progress.   Nutritional Diagnosis:   NB-1.1 Food and nutrition-related knowledge deficit As related to no prior education for healthy, balanced diet for weight management.  As evidenced by patient report and statement "I have been gaining weight but I don't know why", and BMI >40.    Intervention:  Nutrition education for weight management through diet/lifestyle interventions.  Discussed healthy, balanced meals and snacks utilizing the MyPlate portion method.  Discussed lean protein options.  Discussed importance of reducing sugary beverages.  Encouraged consumption of more meals at home.  Stated goals of building good habits over time that will stick as lifetime changes appropriate for her and her family.  Discussed bariatric surgery as a weight loss tool and stated some of the changes with food intake involved with those procedures. Praised patient for making her health a priority.  Patient was engaged and was able to set realistic goals for herself.  Together we set the following goals:  Use MyPlate to choose healthy, balanced meals at home and at restaurants. Limit fried foods to 1 time a week. Prepare meals at home 3 days a week. Find ways to cut the amount of sugar in your drinks.  Your unsweet tea/lemonade mix and keep up the great work drinking water. Exercise 2 days a week for 45 minutes walking or going to water aerobics.  Teaching Method Utilized:  Visual Auditory Hands on  Handouts given during visit include:  MyPlate Portion Method  Low Carb Snack Suggestions  Barriers to learning/adherence to lifestyle change: none  Demonstrated degree of understanding via:  Teach Back   Monitoring/Evaluation:  Dietary intake, exercise, labs, and body weight prn.  Patient did not want follow-up at this time.  Business card was provided.

## 2013-12-01 NOTE — Patient Instructions (Signed)
Use MyPlate to choose healthy, balanced meals at home and at restaurants. Limit fried foods to 1 time a week. Prepare meals at home 3 days a week. Find ways to cut the amount of sugar in your drinks.  Your unsweet tea/lemonade mix and keep up the great work drinking water. Exercise 2 days a week for 45 minutes walking or going to water aerobics.

## 2013-12-04 ENCOUNTER — Ambulatory Visit
Admission: RE | Admit: 2013-12-04 | Discharge: 2013-12-04 | Disposition: A | Discharge status: Home / Self Care | Payer: No Typology Code available for payment source | Source: Ambulatory Visit | Attending: Internal Medicine | Admitting: Internal Medicine

## 2013-12-04 ENCOUNTER — Other Ambulatory Visit: Payer: Self-pay | Admitting: Internal Medicine

## 2013-12-04 DIAGNOSIS — R109 Unspecified abdominal pain: Secondary | ICD-10-CM

## 2013-12-04 DIAGNOSIS — I10 Essential (primary) hypertension: Secondary | ICD-10-CM

## 2013-12-21 ENCOUNTER — Ambulatory Visit: Payer: No Typology Code available for payment source | Admitting: Endocrinology

## 2014-01-01 ENCOUNTER — Encounter: Payer: Self-pay | Admitting: Dietician

## 2014-01-11 ENCOUNTER — Encounter: Payer: Self-pay | Admitting: Endocrinology

## 2014-01-11 ENCOUNTER — Ambulatory Visit (INDEPENDENT_AMBULATORY_CARE_PROVIDER_SITE_OTHER): Payer: No Typology Code available for payment source | Admitting: Endocrinology

## 2014-01-11 ENCOUNTER — Other Ambulatory Visit: Payer: Self-pay | Admitting: *Deleted

## 2014-01-11 VITALS — BP 142/94 | HR 61 | Temp 98.3°F | Resp 16 | Ht 67.0 in | Wt 334.8 lb

## 2014-01-11 DIAGNOSIS — E1165 Type 2 diabetes mellitus with hyperglycemia: Secondary | ICD-10-CM

## 2014-01-11 DIAGNOSIS — E785 Hyperlipidemia, unspecified: Secondary | ICD-10-CM

## 2014-01-11 DIAGNOSIS — IMO0002 Reserved for concepts with insufficient information to code with codable children: Secondary | ICD-10-CM

## 2014-01-11 DIAGNOSIS — I10 Essential (primary) hypertension: Secondary | ICD-10-CM

## 2014-01-11 LAB — CBC WITH DIFFERENTIAL/PLATELET
BASOS ABS: 0 10*3/uL (ref 0.0–0.1)
Basophils Relative: 0.4 % (ref 0.0–3.0)
EOS ABS: 0.1 10*3/uL (ref 0.0–0.7)
Eosinophils Relative: 2.2 % (ref 0.0–5.0)
HCT: 35.1 % — ABNORMAL LOW (ref 36.0–46.0)
Hemoglobin: 11.1 g/dL — ABNORMAL LOW (ref 12.0–15.0)
Lymphocytes Relative: 29.7 % (ref 12.0–46.0)
Lymphs Abs: 1.7 10*3/uL (ref 0.7–4.0)
MCHC: 31.6 g/dL (ref 30.0–36.0)
MCV: 79.5 fl (ref 78.0–100.0)
Monocytes Absolute: 0.3 10*3/uL (ref 0.1–1.0)
Monocytes Relative: 4.8 % (ref 3.0–12.0)
NEUTROS PCT: 62.9 % (ref 43.0–77.0)
Neutro Abs: 3.7 10*3/uL (ref 1.4–7.7)
Platelets: 322 10*3/uL (ref 150.0–400.0)
RBC: 4.41 Mil/uL (ref 3.87–5.11)
RDW: 16.8 % — ABNORMAL HIGH (ref 11.5–15.5)
WBC: 5.9 10*3/uL (ref 4.0–10.5)

## 2014-01-11 LAB — LIPID PANEL
Cholesterol: 146 mg/dL (ref 0–200)
HDL: 41.8 mg/dL (ref >39.00)
LDL Cholesterol: 84 mg/dL (ref 0–99)
NONHDL: 104.2
Total CHOL/HDL Ratio: 3
Triglycerides: 100 mg/dL (ref 0.0–149.0)
VLDL: 20 mg/dL (ref 0.0–40.0)

## 2014-01-11 LAB — COMPREHENSIVE METABOLIC PANEL
ALBUMIN: 3 g/dL — AB (ref 3.5–5.2)
ALT: 3 U/L (ref 0–35)
AST: 9 U/L (ref 0–37)
Alkaline Phosphatase: 68 U/L (ref 39–117)
BUN: 9 mg/dL (ref 6–23)
CALCIUM: 8.7 mg/dL (ref 8.4–10.5)
CHLORIDE: 105 meq/L (ref 96–112)
CO2: 26 meq/L (ref 19–32)
CREATININE: 0.8 mg/dL (ref 0.4–1.2)
GFR: 110.85 mL/min (ref >60.00)
Glucose, Bld: 106 mg/dL — ABNORMAL HIGH (ref 70–99)
POTASSIUM: 3.5 meq/L (ref 3.5–5.1)
SODIUM: 136 meq/L (ref 135–145)
TOTAL PROTEIN: 7.1 g/dL (ref 6.0–8.3)
Total Bilirubin: 0.2 mg/dL (ref 0.2–1.2)

## 2014-01-11 LAB — URINALYSIS, ROUTINE W REFLEX MICROSCOPIC
Bilirubin Urine: NEGATIVE
Ketones, ur: NEGATIVE
Leukocytes, UA: NEGATIVE
Nitrite: NEGATIVE
Specific Gravity, Urine: 1.01 (ref 1.000–1.030)
Total Protein, Urine: 30 — AB
Urine Glucose: >=1000 — AB
Urobilinogen, UA: 0.2 (ref 0.0–1.0)
pH: 6 (ref 5.0–8.0)

## 2014-01-11 LAB — MICROALBUMIN / CREATININE URINE RATIO
Creatinine,U: 82.6 mg/dL
Microalb Creat Ratio: 7.4 mg/g (ref 0.0–30.0)
Microalb, Ur: 6.1 mg/dL — ABNORMAL HIGH (ref 0.0–1.9)

## 2014-01-11 LAB — HEMOGLOBIN A1C: Hgb A1c MFr Bld: 6.7 % — ABNORMAL HIGH (ref 4.6–6.5)

## 2014-01-11 MED ORDER — CANAGLIFLOZIN-METFORMIN HCL 150-500 MG PO TABS: 150.0000 mg | ORAL_TABLET | Freq: Two times a day (BID) | ORAL | Status: DC

## 2014-01-11 MED ORDER — FLUCONAZOLE 150 MG PO TABS: ORAL_TABLET | ORAL | Status: DC

## 2014-01-11 MED ORDER — TRIAMTERENE-HCTZ 37.5-25 MG PO TABS: ORAL_TABLET | ORAL | Status: DC

## 2014-01-11 NOTE — Progress Notes (Signed)
Quick Note:  A1c fairly good at 6.7, cholesterol okay, urine shows trace of blood, has mild anemia,need to fax reports to PCP and bariatric center ______

## 2014-01-11 NOTE — Progress Notes (Signed)
Patient ID: Tina Flores, female   DOB: [birth year], 38 y.o.   MRN: [id number]   Reason for Appointment: Diabetes follow-up   History of Present Illness   Diagnosis: Type 2 DIABETES MELITUS, date of diagnosis 2005   Previous history: She was started on insulin within a few years of initial diagnosis She has generally been requiring large doses of insulin for control especially since her pregnancy During her pregnancy with doing consistently well with her insulin doses and diet she had been able to get her A1c down to 6.5 Previously has tried Victoza and did not benefit significantly along with having side effects of nausea Overall has had significant difficulty with not being able to lose weight Her blood sugars were at the highest level in 11/2012 because of noncompliance  Recent history:   She started having good control of diabetes with adding the Invokamet to her insulin regimen However she has not come back in follow-up since 05/2013 She now says that she is getting vaginal candidiasis about once a month requiring Diflucan Again she is not doing any formal exercise despite reminders She has gained some weight since her last visit She is still wanting to pursue bariatric surgery Also she has been noncompliant with checking her blood sugars  Insulin regimen: Humalog 0 units 15 min ac , Levemir 22 units in the morning and 28 at night         She has been taking her Levemir more regularly She is still not taking Humalog insulin as she thinks that it causes low sugars but was trying to take 20 units at a time  She does not know what her blood sugars are after meals  Oral hypoglycemic drugs: Invokamet 150/500 twice a day, Actos 15 mg daily       Side effects from medications: Nausea from Victoza Proper timing of medications in relation to meals: Yes.          Monitors blood glucose: none    Glucometer: One Touch ultra         Blood Glucose readings: was 120 fasting about  couple of months ago  Meals:  2-3 meals per day. Toast and egg for breakfast today   Physical activity: exercise: none           Dietician visit: Most recent: 11/2013             Wt Readings from Last 3 Encounters:  01/11/14 334 lb 12.8 oz (151.864 kg)  12/01/13 331 lb 6.4 oz (150.322 kg)  11/14/13 333 lb 1.9 oz (151.102 kg)   Diabetes labs:   Lab Results  Component Value Date   HGBA1C 6.5 06/16/2013   HGBA1C 7.9* 03/15/2013   HGBA1C 9.2* 12/21/2012   Lab Results  Component Value Date   MICROALBUR 0.7 12/21/2012   LDLCALC 93 12/21/2012   CREATININE 0.8 06/16/2013         Medication List       This list is accurate as of: 01/11/14  9:38 AM.  Always use your most recent med list.               albuterol 108 (90 BASE) MCG/ACT inhaler  Commonly known as:  PROVENTIL HFA;VENTOLIN HFA  Inhale 2 puffs into the lungs every 6 (six) hours as needed. For wheezing     Canagliflozin-Metformin HCl 150-500 MG Tabs  Take 150 mg by mouth 2 (two) times daily.     fluconazole 150 MG tablet  Commonly known as:  DIFLUCAN     glucose blood test strip  Commonly known as:  ONETOUCH VERIO  Use as instructed to check blood sugars once a day     insulin detemir 100 UNIT/ML injection  Commonly known as:  LEVEMIR  Inject 0.22-0.28 mLs (22-28 Units total) into the skin 2 (two) times daily. 22 units in the am and 28 units at bedtime     Insulin Detemir 100 UNIT/ML Pen  Commonly known as:  LEVEMIR FLEXTOUCH  Inject 22 units in the morning and 28 units at bedtime     insulin lispro 100 UNIT/ML KiwkPen  Commonly known as:  HUMALOG KWIKPEN  Inject 22 Units into the skin 3 (three) times daily.     Insulin Pen Needle 32G X 4 MM Misc  Commonly known as:  BD PEN NEEDLE NANO U/F  Use as directed 3 times per day     labetalol 100 MG tablet  Commonly known as:  NORMODYNE  Take 2 tablets (200 mg total) by mouth 2 (two) times daily.     lovastatin 20 MG tablet  Commonly known as:   MEVACOR  daily.     pioglitazone 15 MG tablet  Commonly known as:  ACTOS  Take 1 tablet (15 mg total) by mouth daily.     potassium chloride 10 MEQ tablet  Commonly known as:  K-DUR  Take 1 tablet (10 mEq total) by mouth daily.     ramipril 2.5 MG capsule  Commonly known as:  ALTACE  2.5 mg 2 (two) times daily.     ramipril 5 MG capsule  Commonly known as:  ALTACE     SYMBICORT 160-4.5 MCG/ACT inhaler  Generic drug:  budesonide-formoterol     valACYclovir 500 MG tablet  Commonly known as:  VALTREX        Allergies: No Known Allergies  Past Medical History  Diagnosis Date  . Hyperlipidemia   . Obesity, Class III, BMI 40-49.9 (morbid obesity)   . Allergic rhinitis   . HSV-1 infection 2010  . HSV-2 infection 2010  . Abnormal Pap smear 04/2010  . H/O varicella   . H/O candidiasis   . Shortness of breath   . Asthma     inhaler use 3-4 x per day since pregnant  . Sleep apnea     CPAP machine  . Diabetes mellitus   . HTN (hypertension)     Past Surgical History  Procedure Laterality Date  . Refractive surgery    . Ovarian cyst removal  2003    left  . Tonsillectomy      age 82  . Wisdom tooth extraction  2009  . Cesarean section      Family History  Problem Relation Age of Onset  . Asthma Mother   . Hyperlipidemia Mother   . Hypertension Mother   . Asthma Father   . Diabetes Father   . Hypertension Father   . Asthma Sister   . Mental illness Sister   . Heart attack Paternal Grandmother     Social History:  reports that she has never smoked. She has never used smokeless tobacco. She reports that she does not drink alcohol or use illicit drugs.  Review of Systems:  History of asthma  Hypertension:  she is on ramipril 5 only, not doing home monitoring  Her Maxzide was stopped by PCP Previously was  taking potassium, not recently and no labs available  Lipids: Last LDL 93, has been treated with Mevacor 20 mg  Lab Results  Component Value Date    CHOL 168 12/21/2012   HDL 43.60 12/21/2012   LDLCALC 93 12/21/2012   TRIG 158.0* 12/21/2012   CHOLHDL 4 12/21/2012         Examination:   BP 128/98 mmHg  Pulse 61  Temp(Src) 98.3 F (36.8 C)  Resp 16  Ht 5\' 7"  (1.702 m)  Wt 334 lb 12.8 oz (151.864 kg)  BMI 52.42 kg/m2  SpO2 98%  Body mass index is 52.42 kg/(m^2).   Repeat blood pressure 142/94 with large cuff on the left side  No ankle edema  ASSESSMENT/ PLAN:   Diabetes type 2   Blood glucose control is difficult to assess as she has not checked her blood sugars She has gained weight from relative noncompliance with diet and exercise She again is not very motivated although she wants to do bariatric surgery  Currently taking only basal insulin along with Invokamet and reportedly her A1c PCP a couple months ago was around 7% Although she is having recurrent candidiasis she wants to continue Lewis and does not want to reduce the dose She claims that her blood sugars are well controlled and does not want to take Humalog at this time Today post prandial glucose is fairly good in the office However will need to follow-up A1c She also needs better attempts for weight loss especially with regular exercise Emphasized the need for watching her diet consistently and exercising rather than relying on bariatric surgery alone for weight loss  Recommendations made today:  Start doing post prandial readings especially after supper to help determine need for mealtime coverage  More consistent glucose monitoring  Start regular walking or other exercise program  May take Diflucan as needed  Follow-up in one months and bring glucose monitor  HYPERTENSION: Is not controlled now with 5 mg ramipril and she needs to resume her half tablet Maxzide  Needs to have electrolytes checked again  Obesity: She is considering bariatric surgery and emphasized the need for long-term lifestyle changes also   Lipids: Needs follow-up  levels  Counseling time over 50% of today's 25 minute visit  Kyal Arts 01/11/2014, 9:38 AM

## 2014-01-11 NOTE — Patient Instructions (Signed)
Please check blood sugars at least half the time about 2 hours after any meal and 3 times per week on waking up.  Please bring blood sugar monitor to each visit    

## 2014-02-14 ENCOUNTER — Other Ambulatory Visit: Payer: Self-pay | Admitting: *Deleted

## 2014-02-14 MED ORDER — PIOGLITAZONE HCL 15 MG PO TABS: 15.0000 mg | ORAL_TABLET | Freq: Every day | ORAL | Status: DC

## 2014-02-14 MED ORDER — INSULIN DETEMIR 100 UNIT/ML FLEXPEN: PEN_INJECTOR | SUBCUTANEOUS | Status: DC

## 2014-02-16 ENCOUNTER — Ambulatory Visit: Payer: No Typology Code available for payment source | Admitting: Endocrinology

## 2014-02-27 ENCOUNTER — Telehealth: Payer: Self-pay | Admitting: Cardiovascular Disease

## 2014-02-27 NOTE — Telephone Encounter (Signed)
Spoke with pt. She would like to proceed with Bariatric Surgery as outlined below. Surgeon is requesting cardiac clearance. Pt reports she is feeling well.  Will forward to Dr. Angelena Form for review

## 2014-02-27 NOTE — Telephone Encounter (Signed)
Request for surgical clearance:  1. What type of surgery is being performed? Bariatric Surgery  2. When is this surgery scheduled? Not scheduled yet  3. Are there any medications that need to be held prior to surgery and how long? No  4. Name of physician performing surgery? Dr. Kreg Shropshire  5. What is your office phone and fax number? Phone: [phone] Fax: [phone]   6. Will need a letter sent and a call back to confirm it was sent please.

## 2014-02-28 ENCOUNTER — Encounter: Payer: Self-pay | Admitting: Cardiovascular Disease

## 2014-02-28 NOTE — Telephone Encounter (Signed)
Letter completed and in 'Letters" section. cdm

## 2014-03-01 NOTE — Telephone Encounter (Signed)
I called Dr. Synthia Innocent office to confirm fax number. Fax number below is correct.  Office staff states letter needs to state pt risk--mild or moderate--in addition to cardiac clearance. Will forward to Dr. Angelena Form to see if letter can be updated with this information.

## 2014-03-01 NOTE — Telephone Encounter (Signed)
Updated letter faxed

## 2014-03-02 HISTORY — PX: OTHER SURGICAL HISTORY: SHX169

## 2014-04-05 ENCOUNTER — Ambulatory Visit: Payer: No Typology Code available for payment source | Admitting: Dietician

## 2014-04-06 ENCOUNTER — Ambulatory Visit: Payer: No Typology Code available for payment source | Admitting: Dietician

## 2014-04-16 ENCOUNTER — Encounter: Payer: No Typology Code available for payment source | Attending: Specialist | Admitting: Dietician

## 2014-04-16 VITALS — Ht 67.0 in | Wt 335.0 lb

## 2014-04-16 DIAGNOSIS — Z713 Dietary counseling and surveillance: Secondary | ICD-10-CM | POA: Insufficient documentation

## 2014-04-16 DIAGNOSIS — Z6841 Body Mass Index (BMI) 40.0 and over, adult: Secondary | ICD-10-CM | POA: Insufficient documentation

## 2014-04-16 DIAGNOSIS — Z01818 Encounter for other preprocedural examination: Secondary | ICD-10-CM | POA: Insufficient documentation

## 2014-04-16 DIAGNOSIS — E669 Obesity, unspecified: Secondary | ICD-10-CM | POA: Diagnosis not present

## 2014-04-16 NOTE — Progress Notes (Signed)
  Pre-Op Assessment Visit:  Pre-Operative [facility]IPS Surgery  Medical Nutrition Therapy:  Appt start time: 707   End time:  400  Patient was seen on 04/16/2014 for Pre-Operative Nutrition Assessment. Assessment and letter of approval faxed to [facility] coordinator on 04/17/2014.   Preferred Learning Style:  No preference indicated   Learning Readiness:  Ready  Handouts given during visit include:  Pre-Op Goals Bariatric Surgery Protein Shakes   During the appointment today the following Pre-Op Goals were reviewed with the patient: Maintain or lose weight as instructed by your surgeon Make healthy food choices Begin to limit portion sizes Limited concentrated sugars and fried foods Keep fat/sugar in the single digits per serving on   food labels Practice CHEWING your food  (aim for 30 chews per bite or until applesauce consistency) Practice not drinking 15 minutes before, during, and 30 minutes after each meal/snack Avoid all carbonated beverages  Avoid/limit caffeinated beverages  Avoid all sugar-sweetened beverages Consume 3 meals per day; eat every 3-5 hours Make a list of non-food related activities Aim for 64-100 ounces of FLUID daily  Aim for at least 60-80 grams of PROTEIN daily Look for a liquid protein source that contain ?15 g protein and ?5 g carbohydrate  (ex: shakes, drinks, shots)  Demonstrated degree of understanding via:  Teach Back  Teaching Method Utilized:  Visual Auditory Hands on  Barriers to learning/adherence to lifestyle change: none  [facility] to [facility] to enroll in Pre-Op and Post-Op Nutrition Education when surgery date is scheduled.

## 2014-04-17 ENCOUNTER — Encounter: Payer: Self-pay | Admitting: Dietician

## 2014-04-30 ENCOUNTER — Ambulatory Visit: Payer: No Typology Code available for payment source | Admitting: Dietician

## 2014-05-14 ENCOUNTER — Other Ambulatory Visit: Payer: Self-pay | Admitting: *Deleted

## 2014-05-14 MED ORDER — INSULIN PEN NEEDLE 32G X 4 MM MISC: Status: DC

## 2014-05-28 ENCOUNTER — Ambulatory Visit: Payer: No Typology Code available for payment source | Admitting: Endocrinology

## 2014-06-13 ENCOUNTER — Ambulatory Visit (INDEPENDENT_AMBULATORY_CARE_PROVIDER_SITE_OTHER): Payer: 59 | Admitting: Endocrinology

## 2014-06-13 ENCOUNTER — Other Ambulatory Visit: Payer: Self-pay

## 2014-06-13 VITALS — BP 150/96 | HR 98 | Temp 97.9°F | Ht 67.0 in | Wt 332.0 lb

## 2014-06-13 DIAGNOSIS — IMO0002 Reserved for concepts with insufficient information to code with codable children: Secondary | ICD-10-CM

## 2014-06-13 DIAGNOSIS — E1165 Type 2 diabetes mellitus with hyperglycemia: Secondary | ICD-10-CM | POA: Diagnosis not present

## 2014-06-13 DIAGNOSIS — I10 Essential (primary) hypertension: Secondary | ICD-10-CM | POA: Diagnosis not present

## 2014-06-13 DIAGNOSIS — E785 Hyperlipidemia, unspecified: Secondary | ICD-10-CM | POA: Diagnosis not present

## 2014-06-13 LAB — GLUCOSE, POCT (MANUAL RESULT ENTRY): POC GLUCOSE: 227 mg/dL — AB (ref 70–99)

## 2014-06-13 MED ORDER — GLUCOSE BLOOD VI STRP: ORAL_STRIP | Status: DC

## 2014-06-13 MED ORDER — FLUCONAZOLE 150 MG PO TABS: ORAL_TABLET | ORAL | Status: DC

## 2014-06-13 NOTE — Progress Notes (Signed)
Pre visit review using our clinic review tool, if applicable. No additional management support is needed unless otherwise documented below in the visit note. 

## 2014-06-13 NOTE — Patient Instructions (Addendum)
Please check blood sugars at least half the time about 2 hours after any meal and 3 times per week on waking up.   Please bring blood sugar monitor to each visit.  Recommended blood sugar levels about 2 hours after meal is 140-160 and on waking up 90-130  Humalog 8-12 at meals based on Carbs  Take  2 Ramipril daily

## 2014-06-13 NOTE — Progress Notes (Signed)
Patient ID: Tina Flores, female   DOB: Jan 12, 1976, 39 y.o.   MRN: [id number]   Reason for Appointment: Diabetes follow-up   History of Present Illness   Diagnosis: Type 2 DIABETES MELITUS, date of diagnosis 2005   Previous history: She was started on insulin within a few years of initial diagnosis She has generally been requiring large doses of insulin for control especially since her pregnancy During her pregnancy with doing consistently well with her insulin doses and diet she had been able to get her A1c down to 6.5 Previously has tried Victoza and did not benefit significantly along with having side effects of nausea Overall has had significant difficulty with not being able to lose weight Her blood sugars were at the highest level in 11/2012 because of noncompliance  Recent history:  She again is noncompliant with her follow-up appointments Also has not had her labs checked; for at least a month has not monitored her blood sugar and not clear if she was doing this regularly previously also Glucose in the office is significantly high after lunch today  She was previously having good control of diabetes with adding the Invokamet to her insulin regimen She  says that she is still getting vaginal candidiasis about once a month requiring Diflucan  She does not take any mealtime insulin, had stopped this previously and this was not restarted since A1c was fairly good on the last visit She has not been able to lose much weight despite trying to walk regularly and also seeing a nutritionist a couple of times  Insulin regimen: Humalog: Not taking; Levemir 24 units in the morning and 28 at night          Oral hypoglycemic drugs: Invokamet 150/500 twice a day, Actos 15 mg daily       Side effects from medications: Nausea from Victoza Proper timing of medications in relation to meals: Yes.          Monitors blood glucose: none    Glucometer: One Touch ultra         Blood Glucose  readings: Previously 120 fasting about 1 mth ago; pc 145-160  Meals:  2-3 meals per day. Toast and egg for breakfast    Physical activity: exercise: 4/7 days, 30 min           Dietician visit: Most recent: 2/16             Wt Readings from Last 3 Encounters:  06/13/14 332 lb (150.594 kg)  04/17/14 335 lb (151.955 kg)  01/11/14 334 lb 12.8 oz (151.864 kg)   Diabetes labs:   Lab Results  Component Value Date   HGBA1C 6.7* 01/11/2014   HGBA1C 6.5 06/16/2013   HGBA1C 7.9* 03/15/2013   Lab Results  Component Value Date   MICROALBUR 6.1* 01/11/2014   LDLCALC 84 01/11/2014   CREATININE 0.8 01/11/2014         Medication List       This list is accurate as of: 06/13/14  3:47 PM.  Always use your most recent med list.               albuterol 108 (90 BASE) MCG/ACT inhaler  Commonly known as:  PROVENTIL HFA;VENTOLIN HFA  Inhale 2 puffs into the lungs every 6 (six) hours as needed. For wheezing     Canagliflozin-Metformin HCl 150-500 MG Tabs  Take 150 mg by mouth 2 (two) times daily.     fluconazole 150 MG tablet  Commonly  known as:  DIFLUCAN  Use as directed     glucose blood test strip  Commonly known as:  ONETOUCH VERIO  Use as instructed to check blood sugars once a day     Insulin Detemir 100 UNIT/ML Pen  Commonly known as:  LEVEMIR FLEXTOUCH  Inject 22 units in the morning and 28 units at bedtime     Insulin Pen Needle 32G X 4 MM Misc  Commonly known as:  BD PEN NEEDLE NANO U/F  Use as directed 3 times per day     lovastatin 20 MG tablet  Commonly known as:  MEVACOR  daily.     pioglitazone 15 MG tablet  Commonly known as:  ACTOS  Take 1 tablet (15 mg total) by mouth daily.     potassium chloride 10 MEQ tablet  Commonly known as:  K-DUR  Take 1 tablet (10 mEq total) by mouth daily.     ramipril 5 MG capsule  Commonly known as:  ALTACE     SYMBICORT 160-4.5 MCG/ACT inhaler  Generic drug:  budesonide-formoterol     triamterene-hydrochlorothiazide  37.5-25 MG per tablet  Commonly known as:  MAXZIDE-25  Take 1/2 tablet by mouth daily     valACYclovir 500 MG tablet  Commonly known as:  VALTREX        Allergies: No Known Allergies  Past Medical History  Diagnosis Date  . Hyperlipidemia   . Obesity, Class III, BMI 40-49.9 (morbid obesity)   . Allergic rhinitis   . HSV-1 infection 2010  . HSV-2 infection 2010  . Abnormal Pap smear 04/2010  . H/O varicella   . H/O candidiasis   . Shortness of breath   . Asthma     inhaler use 3-4 x per day since pregnant  . Sleep apnea     CPAP machine  . Diabetes mellitus   . HTN (hypertension)     Past Surgical History  Procedure Laterality Date  . Refractive surgery    . Ovarian cyst removal  2003    left  . Tonsillectomy      age 36  . Wisdom tooth extraction  2009  . Cesarean section      Family History  Problem Relation Age of Onset  . Asthma Mother   . Hyperlipidemia Mother   . Hypertension Mother   . Asthma Father   . Diabetes Father   . Hypertension Father   . Asthma Sister   . Mental illness Sister   . Heart attack Paternal Grandmother     Social History:  reports that she has never smoked. She has never used smokeless tobacco. She reports that she does not drink alcohol or use illicit drugs.  Review of Systems:  History of asthma  Hypertension:  she is on ramipril 5;  not doing home monitoring  Her Maxzide was restarted in 11/15 Previously was  taking potassium, not recently and no labs available  Lipids: Last LDL <100,  has been treated with Mevacor 20 mg   Lab Results  Component Value Date   CHOL 146 01/11/2014   HDL 41.80 01/11/2014   LDLCALC 84 01/11/2014   TRIG 100.0 01/11/2014   CHOLHDL 3 01/11/2014         Examination:   BP 150/96 mmHg  Pulse 98  Temp(Src) 97.9 F (36.6 C) (Oral)  Ht 5\' 7"  (1.702 m)  Wt 332 lb (150.594 kg)  BMI 51.99 kg/m2  SpO2 98%  LMP 06/06/2014  Body mass index is  51.99 kg/(m^2).   Repeat blood pressure  130/95 with large cuff on the left side  No ankle edema  ASSESSMENT/ PLAN:   Diabetes type 2   Blood glucose control is difficult to assess as she has not checked her blood sugars and not clear if she was taking regularly Although she was claiming her blood sugars were previously normal is well over 200 and the office today She again is not very motivated to take care of her diabetes although she is scheduled for bariatric surgery next month  Currently taking only basal insulin along with Invokamet  Needs to have an A1c checked today to assess her level of control  Recommendations made today:  Start glucose monitoring, new prescription sent.  She will need to be doing post prandial readings after various meals but most likely does need to restart Humalog up to 20 units  More consistent follow-up  Increase frequency of walking or other exercise program  May take Diflucan as needed  She will call if blood sugars are not improved before her surgery  HYPERTENSION: Is not controlled now with 5 mg ramipril along with half Maxzide and she needs to increase Ramipril to 10mg  daily  Needs to have electrolytes checked again  Obesity: She is due for bariatric surgery and emphasized the need for long-term lifestyle changes also   Lipids: Needs follow-up levels    Alucard Fearnow 06/13/2014, 3:47 PM

## 2014-06-18 ENCOUNTER — Other Ambulatory Visit (INDEPENDENT_AMBULATORY_CARE_PROVIDER_SITE_OTHER): Payer: 59

## 2014-06-18 DIAGNOSIS — E1165 Type 2 diabetes mellitus with hyperglycemia: Secondary | ICD-10-CM

## 2014-06-18 DIAGNOSIS — IMO0002 Reserved for concepts with insufficient information to code with codable children: Secondary | ICD-10-CM

## 2014-06-18 LAB — COMPREHENSIVE METABOLIC PANEL
ALK PHOS: 95 U/L (ref 39–117)
ALT: 8 U/L (ref 0–35)
AST: 13 U/L (ref 0–37)
Albumin: 4 g/dL (ref 3.5–5.2)
BUN: 16 mg/dL (ref 6–23)
CALCIUM: 9.4 mg/dL (ref 8.4–10.5)
CO2: 27 mEq/L (ref 19–32)
CREATININE: 0.84 mg/dL (ref 0.40–1.20)
Chloride: 99 mEq/L (ref 96–112)
GFR: 97.04 mL/min (ref >60.00)
Glucose, Bld: 158 mg/dL — ABNORMAL HIGH (ref 70–99)
Potassium: 3.7 mEq/L (ref 3.5–5.1)
Sodium: 132 mEq/L — ABNORMAL LOW (ref 135–145)
Total Bilirubin: 0.3 mg/dL (ref 0.2–1.2)
Total Protein: 7.8 g/dL (ref 6.0–8.3)

## 2014-06-18 LAB — HEMOGLOBIN A1C: HEMOGLOBIN A1C: 8.2 % — AB (ref 4.6–6.5)

## 2014-06-19 NOTE — Progress Notes (Signed)
Quick Note:  Please let patient know that the A1c is much higher at 8.2, need to know if her blood sugars are improving at home  ______

## 2014-08-16 ENCOUNTER — Ambulatory Visit (INDEPENDENT_AMBULATORY_CARE_PROVIDER_SITE_OTHER): Payer: 59 | Admitting: Endocrinology

## 2014-08-16 ENCOUNTER — Encounter: Payer: Self-pay | Admitting: Endocrinology

## 2014-08-16 VITALS — BP 118/82 | HR 105 | Temp 97.7°F | Resp 16 | Ht 67.0 in | Wt 294.2 lb

## 2014-08-16 DIAGNOSIS — I10 Essential (primary) hypertension: Secondary | ICD-10-CM | POA: Diagnosis not present

## 2014-08-16 DIAGNOSIS — D509 Iron deficiency anemia, unspecified: Secondary | ICD-10-CM

## 2014-08-16 DIAGNOSIS — E1165 Type 2 diabetes mellitus with hyperglycemia: Secondary | ICD-10-CM | POA: Diagnosis not present

## 2014-08-16 DIAGNOSIS — IMO0002 Reserved for concepts with insufficient information to code with codable children: Secondary | ICD-10-CM

## 2014-08-16 NOTE — Progress Notes (Addendum)
Patient ID: Tina Flores, female   DOB: 1975-05-20, 39 y.o.   MRN: [id number]   Reason for Appointment: Diabetes follow-up   History of Present Illness   Diagnosis: Type 2 DIABETES MELITUS, date of diagnosis 2005   Previous history: She was started on insulin within a few years of initial diagnosis She has generally been requiring large doses of insulin for control especially since her pregnancy During her pregnancy with doing consistently well with her insulin doses and diet she had been able to get her A1c down to 6.5 Previously has tried Victoza and did not benefit significantly along with having side effects of nausea Overall has had significant difficulty with not being able to lose weight Her blood sugars were at the highest level in 11/2012 because of noncompliance  Recent history:  She has had her bariatric surgery in mid May, not clear what procedure she had but it was something like gastric sleeve She was told after surgery not to take any diabetes treatment and she is off all oral and injectable drugs Although her blood sugars were excellent until about 5/24 her blood sugars have gradually gone up Recently fasting blood sugars have been mostly over 200 She has not checked readings after meals but late last month they were similar in the evenings compared to the morning reading  Currently she is in a very low carbohydrate diet with small meals every 3 hours or so. She has not been allowed to start walking as yet  Insulin regimen: None, previously: Levemir 24 units in the morning and 28 at night          Oral hypoglycemic drugs: None, previously: Invokamet 150/500 twice a day, Actos 15 mg daily       Side effects from medications: Nausea from Victoza, Candida infection from Invokamet       Monitors blood glucose: none    Glucometer: One Touch ultra         Blood Glucose readings: Previously 120 fasting about 1 mth ago; pc 145-160  Meals:  2-3 meals per day. Toast  and egg for breakfast    Physical activity: exercise: None currently  Dietician visit: Most recent: 2/16             Wt Readings from Last 3 Encounters:  08/16/14 294 lb 3.2 oz (133.448 kg)  06/13/14 332 lb (150.594 kg)  04/17/14 335 lb (151.955 kg)   Diabetes labs:   Lab Results  Component Value Date   HGBA1C 8.2* 06/18/2014   HGBA1C 6.7* 01/11/2014   HGBA1C 6.5 06/16/2013   Lab Results  Component Value Date   MICROALBUR 6.1* 01/11/2014   LDLCALC 84 01/11/2014   CREATININE 0.84 06/18/2014         Medication List       This list is accurate as of: 08/16/14 11:59 PM.  Always use your most recent med list.               albuterol 108 (90 BASE) MCG/ACT inhaler  Commonly known as:  PROVENTIL HFA;VENTOLIN HFA  Inhale 2 puffs into the lungs every 6 (six) hours as needed. For wheezing     fluconazole 150 MG tablet  Commonly known as:  DIFLUCAN  Use as directed     glucose blood test strip  Commonly known as:  ONETOUCH VERIO  Use as instructed to check blood sugars twice a day     Insulin Detemir 100 UNIT/ML Pen  Commonly known as:  LEVEMIR FLEXTOUCH  Inject 22 units in the morning and 28 units at bedtime     Insulin Pen Needle 32G X 4 MM Misc  Commonly known as:  BD PEN NEEDLE NANO U/F  Use as directed 3 times per day     lovastatin 20 MG tablet  Commonly known as:  MEVACOR  daily.     pioglitazone 15 MG tablet  Commonly known as:  ACTOS  Take 1 tablet (15 mg total) by mouth daily.     ramipril 5 MG capsule  Commonly known as:  ALTACE     SYMBICORT 160-4.5 MCG/ACT inhaler  Generic drug:  budesonide-formoterol     valACYclovir 500 MG tablet  Commonly known as:  VALTREX        Allergies: No Known Allergies  Past Medical History  Diagnosis Date  . Hyperlipidemia   . Obesity, Class III, BMI 40-49.9 (morbid obesity)   . Allergic rhinitis   . HSV-1 infection 2010  . HSV-2 infection 2010  . Abnormal Pap smear 04/2010  . H/O varicella   . H/O  candidiasis   . Shortness of breath   . Asthma     inhaler use 3-4 x per day since pregnant  . Sleep apnea     CPAP machine  . Diabetes mellitus   . HTN (hypertension)     Past Surgical History  Procedure Laterality Date  . Refractive surgery    . Ovarian cyst removal  2003    left  . Tonsillectomy      age 69  . Wisdom tooth extraction  2009  . Cesarean section      Family History  Problem Relation Age of Onset  . Asthma Mother   . Hyperlipidemia Mother   . Hypertension Mother   . Asthma Father   . Diabetes Father   . Hypertension Father   . Asthma Sister   . Mental illness Sister   . Heart attack Paternal Grandmother     Social History:  reports that she has never smoked. She has never used smokeless tobacco. She reports that she does not drink alcohol or use illicit drugs.  Review of Systems:  After her bariatric surgery she is on iron, B-12, calcium, vitamin D and a One-A-Day vitamin  History of asthma  Hypertension:  she is on ramipril 5 mg currently; previously was given 10 mg.  not doing home monitoring  Her Maxzide stopped Previously was  taking potassium, not recently and no labs available  Lipids: Last LDL <100,  has been treated with Mevacor 20 mg   Lab Results  Component Value Date   CHOL 146 01/11/2014   HDL 41.80 01/11/2014   LDLCALC 84 01/11/2014   TRIG 100.0 01/11/2014   CHOLHDL 3 01/11/2014         Examination:   BP 118/82 mmHg  Pulse 105  Temp(Src) 97.7 F (36.5 C)  Resp 16  Ht 5\' 7"  (1.702 m)  Wt 294 lb 3.2 oz (133.448 kg)  BMI 46.07 kg/m2  SpO2 96%  Body mass index is 46.07 kg/(m^2).      ASSESSMENT/ PLAN:   Diabetes type 2   Blood glucose control is inadequate currently even though she has had bariatric surgery and has lost about 30 pounds This is despite her extremely low caloric intake currently without any carbohydrates Patient is reluctant to take any oral medications since she does not want candidiasis from the  [place] and does not like taking metformin Also she is not checking  readings after meals and not clear if these are any higher, these appear to be fairly similar in the evenings 3 weeks ago  Recommendations made today:  Start glucose monitoring after meals at various times a day  Start Levemir with one injection of 20 units at night  Given flow sheet to help adjust her dose every 3 days to keep morning sugar is below at least 130  Start exercise when able to  Call if blood sugars and not consistently controlled  HYPERTENSION: Is  controlled now with 5 mg ramipril, previously had been more difficult to control Needs to have electrolytes checked again  Lipids: Needs follow-up levels at some point to assess need for medications  Post bariatric surgery: She needs to review of her iron level and chemistry as she is complaining of feeling some fatigue also and is on multiple supplements  Counseling time on subjects discussed above is over 50% of today's 25 minute visit   Niesha Bame 08/17/2014, 8:03 AM   ADDENDUM: potassium level is very low and need to start K Dur 20 mEq twice a day for 10 days and then come back for follow-up BMP and magnesium level. Also recommend OTC magnesium 400 mg twice a day Start OTC ferrous sulfate 325 mg daily for low iron level Prescription for potassium called in ______

## 2014-08-16 NOTE — Patient Instructions (Signed)
Levemir 20 units at nite

## 2014-08-17 LAB — COMPREHENSIVE METABOLIC PANEL
ALT: 14 U/L (ref 0–35)
AST: 18 U/L (ref 0–37)
Albumin: 3.9 g/dL (ref 3.5–5.2)
Alkaline Phosphatase: 92 U/L (ref 39–117)
BUN: 8 mg/dL (ref 6–23)
CO2: 25 meq/L (ref 19–32)
CREATININE: 0.81 mg/dL (ref 0.40–1.20)
Calcium: 9.1 mg/dL (ref 8.4–10.5)
Chloride: 96 mEq/L (ref 96–112)
GFR: 101.11 mL/min (ref >60.00)
Glucose, Bld: 194 mg/dL — ABNORMAL HIGH (ref 70–99)
Potassium: 2.9 mEq/L — ABNORMAL LOW (ref 3.5–5.1)
SODIUM: 133 meq/L — AB (ref 135–145)
TOTAL PROTEIN: 7.4 g/dL (ref 6.0–8.3)
Total Bilirubin: 0.3 mg/dL (ref 0.2–1.2)

## 2014-08-17 LAB — IBC PANEL
Iron: 31 ug/dL — ABNORMAL LOW (ref 42–145)
SATURATION RATIOS: 10 % — AB (ref 20.0–50.0)
TRANSFERRIN: 222 mg/dL (ref 212.0–360.0)

## 2014-08-17 LAB — CBC
HEMATOCRIT: 39 % (ref 36.0–46.0)
HEMOGLOBIN: 12.6 g/dL (ref 12.0–15.0)
MCHC: 32.3 g/dL (ref 30.0–36.0)
MCV: 76.7 fl — ABNORMAL LOW (ref 78.0–100.0)
PLATELETS: 291 10*3/uL (ref 150.0–400.0)
RBC: 5.08 Mil/uL (ref 3.87–5.11)
RDW: 17.7 % — ABNORMAL HIGH (ref 11.5–15.5)
WBC: 7.1 10*3/uL (ref 4.0–10.5)

## 2014-08-17 LAB — HEMOGLOBIN A1C: HEMOGLOBIN A1C: 7.3 % — AB (ref 4.6–6.5)

## 2014-08-18 MED ORDER — POTASSIUM CHLORIDE CRYS ER 20 MEQ PO TBCR: 20.0000 meq | EXTENDED_RELEASE_TABLET | Freq: Two times a day (BID) | ORAL | Status: DC

## 2014-08-18 NOTE — Progress Notes (Signed)
Quick Note:  Please let patient know that the potassium level is very low and need to start K Dur 20 mEq twice a day for 10 days and then come back for follow-up BMP and magnesium level. Also recommend OTC magnesium 400 mg twice a day Start OTC ferrous sulfate 325 mg daily for low iron level Prescription for potassium called in ______

## 2014-08-18 NOTE — Addendum Note (Signed)
Addended by: Elayne Snare on: 08/18/2014 09:55 PM   Modules accepted: Orders

## 2014-08-27 ENCOUNTER — Other Ambulatory Visit: Payer: Self-pay

## 2014-08-30 ENCOUNTER — Other Ambulatory Visit: Payer: Self-pay | Admitting: *Deleted

## 2014-08-30 MED ORDER — INSULIN DETEMIR 100 UNIT/ML FLEXPEN: PEN_INJECTOR | SUBCUTANEOUS | Status: DC

## 2014-09-25 ENCOUNTER — Other Ambulatory Visit (INDEPENDENT_AMBULATORY_CARE_PROVIDER_SITE_OTHER): Payer: 59

## 2014-09-25 DIAGNOSIS — E1165 Type 2 diabetes mellitus with hyperglycemia: Secondary | ICD-10-CM | POA: Diagnosis not present

## 2014-09-25 DIAGNOSIS — IMO0002 Reserved for concepts with insufficient information to code with codable children: Secondary | ICD-10-CM

## 2014-09-25 LAB — BASIC METABOLIC PANEL
BUN: 3 mg/dL — AB (ref 6–23)
CO2: 29 mEq/L (ref 19–32)
Calcium: 8.7 mg/dL (ref 8.4–10.5)
Chloride: 102 mEq/L (ref 96–112)
Creatinine, Ser: 0.66 mg/dL (ref 0.40–1.20)
GFR: 128 mL/min (ref >60.00)
Glucose, Bld: 142 mg/dL — ABNORMAL HIGH (ref 70–99)
Potassium: 2.9 mEq/L — ABNORMAL LOW (ref 3.5–5.1)
SODIUM: 141 meq/L (ref 135–145)

## 2014-09-26 LAB — FRUCTOSAMINE: Fructosamine: 208 umol/L (ref 0–285)

## 2014-09-27 ENCOUNTER — Ambulatory Visit (INDEPENDENT_AMBULATORY_CARE_PROVIDER_SITE_OTHER): Payer: 59 | Admitting: Endocrinology

## 2014-09-27 ENCOUNTER — Encounter: Payer: Self-pay | Admitting: Endocrinology

## 2014-09-27 VITALS — BP 134/88 | HR 83 | Temp 97.8°F | Resp 16 | Ht 67.0 in | Wt 281.0 lb

## 2014-09-27 DIAGNOSIS — I10 Essential (primary) hypertension: Secondary | ICD-10-CM | POA: Diagnosis not present

## 2014-09-27 DIAGNOSIS — E876 Hypokalemia: Secondary | ICD-10-CM

## 2014-09-27 DIAGNOSIS — E1165 Type 2 diabetes mellitus with hyperglycemia: Secondary | ICD-10-CM

## 2014-09-27 DIAGNOSIS — IMO0002 Reserved for concepts with insufficient information to code with codable children: Secondary | ICD-10-CM

## 2014-09-27 MED ORDER — SPIRONOLACTONE 50 MG PO TABS: 50.0000 mg | ORAL_TABLET | Freq: Every day | ORAL | Status: DC

## 2014-09-27 NOTE — Progress Notes (Signed)
Patient ID: Tina Flores, female   DOB: 06/26/1975, 39 y.o.   MRN: [id number]   Reason for Appointment:  follow-up of various problems  History of Present Illness   Diagnosis: Type 2 DIABETES MELITUS, date of diagnosis 2005   Previous history: She was started on insulin within a few years of initial diagnosis She has generally been requiring large doses of insulin for control especially since her pregnancy During her pregnancy with doing consistently well with her insulin doses and diet she had been able to get her A1c down to 6.5 Previously has tried Victoza and did not benefit significantly along with having side effects of nausea Overall has had significant difficulty with not being able to lose weight Her blood sugars were at the highest level in 11/2012 because of noncompliance  Recent history:  She has had her bariatric surgery in mid May, not clear what procedure she had but it was something like gastric sleeve Although she was told to restart Levemir on her last visit because of blood sugars over 200 she has not been taking this recently She is only taking metformin She has started walking regularly and is losing weight She thinks her blood sugars are fairly good but not clear if she is checking them at different times of the day Fructosamine and lab glucose are excellent Also not taking Actos Currently she is in a low carbohydrate diet with small meals every 3 hours or so.  Insulin regimen: None     Oral hypoglycemic drugs: Metformin 500 mg twice a day Side effects from medications: Nausea from Victoza, Candida infection from Invokamet       Monitors blood glucose:  once a day or so     Glucometer: One Touch ultra         Blood Glucose readings: 120s am, 130 nonfasting  Meals:  2-3 meals per day. Toast and egg for breakfast    Physical activity: exercise: Walking about 1.5 miles a day    Dietician visit: Most recent: 2/16             Wt Readings from  Last 3 Encounters:  09/27/14 281 lb (127.461 kg)  08/16/14 294 lb 3.2 oz (133.448 kg)  06/13/14 332 lb (150.594 kg)   Diabetes labs:   Lab Results  Component Value Date   HGBA1C 7.3* 08/16/2014   HGBA1C 8.2* 06/18/2014   HGBA1C 6.7* 01/11/2014   Lab Results  Component Value Date   MICROALBUR 6.1* 01/11/2014   LDLCALC 84 01/11/2014   CREATININE 0.66 09/25/2014   Lab on 09/25/2014  Component Date Value Ref Range Status  . Sodium 09/25/2014 141  135 - 145 mEq/L Final  . Potassium 09/25/2014 2.9* 3.5 - 5.1 mEq/L Final  . Chloride 09/25/2014 102  96 - 112 mEq/L Final  . CO2 09/25/2014 29  19 - 32 mEq/L Final  . Glucose, Bld 09/25/2014 142* 70 - 99 mg/dL Final  . BUN 09/25/2014 3* 6 - 23 mg/dL Final  . Creatinine, Ser 09/25/2014 0.66  0.40 - 1.20 mg/dL Final  . Calcium 09/25/2014 8.7  8.4 - 10.5 mg/dL Final  . GFR 09/25/2014 128.00  >60.00 mL/min Final  . Fructosamine 09/25/2014 208  0 - 285 umol/L Final   Comment: Published reference interval for apparently healthy subjects between age 19 and 63 is 79 - 285 umol/L and in a poorly controlled diabetic population is 228 - 563 umol/L with a mean of 396 umol/L.  Medication List       This list is accurate as of: 09/27/14 11:59 PM.  Always use your most recent med list.               albuterol 108 (90 BASE) MCG/ACT inhaler  Commonly known as:  PROVENTIL HFA;VENTOLIN HFA  Inhale 2 puffs into the lungs every 6 (six) hours as needed. For wheezing     fluconazole 150 MG tablet  Commonly known as:  DIFLUCAN  Use as directed     glucose blood test strip  Commonly known as:  ONETOUCH VERIO  Use as instructed to check blood sugars twice a day     Insulin Pen Needle 32G X 4 MM Misc  Commonly known as:  BD PEN NEEDLE NANO U/F  Use as directed 3 times per day     lovastatin 20 MG tablet  Commonly known as:  MEVACOR  daily.     metFORMIN 500 MG tablet  Commonly known as:  GLUCOPHAGE  Take 500 mg by mouth 2  (two) times daily with a meal.     potassium chloride 10 MEQ tablet  Commonly known as:  K-DUR  take 1 tablet by mouth once daily with food     spironolactone 50 MG tablet  Commonly known as:  ALDACTONE  Take 1 tablet (50 mg total) by mouth daily.     SYMBICORT 160-4.5 MCG/ACT inhaler  Generic drug:  budesonide-formoterol     valACYclovir 500 MG tablet  Commonly known as:  VALTREX        Allergies: No Known Allergies  Past Medical History  Diagnosis Date  . Hyperlipidemia   . Obesity, Class III, BMI 40-49.9 (morbid obesity)   . Allergic rhinitis   . HSV-1 infection 2010  . HSV-2 infection 2010  . Abnormal Pap smear 04/2010  . H/O varicella   . H/O candidiasis   . Shortness of breath   . Asthma     inhaler use 3-4 x per day since pregnant  . Sleep apnea     CPAP machine  . Diabetes mellitus   . HTN (hypertension)     Past Surgical History  Procedure Laterality Date  . Refractive surgery    . Ovarian cyst removal  2003    left  . Tonsillectomy      age 63  . Wisdom tooth extraction  2009  . Cesarean section      Family History  Problem Relation Age of Onset  . Asthma Mother   . Hyperlipidemia Mother   . Hypertension Mother   . Asthma Father   . Diabetes Father   . Hypertension Father   . Asthma Sister   . Mental illness Sister   . Heart attack Paternal Grandmother     Social History:  reports that she has never smoked. She has never used smokeless tobacco. She reports that she does not drink alcohol or use illicit drugs.  Review of Systems:  After her bariatric surgery she is on iron, B-12, calcium, vitamin D and a One-A-Day vitamin  HYPOKALEMIA: Her potassium was 2.9 and it is still low.  She was given 20 mEq of K Dur but she could not swallow this and is only taking 10 mEq  from her PCP Potassium is still very low at 2.9 Not on any diuretics  Lab Results  Component Value Date   CREATININE 0.66 09/25/2014   BUN 3* 09/25/2014   NA 141  09/25/2014   K 2.9*  09/25/2014   CL 102 09/25/2014   CO2 29 09/25/2014    History of asthma  Hypertension:  she is on ramipril 5 mg currently; previously was given 10 mg.  not doing home monitoring   Lipids: Last LDL <100,  has been treated with Mevacor 20 mg   Lab Results  Component Value Date   CHOL 146 01/11/2014   HDL 41.80 01/11/2014   LDLCALC 84 01/11/2014   TRIG 100.0 01/11/2014   CHOLHDL 3 01/11/2014      she has a normal hemoglobin was low iron level   Examination:   BP 134/88 mmHg  Pulse 83  Temp(Src) 97.8 F (36.6 C)  Resp 16  Ht 5\' 7"  (1.702 m)  Wt 281 lb (127.461 kg)  BMI 44.00 kg/m2  SpO2 97%  Body mass index is 44 kg/(m^2).   Repeat blood pressure 132/82 No pedal edema    ASSESSMENT/ PLAN:   Diabetes type 2   Blood glucose control is much better with taking only low dose metformin now She has lost weight progressively and is now able to exercise regularly She appears to be more motivated than usual  Recommendations made today:  Continue metformin alone  Continue exercise program and low carbohydrate diet  Bring blood sugar monitor for review  Consider stopping metformin 1 blood sugars are quite normal  HYPERTENSION: Is fairly well controlled now with 5 mg ramipril, high normal today  HYPOKALEMIA: This is fairly significant and persistent, not clear etiology as she is not on any diuretics Will need to have her increase her potassium to twice a day Also will check her medication on the next visit; meanwhile start magnesium 400 mg twice a day Will change her RAMIPRIL to ALDACTONE 50 mg daily Needs to have electrolytes checked again in one week    Tehran Rabenold 09/28/2014, 8:40 AM

## 2014-09-27 NOTE — Patient Instructions (Signed)
Magnesium 400mg  and potassium twice daily  Stop Ramipril

## 2014-10-04 ENCOUNTER — Other Ambulatory Visit: Payer: 59

## 2014-10-15 ENCOUNTER — Other Ambulatory Visit: Payer: Self-pay | Admitting: Internal Medicine

## 2014-10-15 DIAGNOSIS — L819 Disorder of pigmentation, unspecified: Secondary | ICD-10-CM

## 2014-10-15 DIAGNOSIS — M7989 Other specified soft tissue disorders: Secondary | ICD-10-CM

## 2014-10-16 ENCOUNTER — Ambulatory Visit
Admission: RE | Admit: 2014-10-16 | Discharge: 2014-10-16 | Disposition: A | Discharge status: Home / Self Care | Payer: 59 | Source: Ambulatory Visit | Attending: Internal Medicine | Admitting: Internal Medicine

## 2014-10-16 DIAGNOSIS — M7989 Other specified soft tissue disorders: Secondary | ICD-10-CM

## 2014-10-16 DIAGNOSIS — L819 Disorder of pigmentation, unspecified: Secondary | ICD-10-CM

## 2014-11-07 ENCOUNTER — Other Ambulatory Visit: Payer: 59

## 2014-11-12 ENCOUNTER — Ambulatory Visit: Payer: 59 | Admitting: Endocrinology

## 2014-11-28 ENCOUNTER — Other Ambulatory Visit (INDEPENDENT_AMBULATORY_CARE_PROVIDER_SITE_OTHER): Payer: 59

## 2014-11-28 DIAGNOSIS — E876 Hypokalemia: Secondary | ICD-10-CM

## 2014-11-28 LAB — MAGNESIUM: Magnesium: 1.9 mg/dL (ref 1.5–2.5)

## 2014-11-28 LAB — BASIC METABOLIC PANEL
BUN: 4 mg/dL — AB (ref 6–23)
CO2: 30 meq/L (ref 19–32)
CREATININE: 0.68 mg/dL (ref 0.40–1.20)
Calcium: 9 mg/dL (ref 8.4–10.5)
Chloride: 106 mEq/L (ref 96–112)
GFR: 123.55 mL/min (ref >60.00)
Glucose, Bld: 102 mg/dL — ABNORMAL HIGH (ref 70–99)
Potassium: 3.5 mEq/L (ref 3.5–5.1)
Sodium: 140 mEq/L (ref 135–145)

## 2014-12-03 ENCOUNTER — Encounter: Payer: Self-pay | Admitting: *Deleted

## 2014-12-03 ENCOUNTER — Ambulatory Visit: Payer: 59 | Admitting: Endocrinology

## 2014-12-03 ENCOUNTER — Telehealth: Payer: Self-pay | Admitting: Endocrinology

## 2014-12-03 NOTE — Telephone Encounter (Signed)
Patient no showed today's appt. Please advise on how to follow up. °A. No follow up necessary. °B. Follow up urgent. Contact patient immediately. °C. Follow up necessary. Contact patient and schedule visit in ___ days. °D. Follow up advised. Contact patient and schedule visit in ____weeks. ° °

## 2014-12-03 NOTE — Telephone Encounter (Signed)
To reschedule as soon as possible

## 2014-12-07 ENCOUNTER — Ambulatory Visit (INDEPENDENT_AMBULATORY_CARE_PROVIDER_SITE_OTHER): Payer: 59 | Admitting: Endocrinology

## 2014-12-07 ENCOUNTER — Other Ambulatory Visit: Payer: Self-pay | Admitting: *Deleted

## 2014-12-07 ENCOUNTER — Encounter: Payer: Self-pay | Admitting: Endocrinology

## 2014-12-07 VITALS — BP 126/70 | HR 68 | Temp 98.1°F | Resp 14 | Ht 67.0 in | Wt 252.8 lb

## 2014-12-07 DIAGNOSIS — E119 Type 2 diabetes mellitus without complications: Secondary | ICD-10-CM

## 2014-12-07 DIAGNOSIS — I10 Essential (primary) hypertension: Secondary | ICD-10-CM | POA: Diagnosis not present

## 2014-12-07 DIAGNOSIS — E876 Hypokalemia: Secondary | ICD-10-CM

## 2014-12-07 LAB — POCT GLYCOSYLATED HEMOGLOBIN (HGB A1C): HEMOGLOBIN A1C: 5.5

## 2014-12-07 MED ORDER — SPIRONOLACTONE 50 MG PO TABS: 50.0000 mg | ORAL_TABLET | Freq: Every day | ORAL | Status: DC

## 2014-12-07 MED ORDER — GLUCOSE BLOOD VI STRP: ORAL_STRIP | Status: DC

## 2014-12-07 NOTE — Progress Notes (Signed)
Patient ID: Tina Flores, female   DOB: Jan 22, 1976, 39 y.o.   MRN: [id number]   Reason for Appointment:  follow-up of diabetes and hypokalemia  History of Present Illness   Diagnosis: Type 2 DIABETES MELITUS, date of diagnosis 2005   Previous history: She was started on insulin within a few years of initial diagnosis She has generally been requiring large doses of insulin for control especially since her pregnancy During her pregnancy with doing consistently well with her insulin doses and diet she had been able to get her A1c down to 6.5 Previously has tried Victoza and did not benefit significantly along with having side effects of nausea Her blood sugars were at the highest level in 11/2012 because of noncompliance  Recent history:   She had her bariatric surgery in mid May, not clear what procedure she had but it was something like gastric sleeve With this her blood sugars have improved progressively and her medications have been reduced She is only taking metformin currently She has started walking regularly although less in the last 2 weeks because of her schedule She thinks her blood sugars or excellent till about 2 weeks ago, reading mostly around 100  Apparently  in the last 2 weeks blood sugars have been about 180-200 but her fasting lab glucose was 102 Has not done postprandial readings She is still following low carbohydrate diet with small meals    Oral hypoglycemic drugs: Metformin 500 mg twice a day Side effects from medications: Nausea from Victoza, Candida infection from Invokamet       Monitors blood glucose:  once a day or so, generally in the morning     Glucometer: One Touch         Blood Glucose readings: as above  Meals:  2-3 meals per day. Toast and egg for breakfast    Physical activity: exercise: Walking none for 2 weeks, was 1.5 miles a day    Dietician visit: Most recent: 2/16             Wt Readings from Last 3 Encounters:  12/07/14  252 lb 12.8 oz (114.669 kg)  09/27/14 281 lb (127.461 kg)  08/16/14 294 lb 3.2 oz (133.448 kg)   Diabetes labs:   Lab Results  Component Value Date   HGBA1C 5.5 12/07/2014   HGBA1C 7.3* 08/16/2014   HGBA1C 8.2* 06/18/2014   Lab Results  Component Value Date   MICROALBUR 6.1* 01/11/2014   LDLCALC 84 01/11/2014   CREATININE 0.68 11/28/2014   Office Visit on 12/07/2014  Component Date Value Ref Range Status  . Hemoglobin A1C 12/07/2014 5.5   Final         Medication List       This list is accurate as of: 12/07/14  1:18 PM.  Always use your most recent med list.               albuterol 108 (90 BASE) MCG/ACT inhaler  Commonly known as:  PROVENTIL HFA;VENTOLIN HFA  Inhale 2 puffs into the lungs every 6 (six) hours as needed. For wheezing     fluconazole 150 MG tablet  Commonly known as:  DIFLUCAN  Use as directed     glucose blood test strip  Commonly known as:  ONETOUCH VERIO  Use as instructed to check blood sugars twice a day     Insulin Pen Needle 32G X 4 MM Misc  Commonly known as:  BD PEN NEEDLE NANO U/F  Use as  directed 3 times per day     lovastatin 20 MG tablet  Commonly known as:  MEVACOR  daily.     metFORMIN 500 MG tablet  Commonly known as:  GLUCOPHAGE  Take 500 mg by mouth 2 (two) times daily with a meal.     potassium chloride 10 MEQ tablet  Commonly known as:  K-DUR  take 1 tablet by mouth once daily with food     ramipril 5 MG capsule  Commonly known as:  ALTACE     spironolactone 50 MG tablet  Commonly known as:  ALDACTONE  Take 1 tablet (50 mg total) by mouth daily.     SYMBICORT 160-4.5 MCG/ACT inhaler  Generic drug:  budesonide-formoterol     valACYclovir 500 MG tablet  Commonly known as:  VALTREX        Allergies: No Known Allergies  Past Medical History  Diagnosis Date  . Hyperlipidemia   . Obesity, Class III, BMI 40-49.9 (morbid obesity) ([place])   . Allergic rhinitis   . HSV-1 infection 2010  . HSV-2 infection  2010  . Abnormal Pap smear 04/2010  . H/O varicella   . H/O candidiasis   . Shortness of breath   . Asthma     inhaler use 3-4 x per day since pregnant  . Sleep apnea     CPAP machine  . Diabetes mellitus   . HTN (hypertension)     Past Surgical History  Procedure Laterality Date  . Refractive surgery    . Ovarian cyst removal  2003    left  . Tonsillectomy      age 12  . Wisdom tooth extraction  2009  . Cesarean section      Family History  Problem Relation Age of Onset  . Asthma Mother   . Hyperlipidemia Mother   . Hypertension Mother   . Asthma Father   . Diabetes Father   . Hypertension Father   . Asthma Sister   . Mental illness Sister   . Heart attack Paternal Grandmother     Social History:  reports that she has never smoked. She has never used smokeless tobacco. She reports that she does not drink alcohol or use illicit drugs.  Review of Systems:  After her bariatric surgery she is on iron, B-12, calcium, vitamin D and a One-A-Day vitamin  HYPOKALEMIA: Her potassium was 2.9 and she is taking 2 tablets of potassium daily, was told to take 3 by PCP She was also asked to switch ramipril to Aldactone but her primary care physician did not want her to do this for unknown reasons and it is still low.   Potassium is borderline at 3.5 Review of her previous labs indicates persistently low normal or low potassium levels for the last few years   Lab Results  Component Value Date   CREATININE 0.68 11/28/2014   BUN 4* 11/28/2014   NA 140 11/28/2014   K 3.5 11/28/2014   CL 106 11/28/2014   CO2 30 11/28/2014    Hypertension:  she is on ramipril 5 mg currently; previously was given 10 mg. She is not doing home monitoring   Lipids: Last LDL <100,  has been treated with Mevacor 20 mg   Lab Results  Component Value Date   CHOL 146 01/11/2014   HDL 41.80 01/11/2014   LDLCALC 84 01/11/2014   TRIG 100.0 01/11/2014   CHOLHDL 3 01/11/2014      she has a  normal hemoglobin but  had low iron level  Lab Results  Component Value Date   WBC 7.1 08/16/2014   HGB 12.6 08/16/2014   HCT 39.0 08/16/2014   MCV 76.7* 08/16/2014   PLT 291.0 08/16/2014     Examination:   BP 126/70 mmHg  Pulse 68  Temp(Src) 98.1 F (36.7 C)  Resp 14  Ht 5\' 7"  (1.702 m)  Wt 252 lb 12.8 oz (114.669 kg)  BMI 39.58 kg/m2  SpO2 99%  Body mass index is 39.58 kg/(m^2).      ASSESSMENT/ PLAN:   Diabetes type 2   Blood glucose control is much better with taking only low dose metformin  A1c is down to normal She has lost weight progressively and is trying to exercise as much as possible also  She appears to be still motivated but discussed needing to be consistent over the next 2 years to prevent any regain of her weight  Recommendations made today:  Continue metformin alone  regular exercise program and low carbohydrate diet  Bring blood sugar monitor for review, she needs to use new strips as current strips may be outdated or malfunctioning  Consider stopping metformin if blood sugars are consistently normal and her weight continues to improve  HYPERTENSION: Is fairly well controlled now with 5 mg ramipril, encouraged her to start checking blood pressure at home or work  HYPOKALEMIA: This is long-standing and persistent, not clear etiology but 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

## 2014-12-07 NOTE — Patient Instructions (Addendum)
Exercise,   Check BP  Stop Ramilpril and K  New rx for Spironolactone sent  Check blood sugars on waking up .. 2-3. times a week Also check blood sugars about 2 hours after a meal and do this after different meals by rotation Recommended blood sugar levels on waking up is 90-130 and about 2 hours after meal is 140-180 Please bring blood sugar monitor to each visit.

## 2014-12-21 ENCOUNTER — Other Ambulatory Visit: Payer: 59

## 2015-01-04 ENCOUNTER — Other Ambulatory Visit: Payer: 59

## 2015-01-07 ENCOUNTER — Other Ambulatory Visit (INDEPENDENT_AMBULATORY_CARE_PROVIDER_SITE_OTHER): Payer: 59

## 2015-01-07 DIAGNOSIS — E876 Hypokalemia: Secondary | ICD-10-CM

## 2015-01-07 LAB — BASIC METABOLIC PANEL
BUN: 7 mg/dL (ref 6–23)
CALCIUM: 9.1 mg/dL (ref 8.4–10.5)
CO2: 28 meq/L (ref 19–32)
CREATININE: 0.8 mg/dL (ref 0.40–1.20)
Chloride: 104 mEq/L (ref 96–112)
GFR: 102.37 mL/min (ref >60.00)
GLUCOSE: 117 mg/dL — AB (ref 70–99)
Potassium: 3.3 mEq/L — ABNORMAL LOW (ref 3.5–5.1)
Sodium: 141 mEq/L (ref 135–145)

## 2015-03-08 ENCOUNTER — Encounter: Payer: Self-pay | Admitting: Endocrinology

## 2015-03-08 ENCOUNTER — Ambulatory Visit (INDEPENDENT_AMBULATORY_CARE_PROVIDER_SITE_OTHER): Payer: 59 | Admitting: Endocrinology

## 2015-03-08 VITALS — BP 128/86 | HR 83 | Temp 97.9°F | Resp 16 | Ht 64.0 in | Wt 235.0 lb

## 2015-03-08 DIAGNOSIS — E876 Hypokalemia: Secondary | ICD-10-CM | POA: Diagnosis not present

## 2015-03-08 DIAGNOSIS — E119 Type 2 diabetes mellitus without complications: Secondary | ICD-10-CM

## 2015-03-08 LAB — COMPREHENSIVE METABOLIC PANEL
ALT: 30 U/L (ref 0–35)
AST: 29 U/L (ref 0–37)
Albumin: 4 g/dL (ref 3.5–5.2)
Alkaline Phosphatase: 105 U/L (ref 39–117)
BILIRUBIN TOTAL: 0.3 mg/dL (ref 0.2–1.2)
BUN: 8 mg/dL (ref 6–23)
CALCIUM: 9.2 mg/dL (ref 8.4–10.5)
CO2: 30 meq/L (ref 19–32)
CREATININE: 0.82 mg/dL (ref 0.40–1.20)
Chloride: 103 mEq/L (ref 96–112)
GFR: 99.41 mL/min (ref >60.00)
GLUCOSE: 104 mg/dL — AB (ref 70–99)
Potassium: 3.7 mEq/L (ref 3.5–5.1)
SODIUM: 139 meq/L (ref 135–145)
Total Protein: 7.3 g/dL (ref 6.0–8.3)

## 2015-03-08 LAB — LIPID PANEL
CHOL/HDL RATIO: 3
Cholesterol: 111 mg/dL (ref 0–200)
HDL: 39 mg/dL — ABNORMAL LOW (ref >39.00)
LDL Cholesterol: 59 mg/dL (ref 0–99)
NONHDL: 72.39
Triglycerides: 65 mg/dL (ref 0.0–149.0)
VLDL: 13 mg/dL (ref 0.0–40.0)

## 2015-03-08 LAB — POCT GLYCOSYLATED HEMOGLOBIN (HGB A1C): Hemoglobin A1C: 5.8

## 2015-03-08 NOTE — Progress Notes (Signed)
Patient ID: Tina Flores, female   DOB: Apr 04, 1975, 40 y.o.   MRN: [id number]   Reason for Appointment:  follow-up of diabetes and hypokalemia  History of Present Illness   Diagnosis: Type 2 DIABETES MELITUS, date of diagnosis 2005   Previous history: She was started on insulin within a few years of initial diagnosis She has generally been requiring large doses of insulin for control especially since her pregnancy During her pregnancy with doing consistently well with her insulin doses and diet she had been able to get her A1c down to 6.5 Previously has tried Victoza and did not benefit significantly along with having side effects of nausea Her blood sugars were at the highest level in 11/2012 because of noncompliance  Recent history:   She had her bariatric surgery in  5/ 16, not clear what procedure she had but it was something like gastric sleeve With this her blood sugars have improved progressively and her medications have been tapered off   Although she was taking metformin on her last visit she stopped taking this on her own  She however has not checked her blood sugar since then   A1c is still below 6%   She has not been motivated to walk recently because of her schedule   However her weight come to use to be improving She is  usually following low carbohydrate diet with small meals but is also going to fast food restaurants sometimes   Meals:  2-3 meals per day. Toast and egg for breakfast    Side effects from medications: Nausea from Victoza, Candida infection from Invokamet      Physical activity: exercise:  none     Dietician visit: Most recent: 2/16             Wt Readings from Last 3 Encounters:  03/08/15 235 lb (106.595 kg)  12/07/14 252 lb 12.8 oz (114.669 kg)  09/27/14 281 lb (127.461 kg)   Diabetes labs:   Lab Results  Component Value Date   HGBA1C 5.5 12/07/2014   HGBA1C 7.3* 08/16/2014   HGBA1C 8.2* 06/18/2014   Lab Results    Component Value Date   MICROALBUR 6.1* 01/11/2014   LDLCALC 84 01/11/2014   CREATININE 0.80 01/07/2015          Medication List       This list is accurate as of: 03/08/15  4:49 PM.  Always use your most recent med list.               albuterol 108 (90 Base) MCG/ACT inhaler  Commonly known as:  PROVENTIL HFA;VENTOLIN HFA  Inhale 2 puffs into the lungs every 6 (six) hours as needed. For wheezing     glucose blood test strip  Commonly known as:  ONETOUCH VERIO  Use as instructed to check blood sugars once a day dx code E11.65     Insulin Pen Needle 32G X 4 MM Misc  Commonly known as:  BD PEN NEEDLE NANO U/F  Use as directed 3 times per day     lovastatin 20 MG tablet  Commonly known as:  MEVACOR  daily.     potassium chloride 10 MEQ tablet  Commonly known as:  K-DUR  Reported on 03/08/2015     spironolactone 50 MG tablet  Commonly known as:  ALDACTONE  Take 1 tablet (50 mg total) by mouth daily.     SYMBICORT 160-4.5 MCG/ACT inhaler  Generic drug:  budesonide-formoterol  Allergies: No Known Allergies  Past Medical History  Diagnosis Date  . Hyperlipidemia   . Obesity, Class III, BMI 40-49.9 (morbid obesity) ([place])   . Allergic rhinitis   . HSV-1 infection 2010  . HSV-2 infection 2010  . Abnormal Pap smear 04/2010  . H/O varicella   . H/O candidiasis   . Shortness of breath   . Asthma     inhaler use 3-4 x per day since pregnant  . Sleep apnea     CPAP machine  . Diabetes mellitus   . HTN (hypertension)     Past Surgical History  Procedure Laterality Date  . Refractive surgery    . Ovarian cyst removal  2003    left  . Tonsillectomy      age 65  . Wisdom tooth extraction  2009  . Cesarean section      Family History  Problem Relation Age of Onset  . Asthma Mother   . Hyperlipidemia Mother   . Hypertension Mother   . Asthma Father   . Diabetes Father   . Hypertension Father   . Asthma Sister   . Mental illness Sister   . Heart  attack Paternal Grandmother     Social History:  reports that she has never smoked. She has never used smokeless tobacco. She reports that she does not drink alcohol or use illicit drugs.  Review of Systems:  After her bariatric surgery she is on calcium, vitamin D and a  Composite bariatric surgery vitamin  HYPOKALEMIA: Her potassium was  3.3 and she is taking Aldactone now , not clear if she has had a follow-up levels done by PCP  Review of her previous labs indicates persistently low normal or low potassium levels for the last few years   Lab Results  Component Value Date   CREATININE 0.80 01/07/2015   BUN 7 01/07/2015   NA 141 01/07/2015   K 3.3* 01/07/2015   CL 104 01/07/2015   CO2 28 01/07/2015    Hypertension:  she is on Aldactone and Norvasc recently from PCP   Her blood pressure at work has been consistently high , recently148/90   She has been told by her PCP to start taking benazepril also in addition yesterday  Lipids: Last LDL <100,  has been treated with Mevacor 20 mg   Lab Results  Component Value Date   CHOL 146 01/11/2014   HDL 41.80 01/11/2014   LDLCALC 84 01/11/2014   TRIG 100.0 01/11/2014   CHOLHDL 3 01/11/2014      She has a normal hemoglobin but had low iron level  Lab Results  Component Value Date   WBC 7.1 08/16/2014   HGB 12.6 08/16/2014   HCT 39.0 08/16/2014   MCV 76.7* 08/16/2014   PLT 291.0 08/16/2014     Examination:   BP 128/86 mmHg  Pulse 83  Temp(Src) 97.9 F (36.6 C)  Resp 16  Ht 5\' 4"  (1.626 m)  Wt 235 lb (106.595 kg)  BMI 40.32 kg/m2  SpO2 97%  Body mass index is 40.32 kg/(m^2).      ASSESSMENT/ PLAN:   Diabetes type 2   Blood glucose control is much better with her progressive weight loss after bariatric surgery A1c is down to normal   Currently is not on any medications She has lost weight progressively and is trying to exercise as much as possible also  Recommendations made today:   Restart glucose  monitoring periodically including after meals  regular  exercise program and low carbohydrate diet   avoid fast foods  Bring blood sugar monitor for review    check A1c again in 3 months  HYPERTENSION: Is difficult to control and is going to be followed up by PCP  HYPOKALEMIA: This is long-standing and persistent, not clear etiology but 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

## 2015-03-08 NOTE — Patient Instructions (Signed)
Exercise daily  Check blood sugars on waking up 1-2  times a week Also check blood sugars about 2 hours after a meal and do this after different meals by rotation  Recommended blood sugar levels on waking up is 90-130 and about 2 hours after meal is 130-160  Please bring your blood sugar monitor to each visit, thank you  No fried food or sodium < 400mg  per serving

## 2015-03-12 ENCOUNTER — Other Ambulatory Visit: Payer: Self-pay | Admitting: *Deleted

## 2015-03-12 DIAGNOSIS — E118 Type 2 diabetes mellitus with unspecified complications: Principal | ICD-10-CM

## 2015-03-12 DIAGNOSIS — IMO0002 Reserved for concepts with insufficient information to code with codable children: Secondary | ICD-10-CM

## 2015-03-12 DIAGNOSIS — E1165 Type 2 diabetes mellitus with hyperglycemia: Secondary | ICD-10-CM

## 2015-05-28 ENCOUNTER — Other Ambulatory Visit: Payer: 59

## 2015-05-31 ENCOUNTER — Ambulatory Visit: Payer: 59 | Admitting: Endocrinology

## 2015-06-07 ENCOUNTER — Ambulatory Visit: Payer: 59 | Admitting: Endocrinology

## 2015-06-25 ENCOUNTER — Other Ambulatory Visit: Payer: Self-pay

## 2015-06-25 DIAGNOSIS — Z1231 Encounter for screening mammogram for malignant neoplasm of breast: Secondary | ICD-10-CM

## 2015-07-05 ENCOUNTER — Ambulatory Visit
Admission: RE | Admit: 2015-07-05 | Discharge: 2015-07-05 | Disposition: A | Discharge status: Home / Self Care | Payer: 59 | Source: Ambulatory Visit

## 2015-07-05 DIAGNOSIS — Z1231 Encounter for screening mammogram for malignant neoplasm of breast: Secondary | ICD-10-CM

## 2015-07-08 ENCOUNTER — Other Ambulatory Visit: Payer: Self-pay | Admitting: Internal Medicine

## 2015-07-08 DIAGNOSIS — R928 Other abnormal and inconclusive findings on diagnostic imaging of breast: Secondary | ICD-10-CM

## 2015-07-10 ENCOUNTER — Ambulatory Visit
Admission: RE | Admit: 2015-07-10 | Discharge: 2015-07-10 | Disposition: A | Discharge status: Home / Self Care | Payer: 59 | Source: Ambulatory Visit | Attending: Internal Medicine | Admitting: Internal Medicine

## 2015-07-10 DIAGNOSIS — R928 Other abnormal and inconclusive findings on diagnostic imaging of breast: Secondary | ICD-10-CM

## 2015-07-17 ENCOUNTER — Emergency Department (HOSPITAL_COMMUNITY): Payer: 59

## 2015-07-17 ENCOUNTER — Emergency Department (HOSPITAL_COMMUNITY)
Admission: EM | Admit: 2015-07-17 | Discharge: 2015-07-18 | Disposition: A | Discharge status: Home / Self Care | Payer: 59 | Attending: Emergency Medicine | Admitting: Emergency Medicine

## 2015-07-17 ENCOUNTER — Encounter (HOSPITAL_COMMUNITY): Payer: Self-pay | Admitting: Emergency Medicine

## 2015-07-17 DIAGNOSIS — E785 Hyperlipidemia, unspecified: Secondary | ICD-10-CM | POA: Diagnosis not present

## 2015-07-17 DIAGNOSIS — E119 Type 2 diabetes mellitus without complications: Secondary | ICD-10-CM | POA: Insufficient documentation

## 2015-07-17 DIAGNOSIS — Z8619 Personal history of other infectious and parasitic diseases: Secondary | ICD-10-CM | POA: Insufficient documentation

## 2015-07-17 DIAGNOSIS — Y9289 Other specified places as the place of occurrence of the external cause: Secondary | ICD-10-CM | POA: Diagnosis not present

## 2015-07-17 DIAGNOSIS — Z79899 Other long term (current) drug therapy: Secondary | ICD-10-CM | POA: Insufficient documentation

## 2015-07-17 DIAGNOSIS — Y9389 Activity, other specified: Secondary | ICD-10-CM | POA: Insufficient documentation

## 2015-07-17 DIAGNOSIS — S93402A Sprain of unspecified ligament of left ankle, initial encounter: Secondary | ICD-10-CM | POA: Diagnosis not present

## 2015-07-17 DIAGNOSIS — G4730 Sleep apnea, unspecified: Secondary | ICD-10-CM | POA: Insufficient documentation

## 2015-07-17 DIAGNOSIS — Z794 Long term (current) use of insulin: Secondary | ICD-10-CM | POA: Insufficient documentation

## 2015-07-17 DIAGNOSIS — W108XXA Fall (on) (from) other stairs and steps, initial encounter: Secondary | ICD-10-CM | POA: Diagnosis not present

## 2015-07-17 DIAGNOSIS — Z9981 Dependence on supplemental oxygen: Secondary | ICD-10-CM | POA: Diagnosis not present

## 2015-07-17 DIAGNOSIS — Y998 Other external cause status: Secondary | ICD-10-CM | POA: Insufficient documentation

## 2015-07-17 DIAGNOSIS — I10 Essential (primary) hypertension: Secondary | ICD-10-CM | POA: Insufficient documentation

## 2015-07-17 DIAGNOSIS — S99912A Unspecified injury of left ankle, initial encounter: Secondary | ICD-10-CM | POA: Diagnosis present

## 2015-07-17 DIAGNOSIS — J45909 Unspecified asthma, uncomplicated: Secondary | ICD-10-CM | POA: Diagnosis not present

## 2015-07-17 DIAGNOSIS — Z6841 Body Mass Index (BMI) 40.0 and over, adult: Secondary | ICD-10-CM | POA: Diagnosis not present

## 2015-07-17 NOTE — ED Notes (Signed)
Pt. missed her step / tripped and fell this evening , no LOC /ambulatory , report pain and swelling at left ankle and left foot.

## 2015-07-18 MED ORDER — IBUPROFEN 600 MG PO TABS: 600.0000 mg | ORAL_TABLET | Freq: Three times a day (TID) | ORAL | Status: DC | PRN

## 2015-07-18 NOTE — ED Provider Notes (Signed)
CSN: [id number]     Arrival date & time 07/17/15  2257 History   First MD Initiated Contact with Patient 07/17/15 2344     Chief Complaint  Patient presents with  . Ankle Injury     Patient is a 40 y.o. female presenting with lower extremity injury. The history is provided by the patient.  Ankle Injury This is a new problem. The current episode started 1 to 2 hours ago. The problem occurs constantly. The problem has been gradually worsening. Pertinent negatives include no headaches. The symptoms are aggravated by walking. The symptoms are relieved by rest.  Pt with h/o hyperlipidemia presents after fall and twisting her left ankle She missed a step and "Rolled" her left ankle She reports she may have hit her knees on the fall but they are not painful   Past Medical History  Diagnosis Date  . Hyperlipidemia   . Obesity, Class III, BMI 40-49.9 (morbid obesity) ([place])   . Allergic rhinitis   . HSV-1 infection 2010  . HSV-2 infection 2010  . Abnormal Pap smear 04/2010  . H/O varicella   . H/O candidiasis   . Shortness of breath   . Asthma     inhaler use 3-4 x per day since pregnant  . Sleep apnea     CPAP machine  . Diabetes mellitus   . HTN (hypertension)    Past Surgical History  Procedure Laterality Date  . Refractive surgery    . Ovarian cyst removal  2003    left  . Tonsillectomy      age 45  . Wisdom tooth extraction  2009  . Cesarean section     Family History  Problem Relation Age of Onset  . Asthma Mother   . Hyperlipidemia Mother   . Hypertension Mother   . Asthma Father   . Diabetes Father   . Hypertension Father   . Asthma Sister   . Mental illness Sister   . Heart attack Paternal Grandmother    Social History  Substance Use Topics  . Smoking status: Never Smoker   . Smokeless tobacco: Never Used  . Alcohol Use: No   OB History    Gravida Para Term Preterm AB TAB SAB Ectopic Multiple Living   6 2 2  4  2   2      Review of Systems   Constitutional: Negative for fever.  Musculoskeletal: Positive for arthralgias.  Neurological: Negative for headaches.      Allergies  Review of patient's allergies indicates no known allergies.  Home Medications   Prior to Admission medications   Medication Sig Start Date End Date Taking? Authorizing Provider  albuterol (PROVENTIL HFA;VENTOLIN HFA) 108 (90 BASE) MCG/ACT inhaler Inhale 2 puffs into the lungs every 6 (six) hours as needed. For wheezing    Historical Provider, MD  glucose blood (ONETOUCH VERIO) test strip Use as instructed to check blood sugars once a day dx code E11.65 12/07/14   Elayne Snare, MD  Insulin Pen Needle (BD PEN NEEDLE NANO U/F) 32G X 4 MM MISC Use as directed 3 times per day 05/14/14   Elayne Snare, MD  lovastatin (MEVACOR) 20 MG tablet daily.  12/19/12   Historical Provider, MD  potassium chloride (K-DUR) 10 MEQ tablet Reported on 03/08/2015 09/04/14   Historical Provider, MD  spironolactone (ALDACTONE) 50 MG tablet Take 1 tablet (50 mg total) by mouth daily. 12/07/14   Elayne Snare, MD  SYMBICORT 160-4.5 MCG/ACT inhaler  12/23/13  Historical Provider, MD   BP 156/91 mmHg  Pulse 71  Resp 16  SpO2 100%  LMP 07/10/2015 (Approximate) Physical Exam CONSTITUTIONAL: Well developed/well nourished HEAD: Normocephalic/atraumatic EYES: EOMI ENMT: Mucous membranes moist NECK: supple no meningeal signs CV: S1/S2 noted LUNGS: Lungs are clear to auscultation bilaterally ABDOMEN: soft NEURO: Pt is awake/alert/appropriate, moves all extremitiesx4.  No facial droop.   EXTREMITIES: pulses normal/equal, full ROM, tenderness/swelling to left ankle, mostly over left lateral malleolus.  No distal foot tenderness noted.  No left prox fib tenderness noted Left achilles intact SKIN: warm, color normal PSYCH: no abnormalities of mood noted, alert and oriented to situation  ED Course  Procedures  SPLINT APPLICATION Date/Time: [id number] AM Authorized by: Sharyon Cable Consent:  Verbal consent obtained. Risks and benefits: risks, benefits and alternatives were discussed Consent given by: patient Splint applied by: orthopedic technician Location details: left ankle Splint type: walking boot Supplies used: boot Post-procedure: The splinted body part was neurovascularly unchanged following the procedure. Patient tolerance: Patient tolerated the procedure well with no immediate complications.    Imaging Review Dg Ankle Complete Left  07/17/2015  CLINICAL DATA:  Missed the last step rolling LEFT ankle this evening, pain and swelling all over, injury, initial encounter EXAM: LEFT ANKLE COMPLETE - 3+ VIEW COMPARISON:  None FINDINGS: Soft tissue swelling ankle and lower leg. Osseous mineralization normal. Joint spaces preserved. Small plantar calcaneal spur. No acute fracture, dislocation, or bone destruction. IMPRESSION: No acute osseous abnormalities. Electronically Signed   By: Lavonia Dana M.D.   On: 07/17/2015 23:32   I have personally reviewed and evaluated these  Image results as part of my medical decision-making.    Pt tolerated walking boot Discussed need for f/u if symptoms do not improve  MDM   Final diagnoses:  Left ankle sprain, initial encounter    Nursing notes including past medical history and social history reviewed and considered in documentation. xrays/imaging reviewed by myself and considered during evaluation     Ripley Fraise, MD 07/18/15 [phone]

## 2015-07-18 NOTE — Discharge Instructions (Signed)
Ankle Sprain °An ankle sprain is an injury to the strong, fibrous tissues (ligaments) that hold the bones of your ankle joint together.  °CAUSES °An ankle sprain is usually caused by a fall or by twisting your ankle. Ankle sprains most commonly occur when you step on the outer edge of your foot, and your ankle turns inward. People who participate in sports are more prone to these types of injuries.  °SYMPTOMS  °· Pain in your ankle. The pain may be present at rest or only when you are trying to stand or walk. °· Swelling. °· Bruising. Bruising may develop immediately or within 1 to 2 days after your injury. °· Difficulty standing or walking, particularly when turning corners or changing directions. °DIAGNOSIS  °Your caregiver will ask you details about your injury and perform a physical exam of your ankle to determine if you have an ankle sprain. During the physical exam, your caregiver will press on and apply pressure to specific areas of your foot and ankle. Your caregiver will try to move your ankle in certain ways. An X-ray exam may be done to be sure a bone was not broken or a ligament did not separate from one of the bones in your ankle (avulsion fracture).  °TREATMENT  °Certain types of braces can help stabilize your ankle. Your caregiver can make a recommendation for this. Your caregiver may recommend the use of medicine for pain. If your sprain is severe, your caregiver may refer you to a surgeon who helps to restore function to parts of your skeletal system (orthopedist) or a physical therapist. °HOME CARE INSTRUCTIONS  °· Apply ice to your injury for 1-2 days or as directed by your caregiver. Applying ice helps to reduce inflammation and pain. °· Put ice in a plastic bag. °· Place a towel between your skin and the bag. °· Leave the ice on for 15-20 minutes at a time, every 2 hours while you are awake. °· Only take over-the-counter or prescription medicines for pain, discomfort, or fever as directed by  your caregiver. °· Elevate your injured ankle above the level of your heart as much as possible for 2-3 days. °· If your caregiver recommends crutches, use them as instructed. Gradually put weight on the affected ankle. Continue to use crutches or a cane until you can walk without feeling pain in your ankle. °· If you have a plaster splint, wear the splint as directed by your caregiver. Do not rest it on anything harder than a pillow for the first 24 hours. Do not put weight on it. Do not get it wet. You may take it off to take a shower or bath. °· You may have been given an elastic bandage to wear around your ankle to provide support. If the elastic bandage is too tight (you have numbness or tingling in your foot or your foot becomes cold and blue), adjust the bandage to make it comfortable. °· If you have an air splint, you may blow more air into it or let air out to make it more comfortable. You may take your splint off at night and before taking a shower or bath. Wiggle your toes in the splint several times per day to decrease swelling. °SEEK MEDICAL CARE IF:  °· You have rapidly increasing bruising or swelling. °· Your toes feel extremely cold or you lose feeling in your foot. °· Your pain is not relieved with medicine. °SEEK IMMEDIATE MEDICAL CARE IF: °· Your toes are numb or blue. °·   You have severe pain that is increasing. °MAKE SURE YOU:  °· Understand these instructions. °· Will watch your condition. °· Will get help right away if you are not doing well or get worse. °  °This information is not intended to replace advice given to you by your health care provider. Make sure you discuss any questions you have with your health care provider. °  °Document Released: 02/16/2005 Document Revised: 03/09/2014 Document Reviewed: 02/28/2011 °Elsevier Interactive Patient Education ©2016 [employer] ° °Cryotherapy °Cryotherapy means treatment with cold. Ice or gel packs can be used to reduce both pain and swelling.  Ice is the most helpful within the first 24 to 48 hours after an injury or flare-up from overusing a muscle or joint. Sprains, strains, spasms, burning pain, shooting pain, and aches can all be eased with ice. Ice can also be used when recovering from surgery. Ice is effective, has very few side effects, and is safe for most people to use. °PRECAUTIONS  °Ice is not a safe treatment option for people with: °· Raynaud phenomenon. This is a condition affecting small blood vessels in the extremities. Exposure to cold may cause your problems to return. °· Cold hypersensitivity. There are many forms of cold hypersensitivity, including: °¨ Cold urticaria. Red, itchy hives appear on the skin when the tissues begin to warm after being iced. °¨ Cold erythema. This is a red, itchy rash caused by exposure to cold. °¨ Cold hemoglobinuria. Red blood cells break down when the tissues begin to warm after being iced. The hemoglobin that carry oxygen are passed into the urine because they cannot combine with blood proteins fast enough. °· Numbness or altered sensitivity in the area being iced. °If you have any of the following conditions, do not use ice until you have discussed cryotherapy with your caregiver: °· Heart conditions, such as arrhythmia, angina, or chronic heart disease. °· High blood pressure. °· Healing wounds or open skin in the area being iced. °· Current infections. °· Rheumatoid arthritis. °· Poor circulation. °· Diabetes. °Ice slows the blood flow in the region it is applied. This is beneficial when trying to stop inflamed tissues from spreading irritating chemicals to surrounding tissues. However, if you expose your skin to cold temperatures for too long or without the proper protection, you can damage your skin or nerves. Watch for signs of skin damage due to cold. °HOME CARE INSTRUCTIONS °Follow these tips to use ice and cold packs safely. °· Place a dry or damp towel between the ice and skin. A damp towel will  cool the skin more quickly, so you may need to shorten the time that the ice is used. °· For a more rapid response, add gentle compression to the ice. °· Ice for no more than 10 to 20 minutes at a time. The bonier the area you are icing, the less time it will take to get the benefits of ice. °· Check your skin after 5 minutes to make sure there are no signs of a poor response to cold or skin damage. °· Rest 20 minutes or more between uses. °· Once your skin is numb, you can end your treatment. You can test numbness by very lightly touching your skin. The touch should be so light that you do not see the skin dimple from the pressure of your fingertip. When using ice, most people will feel these normal sensations in this order: cold, burning, aching, and numbness. °· Do not use ice on someone who   cannot communicate their responses to pain, such as small children or people with dementia. °HOW TO MAKE AN ICE PACK °Ice packs are the most common way to use ice therapy. Other methods include ice massage, ice baths, and cryosprays. Muscle creams that cause a cold, tingly feeling do not offer the same benefits that ice offers and should not be used as a substitute unless recommended by your caregiver. °To make an ice pack, do one of the following: °· Place crushed ice or a bag of frozen vegetables in a sealable plastic bag. Squeeze out the excess air. Place this bag inside another plastic bag. Slide the bag into a pillowcase or place a damp towel between your skin and the bag. °· Mix 3 parts water with 1 part rubbing alcohol. Freeze the mixture in a sealable plastic bag. When you remove the mixture from the freezer, it will be slushy. Squeeze out the excess air. Place this bag inside another plastic bag. Slide the bag into a pillowcase or place a damp towel between your skin and the bag. °SEEK MEDICAL CARE IF: °· You develop white spots on your skin. This may give the skin a blotchy (mottled) appearance. °· Your skin turns  blue or pale. °· Your skin becomes waxy or hard. °· Your swelling gets worse. °MAKE SURE YOU:  °· Understand these instructions. °· Will watch your condition. °· Will get help right away if you are not doing well or get worse. °  °This information is not intended to replace advice given to you by your health care provider. Make sure you discuss any questions you have with your health care provider. °  °Document Released: 10/13/2010 Document Revised: 03/09/2014 Document Reviewed: 10/13/2010 °Elsevier Interactive Patient Education ©2016 [employer] Inc[employer] ° °

## 2015-07-18 NOTE — Progress Notes (Signed)
Orthopedic Tech Progress Note Patient Details:  Tina Flores 1976/01/28 [id number]  Ortho Devices Type of Ortho Device: CAM walker Ortho Device/Splint Location: lle Ortho Device/Splint Interventions: Ordered, Application   Karolee Stamps 07/18/2015, 12:29 AM

## 2015-09-06 DIAGNOSIS — M25572 Pain in left ankle and joints of left foot: Secondary | ICD-10-CM | POA: Diagnosis not present

## 2015-09-06 DIAGNOSIS — M25562 Pain in left knee: Secondary | ICD-10-CM | POA: Diagnosis not present

## 2015-09-13 DIAGNOSIS — J309 Allergic rhinitis, unspecified: Secondary | ICD-10-CM | POA: Diagnosis not present

## 2015-09-13 DIAGNOSIS — J45909 Unspecified asthma, uncomplicated: Secondary | ICD-10-CM | POA: Diagnosis not present

## 2015-09-13 DIAGNOSIS — Z0000 Encounter for general adult medical examination without abnormal findings: Secondary | ICD-10-CM | POA: Diagnosis not present

## 2015-09-13 DIAGNOSIS — Z1389 Encounter for screening for other disorder: Secondary | ICD-10-CM | POA: Diagnosis not present

## 2015-09-13 DIAGNOSIS — E119 Type 2 diabetes mellitus without complications: Secondary | ICD-10-CM | POA: Diagnosis not present

## 2015-09-13 DIAGNOSIS — I10 Essential (primary) hypertension: Secondary | ICD-10-CM | POA: Diagnosis not present

## 2015-09-13 DIAGNOSIS — E7800 Pure hypercholesterolemia, unspecified: Secondary | ICD-10-CM | POA: Diagnosis not present

## 2015-09-13 MED FILL — AMLODIPINE-BENAZEPRIL 5-20: 5-20 | 90 days supply | Qty: 90 | Fill #0

## 2015-09-13 MED FILL — VENTOLIN HFA 90 MCG INHALER: 108 (90 BAS | 16 days supply | Qty: 18 | Fill #0

## 2015-09-13 MED FILL — SPIRONOLACTONE 50 MG TABLET: 50 | 90 days supply | Qty: 135 | Fill #0

## 2015-09-24 DIAGNOSIS — M25562 Pain in left knee: Secondary | ICD-10-CM | POA: Diagnosis not present

## 2015-10-01 ENCOUNTER — Other Ambulatory Visit (HOSPITAL_COMMUNITY): Payer: Self-pay | Admitting: Family Medicine

## 2015-10-01 ENCOUNTER — Encounter (HOSPITAL_COMMUNITY): Payer: Self-pay

## 2015-10-01 ENCOUNTER — Ambulatory Visit (HOSPITAL_COMMUNITY)
Admission: RE | Admit: 2015-10-01 | Discharge: 2015-10-01 | Disposition: A | Discharge status: Home / Self Care | Payer: 59 | Source: Ambulatory Visit | Attending: Family Medicine | Admitting: Family Medicine

## 2015-10-01 DIAGNOSIS — M25462 Effusion, left knee: Secondary | ICD-10-CM | POA: Insufficient documentation

## 2015-10-01 DIAGNOSIS — R609 Edema, unspecified: Secondary | ICD-10-CM

## 2015-10-01 DIAGNOSIS — M25562 Pain in left knee: Secondary | ICD-10-CM | POA: Diagnosis not present

## 2015-10-01 DIAGNOSIS — M660 Rupture of popliteal cyst: Secondary | ICD-10-CM | POA: Diagnosis not present

## 2015-10-01 DIAGNOSIS — R600 Localized edema: Secondary | ICD-10-CM | POA: Diagnosis not present

## 2015-10-04 DIAGNOSIS — M25562 Pain in left knee: Secondary | ICD-10-CM | POA: Diagnosis not present

## 2015-12-12 MED FILL — VENTOLIN HFA 90 MCG INHALER: 108 (90 BAS | 16 days supply | Qty: 18 | Fill #1

## 2015-12-12 MED FILL — SPIRONOLACTONE 50 MG TABLET: 50 | 90 days supply | Qty: 135 | Fill #1

## 2015-12-12 MED FILL — AMLODIPINE-BENAZEPRIL 5-20: 5-20 | 90 days supply | Qty: 90 | Fill #1

## 2015-12-17 ENCOUNTER — Other Ambulatory Visit: Payer: Self-pay | Admitting: Internal Medicine

## 2015-12-17 DIAGNOSIS — N6310 Unspecified lump in the right breast, unspecified quadrant: Secondary | ICD-10-CM

## 2016-01-08 DIAGNOSIS — Z124 Encounter for screening for malignant neoplasm of cervix: Secondary | ICD-10-CM | POA: Diagnosis not present

## 2016-01-08 DIAGNOSIS — Z113 Encounter for screening for infections with a predominantly sexual mode of transmission: Secondary | ICD-10-CM | POA: Diagnosis not present

## 2016-01-08 DIAGNOSIS — B009 Herpesviral infection, unspecified: Secondary | ICD-10-CM | POA: Diagnosis not present

## 2016-01-08 DIAGNOSIS — Z01411 Encounter for gynecological examination (general) (routine) with abnormal findings: Secondary | ICD-10-CM | POA: Diagnosis not present

## 2016-01-08 DIAGNOSIS — Z6835 Body mass index (BMI) 35.0-35.9, adult: Secondary | ICD-10-CM | POA: Diagnosis not present

## 2016-01-08 MED FILL — VALACYCLOVIR HCL 500 MG TAB: 500 | 30 days supply | Qty: 30 | Fill #0

## 2016-01-13 MED FILL — FLUCONAZOLE 150 MG TABLET: 150 | 1 days supply | Qty: 1 | Fill #0

## 2016-01-14 ENCOUNTER — Ambulatory Visit
Admission: RE | Admit: 2016-01-14 | Discharge: 2016-01-14 | Disposition: A | Discharge status: Home / Self Care | Payer: 59 | Source: Ambulatory Visit | Attending: Internal Medicine | Admitting: Internal Medicine

## 2016-01-14 DIAGNOSIS — N6310 Unspecified lump in the right breast, unspecified quadrant: Secondary | ICD-10-CM

## 2016-01-14 MED FILL — VENTOLIN HFA 90 MCG INHALER: 108 (90 BAS | 16 days supply | Qty: 18 | Fill #2

## 2016-02-01 ENCOUNTER — Emergency Department (HOSPITAL_BASED_OUTPATIENT_CLINIC_OR_DEPARTMENT_OTHER): Payer: 59

## 2016-02-01 ENCOUNTER — Emergency Department (HOSPITAL_BASED_OUTPATIENT_CLINIC_OR_DEPARTMENT_OTHER)
Admission: EM | Admit: 2016-02-01 | Discharge: 2016-02-01 | Disposition: A | Discharge status: Home / Self Care | Payer: 59 | Attending: Emergency Medicine | Admitting: Emergency Medicine

## 2016-02-01 ENCOUNTER — Encounter (HOSPITAL_BASED_OUTPATIENT_CLINIC_OR_DEPARTMENT_OTHER): Payer: Self-pay | Admitting: *Deleted

## 2016-02-01 DIAGNOSIS — R079 Chest pain, unspecified: Secondary | ICD-10-CM | POA: Diagnosis not present

## 2016-02-01 DIAGNOSIS — E119 Type 2 diabetes mellitus without complications: Secondary | ICD-10-CM | POA: Diagnosis not present

## 2016-02-01 DIAGNOSIS — B349 Viral infection, unspecified: Secondary | ICD-10-CM

## 2016-02-01 DIAGNOSIS — I10 Essential (primary) hypertension: Secondary | ICD-10-CM | POA: Insufficient documentation

## 2016-02-01 DIAGNOSIS — J45909 Unspecified asthma, uncomplicated: Secondary | ICD-10-CM | POA: Insufficient documentation

## 2016-02-01 DIAGNOSIS — R05 Cough: Secondary | ICD-10-CM | POA: Diagnosis not present

## 2016-02-01 DIAGNOSIS — R509 Fever, unspecified: Secondary | ICD-10-CM

## 2016-02-01 LAB — URINALYSIS, ROUTINE W REFLEX MICROSCOPIC
Bilirubin Urine: NEGATIVE
GLUCOSE, UA: NEGATIVE mg/dL
HGB URINE DIPSTICK: NEGATIVE
Ketones, ur: NEGATIVE mg/dL
Leukocytes, UA: NEGATIVE
Nitrite: NEGATIVE
PH: 6 (ref 5.0–8.0)
Protein, ur: 100 mg/dL — AB
SPECIFIC GRAVITY, URINE: 1.029 (ref 1.005–1.030)

## 2016-02-01 LAB — CBC WITH DIFFERENTIAL/PLATELET
Basophils Absolute: 0 10*3/uL (ref 0.0–0.1)
Basophils Relative: 0 %
EOS PCT: 0 %
Eosinophils Absolute: 0 10*3/uL (ref 0.0–0.7)
HCT: 35.1 % — ABNORMAL LOW (ref 36.0–46.0)
Hemoglobin: 11.6 g/dL — ABNORMAL LOW (ref 12.0–15.0)
LYMPHS ABS: 0.6 10*3/uL — AB (ref 0.7–4.0)
LYMPHS PCT: 13 %
MCH: 27 pg (ref 26.0–34.0)
MCHC: 33 g/dL (ref 30.0–36.0)
MCV: 81.8 fL (ref 78.0–100.0)
Monocytes Absolute: 0.4 10*3/uL (ref 0.1–1.0)
Monocytes Relative: 8 %
Neutro Abs: 3.4 10*3/uL (ref 1.7–7.7)
Neutrophils Relative %: 79 %
PLATELETS: 250 10*3/uL (ref 150–400)
RBC: 4.29 MIL/uL (ref 3.87–5.11)
RDW: 13.7 % (ref 11.5–15.5)
WBC: 4.3 10*3/uL (ref 4.0–10.5)

## 2016-02-01 LAB — HEPATIC FUNCTION PANEL
ALK PHOS: 124 U/L (ref 38–126)
ALT: 30 U/L (ref 14–54)
AST: 25 U/L (ref 15–41)
Albumin: 4 g/dL (ref 3.5–5.0)
BILIRUBIN DIRECT: 0.1 mg/dL (ref 0.1–0.5)
BILIRUBIN TOTAL: 0.5 mg/dL (ref 0.3–1.2)
Indirect Bilirubin: 0.4 mg/dL (ref 0.3–0.9)
Total Protein: 7.7 g/dL (ref 6.5–8.1)

## 2016-02-01 LAB — BASIC METABOLIC PANEL
Anion gap: 8 (ref 5–15)
BUN: 8 mg/dL (ref 6–20)
CHLORIDE: 107 mmol/L (ref 101–111)
CO2: 18 mmol/L — ABNORMAL LOW (ref 22–32)
Calcium: 8.8 mg/dL — ABNORMAL LOW (ref 8.9–10.3)
Creatinine, Ser: 0.86 mg/dL (ref 0.44–1.00)
GFR calc Af Amer: >60 mL/min (ref >60)
GLUCOSE: 197 mg/dL — AB (ref 65–99)
POTASSIUM: 3.3 mmol/L — AB (ref 3.5–5.1)
Sodium: 133 mmol/L — ABNORMAL LOW (ref 135–145)

## 2016-02-01 LAB — URINE MICROSCOPIC-ADD ON

## 2016-02-01 LAB — PREGNANCY, URINE: Preg Test, Ur: NEGATIVE

## 2016-02-01 LAB — I-STAT CG4 LACTIC ACID, ED: Lactic Acid, Venous: 1.07 mmol/L (ref 0.5–1.9)

## 2016-02-01 MED ORDER — POTASSIUM CHLORIDE CRYS ER 20 MEQ PO TBCR
40.0000 meq | EXTENDED_RELEASE_TABLET | Freq: Once | ORAL | Status: AC
  Administered 2016-02-01: 40 meq via ORAL
  Filled 2016-02-01: qty 2

## 2016-02-01 MED ORDER — LACTATED RINGERS IV BOLUS (SEPSIS)
1000.0000 mL | Freq: Once | INTRAVENOUS | Status: AC
  Administered 2016-02-01: 1000 mL via INTRAVENOUS

## 2016-02-01 MED ORDER — ONDANSETRON HCL 4 MG/2ML IJ SOLN
4.0000 mg | Freq: Once | INTRAMUSCULAR | Status: AC
  Administered 2016-02-01: 4 mg via INTRAVENOUS
  Filled 2016-02-01: qty 2

## 2016-02-01 MED ORDER — SODIUM CHLORIDE 0.9 % IV BOLUS (SEPSIS)
1000.0000 mL | Freq: Once | INTRAVENOUS | Status: AC
  Administered 2016-02-01: 1000 mL via INTRAVENOUS

## 2016-02-01 NOTE — ED Notes (Signed)
Pt given d/c instructions as per chart. Verbalizes understanding. No questions. 

## 2016-02-01 NOTE — ED Provider Notes (Signed)
[facility] Provider Note   CSN: [id number] Arrival date & time: 02/01/16  [id number]  By signing my name below, I, Tina Flores, attest that this documentation has been prepared under the direction and in the presence of Tina Reichert, MD . Electronically Signed: Neta Flores, ED Scribe. 02/01/2016. 4:42 PM.    History   Chief Complaint Chief Complaint  Patient presents with  . Fever   The history is provided by the patient. No language interpreter was used.   HPI Comments:  Tina Flores is a 40 y.o. female with PMHx of HTN who presents to the Emergency Department complaining of a constant fever that began yesterday afternoon. Pt measured her temperature at 101-102 at home multiple times. Pt complains of associated chills, generalized body aches, diarrhea, and nausea. Pt took a tylenol at 1pm today with mild relief. Pt denies chance of pregnancy, denies sick contact and denies eating anything unusual. Pt denies cough, sore throat, rhinorrhea, abdominal pain, dysuria, vomiting.   Past Medical History:  Diagnosis Date  . Abnormal Pap smear 04/2010  . Allergic rhinitis   . Asthma    inhaler use 3-4 x per day since pregnant  . Diabetes mellitus   . H/O candidiasis   . H/O varicella   . HSV-1 infection 2010  . HSV-2 infection 2010  . HTN (hypertension)   . Hyperlipidemia   . Obesity, Class III, BMI 40-49.9 (morbid obesity) ([place])   . Shortness of breath   . Sleep apnea    CPAP machine    Patient Active Problem List   Diagnosis Date Noted  . Other and unspecified hyperlipidemia 01/13/2013  . Status post repeat low transverse cesarean section 10/21/2011  . Asthma 09/17/2011  . H/O cesarean section 08/24/2011  . Umbilical cord, single artery and vein 06/24/2011  . AMA (advanced maternal age) multigravida 35+ 06/05/2011  . BMI 50.0-59.9, adult ([place]) 06/05/2011  . Hypertension 06/05/2011  . Type II or unspecified type diabetes mellitus without  mention of complication, uncontrolled 06/05/2011    Past Surgical History:  Procedure Laterality Date  . CESAREAN SECTION    . OVARIAN CYST REMOVAL  2003   left  . REFRACTIVE SURGERY    . TONSILLECTOMY     age 41  . WISDOM TOOTH EXTRACTION  2009    OB History    Gravida Para Term Preterm AB Living   6 2 2   4 2    SAB TAB Ectopic Multiple Live Births   2       2       Home Medications    Prior to Admission medications   Not on File    Family History Family History  Problem Relation Age of Onset  . Asthma Mother   . Hyperlipidemia Mother   . Hypertension Mother   . Asthma Father   . Diabetes Father   . Hypertension Father   . Asthma Sister   . Mental illness Sister   . Heart attack Paternal Grandmother     Social History Social History  Substance Use Topics  . Smoking status: Never Smoker  . Smokeless tobacco: Never Used  . Alcohol use No     Allergies   Patient has no known allergies.   Review of Systems Review of Systems  Constitutional: Positive for chills and fever.  HENT: Negative for rhinorrhea and sore throat.   Respiratory: Negative for cough.   Gastrointestinal: Positive for diarrhea and nausea. Negative for abdominal distention  and vomiting.  Genitourinary: Negative for dysuria.  Musculoskeletal: Positive for myalgias.  All other systems reviewed and are negative.    Physical Exam Updated Vital Signs BP 115/66 (BP Location: Right Arm)   Pulse 83   Temp 99.1 F (37.3 C) (Oral)   Resp 16   Ht 5\' 7"  (1.702 m)   Wt 230 lb (104.3 kg)   LMP 01/15/2016   SpO2 100%   BMI 36.02 kg/m   Physical Exam  Constitutional: She is oriented to person, place, and time. She appears well-developed and well-nourished.  Appears uncomfortable.   HENT:  Head: Normocephalic and atraumatic.  Cardiovascular: Normal rate and regular rhythm.   No murmur heard. Pulmonary/Chest: Effort normal and breath sounds normal. No respiratory distress.    Abdominal: Soft. There is no tenderness. There is no rebound and no guarding.  Mild diffuse abdominal tenderness.  Musculoskeletal: She exhibits no edema or tenderness.  Neurological: She is alert and oriented to person, place, and time.  Skin: Skin is warm and dry.  Psychiatric: She has a normal mood and affect. Her behavior is normal.  Nursing note and vitals reviewed.    ED Treatments / Results  DIAGNOSTIC STUDIES:  Oxygen Saturation is 94% on RA, adequate by my interpretation.    COORDINATION OF CARE:  4:42 PM Discussed treatment plan with pt at bedside and pt agreed to plan.   Labs (all labs ordered are listed, but only abnormal results are displayed) Labs Reviewed  CBC WITH DIFFERENTIAL/PLATELET - Abnormal; Notable for the following:       Result Value   Hemoglobin 11.6 (*)    HCT 35.1 (*)    Lymphs Abs 0.6 (*)    All other components within normal limits  BASIC METABOLIC PANEL - Abnormal; Notable for the following:    Sodium 133 (*)    Potassium 3.3 (*)    CO2 18 (*)    Glucose, Bld 197 (*)    Calcium 8.8 (*)    All other components within normal limits  URINALYSIS, ROUTINE W REFLEX MICROSCOPIC (NOT AT [facility]) - Abnormal; Notable for the following:    Color, Urine AMBER (*)    APPearance CLOUDY (*)    Protein, ur 100 (*)    All other components within normal limits  URINE MICROSCOPIC-ADD ON - Abnormal; Notable for the following:    Squamous Epithelial / LPF 0-5 (*)    Bacteria, UA RARE (*)    Casts WBC CAST (*)    All other components within normal limits  PREGNANCY, URINE  HEPATIC FUNCTION PANEL  I-STAT CG4 LACTIC ACID, ED    EKG  EKG Interpretation None       Radiology Dg Chest 2 View  Result Date: 02/01/2016 CLINICAL DATA:  Chills, body aches, cough, congestion, n/v x 24 hrs. No chest pain. Non smoker. Htn. No diab. No hx mi or stroke. EXAM: CHEST  2 VIEW COMPARISON:  12/04/2013 FINDINGS: The heart size and mediastinal contours are within normal  limits. Both lungs are clear. No pleural effusion or pneumothorax. The visualized skeletal structures are unremarkable. IMPRESSION: No active cardiopulmonary disease. Electronically Signed   By: Lajean Manes M.D.   On: 02/01/2016 18:09    Procedures Procedures (including critical care time)  Medications Ordered in ED Medications  sodium chloride 0.9 % bolus 1,000 mL (0 mLs Intravenous Stopped 02/01/16 1746)  ondansetron (ZOFRAN) injection 4 mg (4 mg Intravenous Given 02/01/16 1700)  lactated ringers bolus 1,000 mL (0 mLs Intravenous  Stopped 02/01/16 2011)  potassium chloride SA (K-DUR,KLOR-CON) CR tablet 40 mEq (40 mEq Oral Given 02/01/16 1833)     Initial Impression / Assessment and Plan / ED Course  I have reviewed the triage vital signs and the nursing notes.  Pertinent labs & imaging results that were available during my care of the patient were reviewed by me and considered in my medical decision making (see chart for details).  Clinical Course     Patient here with fever, chills, malaise, nausea, diarrhea. She is nontoxic appearing on examination. No evidence of acute pneumonia, appendicitis, sepsis. Following treatment with IV fluids in the emergency department she is feeling improved. She does have mild dehydration based on her labs and exam and mild hypokalemia that was replaced orally. Discussed with patient likely a viral illness and home care with oral fluid hydration and treating her fevers. Discussed close return precautions as well as an outpatient follow-up.  Final Clinical Impressions(s) / ED Diagnoses   Final diagnoses:  Viral illness  Fever, unspecified fever cause    New Prescriptions There are no discharge medications for this patient. I personally performed the services described in this documentation, which was scribed in my presence. The recorded information has been reviewed and is accurate.     Tina Reichert, MD 02/02/16 [phone]

## 2016-02-01 NOTE — ED Notes (Signed)
Up to BR for u/a, gait steady

## 2016-02-01 NOTE — ED Notes (Signed)
Patient denies pain.

## 2016-02-01 NOTE — ED Notes (Signed)
Patient transported to X-ray 

## 2016-02-01 NOTE — ED Notes (Signed)
States 'feels much better" given saltines and gingerale, tol well

## 2016-02-01 NOTE — ED Triage Notes (Signed)
Fever, diarrhea since yesterday.  States that she had tylenol at 1pm today.

## 2016-02-04 ENCOUNTER — Emergency Department (HOSPITAL_COMMUNITY)
Admission: EM | Admit: 2016-02-04 | Discharge: 2016-02-04 | Disposition: A | Discharge status: Home / Self Care | Payer: 59 | Attending: Emergency Medicine | Admitting: Emergency Medicine

## 2016-02-04 ENCOUNTER — Encounter (HOSPITAL_COMMUNITY): Payer: Self-pay | Admitting: Emergency Medicine

## 2016-02-04 DIAGNOSIS — J45909 Unspecified asthma, uncomplicated: Secondary | ICD-10-CM | POA: Insufficient documentation

## 2016-02-04 DIAGNOSIS — R404 Transient alteration of awareness: Secondary | ICD-10-CM | POA: Diagnosis not present

## 2016-02-04 DIAGNOSIS — K529 Noninfective gastroenteritis and colitis, unspecified: Secondary | ICD-10-CM | POA: Insufficient documentation

## 2016-02-04 DIAGNOSIS — I10 Essential (primary) hypertension: Secondary | ICD-10-CM | POA: Diagnosis not present

## 2016-02-04 DIAGNOSIS — E860 Dehydration: Secondary | ICD-10-CM | POA: Diagnosis not present

## 2016-02-04 DIAGNOSIS — E119 Type 2 diabetes mellitus without complications: Secondary | ICD-10-CM | POA: Insufficient documentation

## 2016-02-04 DIAGNOSIS — R112 Nausea with vomiting, unspecified: Secondary | ICD-10-CM | POA: Diagnosis not present

## 2016-02-04 DIAGNOSIS — R531 Weakness: Secondary | ICD-10-CM | POA: Diagnosis not present

## 2016-02-04 LAB — COMPREHENSIVE METABOLIC PANEL
ALT: 24 U/L (ref 14–54)
ANION GAP: 12 (ref 5–15)
AST: 25 U/L (ref 15–41)
Albumin: 4.4 g/dL (ref 3.5–5.0)
Alkaline Phosphatase: 115 U/L (ref 38–126)
BILIRUBIN TOTAL: 0.4 mg/dL (ref 0.3–1.2)
BUN: 24 mg/dL — ABNORMAL HIGH (ref 6–20)
CHLORIDE: 105 mmol/L (ref 101–111)
CO2: 17 mmol/L — ABNORMAL LOW (ref 22–32)
Calcium: 9.1 mg/dL (ref 8.9–10.3)
Creatinine, Ser: 1.51 mg/dL — ABNORMAL HIGH (ref 0.44–1.00)
GFR, EST AFRICAN AMERICAN: 49 mL/min — AB (ref >60)
GFR, EST NON AFRICAN AMERICAN: 42 mL/min — AB (ref >60)
Glucose, Bld: 166 mg/dL — ABNORMAL HIGH (ref 65–99)
POTASSIUM: 2.9 mmol/L — AB (ref 3.5–5.1)
Sodium: 134 mmol/L — ABNORMAL LOW (ref 135–145)
TOTAL PROTEIN: 8.9 g/dL — AB (ref 6.5–8.1)

## 2016-02-04 LAB — URINALYSIS, ROUTINE W REFLEX MICROSCOPIC
Bilirubin Urine: NEGATIVE
GLUCOSE, UA: NEGATIVE mg/dL
HGB URINE DIPSTICK: NEGATIVE
Ketones, ur: NEGATIVE mg/dL
LEUKOCYTES UA: NEGATIVE
Nitrite: NEGATIVE
PROTEIN: 100 mg/dL — AB
SPECIFIC GRAVITY, URINE: 1.021 (ref 1.005–1.030)
pH: 6 (ref 5.0–8.0)

## 2016-02-04 LAB — CBC WITH DIFFERENTIAL/PLATELET
BASOS ABS: 0 10*3/uL (ref 0.0–0.1)
Basophils Relative: 0 %
EOS PCT: 0 %
Eosinophils Absolute: 0 10*3/uL (ref 0.0–0.7)
HCT: 40 % (ref 36.0–46.0)
Hemoglobin: 13.4 g/dL (ref 12.0–15.0)
LYMPHS ABS: 1 10*3/uL (ref 0.7–4.0)
LYMPHS PCT: 19 %
MCH: 26.4 pg (ref 26.0–34.0)
MCHC: 33.5 g/dL (ref 30.0–36.0)
MCV: 78.9 fL (ref 78.0–100.0)
MONO ABS: 0.7 10*3/uL (ref 0.1–1.0)
MONOS PCT: 13 %
Neutro Abs: 3.5 10*3/uL (ref 1.7–7.7)
Neutrophils Relative %: 68 %
PLATELETS: 301 10*3/uL (ref 150–400)
RBC: 5.07 MIL/uL (ref 3.87–5.11)
RDW: 13.7 % (ref 11.5–15.5)
WBC: 5.1 10*3/uL (ref 4.0–10.5)

## 2016-02-04 LAB — I-STAT BETA HCG BLOOD, ED (MC, WL, AP ONLY)

## 2016-02-04 LAB — LIPASE, BLOOD: LIPASE: 33 U/L (ref 11–51)

## 2016-02-04 MED ORDER — SODIUM CHLORIDE 0.9 % IV BOLUS (SEPSIS)
1000.0000 mL | Freq: Once | INTRAVENOUS | Status: AC
  Administered 2016-02-04: 1000 mL via INTRAVENOUS

## 2016-02-04 MED ORDER — ONDANSETRON HCL 4 MG/2ML IJ SOLN
4.0000 mg | Freq: Once | INTRAMUSCULAR | Status: AC
  Administered 2016-02-04: 4 mg via INTRAVENOUS
  Filled 2016-02-04: qty 2

## 2016-02-04 MED ORDER — POTASSIUM CHLORIDE ER 20 MEQ PO TBCR: 20.0000 meq | EXTENDED_RELEASE_TABLET | Freq: Every day | ORAL | 0 refills | Status: DC

## 2016-02-04 MED ORDER — POTASSIUM CHLORIDE CRYS ER 20 MEQ PO TBCR
40.0000 meq | EXTENDED_RELEASE_TABLET | Freq: Once | ORAL | Status: AC
  Administered 2016-02-04: 40 meq via ORAL
  Filled 2016-02-04: qty 2

## 2016-02-04 MED ORDER — ONDANSETRON HCL 4 MG PO TABS: 4.0000 mg | ORAL_TABLET | Freq: Four times a day (QID) | ORAL | 0 refills | Status: DC

## 2016-02-04 NOTE — ED Notes (Signed)
Bed: [id number] Expected date:  Expected time:  Means of arrival:  Comments: EMS- 40yo F, ab pain/n/v

## 2016-02-04 NOTE — Discharge Instructions (Signed)
Drink plenty of fluids Take potassium for the next week Take Zofran for nausea

## 2016-02-04 NOTE — ED Triage Notes (Addendum)
Per EMS patient comes from home for n/v/d that started on Friday.  Patient was seen at [facility] on Saturday and was told has virus.  Patient having vomiting x2 and diarrhea x5 today and reports feeling weak. Patient denies any pain at this time.   Patient given Zofran 4mg  via IV in route with EMS.

## 2016-02-04 NOTE — ED Notes (Signed)
Patient states that Pa told her she was going to order something through her IV.  Informed patient there was no orders, but I would check with PA.  Gerarda Gunther PA aware. Awaiting new orders.

## 2016-02-04 NOTE — ED Provider Notes (Signed)
[facility] DEPT Provider Note   CSN: [id number] Arrival date & time: 02/04/16  1049     History   Chief Complaint Chief Complaint  Patient presents with  . Emesis  . Diarrhea    HPI Tina Flores is a 40 y.o. female who presents with N/V/D for the past 4 days. It started with fever, chills, generalized body aches, nausea, and diarrhea. She went to [place] the next day and was diagnosed with a viral illness after rehydration, antiemetics, and potassium repletion. She states she felt better afterwards but after she went home she started to feel bad again. She started vomiting as well. Has not had fever or chills since Sunday (2 days ago) but has had ongoing vomiting and diarrhea. Denies abdominal pain but states her abdomen "feels tender". Reports decreased PO intake, lightheadedness, generalized weakness. Past surgical hx significant for bariatric surgery a year ago. Denies URI symptoms, cough, SOB, chest pain, flank pain, dysuria, vaginal discharge or bleeding.   HPI  Past Medical History:  Diagnosis Date  . Abnormal Pap smear 04/2010  . Allergic rhinitis   . Asthma    inhaler use 3-4 x per day since pregnant  . Diabetes mellitus   . H/O candidiasis   . H/O varicella   . HSV-1 infection 2010  . HSV-2 infection 2010  . HTN (hypertension)   . Hyperlipidemia   . Obesity, Class III, BMI 40-49.9 (morbid obesity) ([place])   . Shortness of breath   . Sleep apnea    CPAP machine    Patient Active Problem List   Diagnosis Date Noted  . Other and unspecified hyperlipidemia 01/13/2013  . Status post repeat low transverse cesarean section 10/21/2011  . Asthma 09/17/2011  . H/O cesarean section 08/24/2011  . Umbilical cord, single artery and vein 06/24/2011  . AMA (advanced maternal age) multigravida 35+ 06/05/2011  . BMI 50.0-59.9, adult ([place]) 06/05/2011  . Hypertension 06/05/2011  . Type II or unspecified type diabetes mellitus without mention of complication,  uncontrolled 06/05/2011    Past Surgical History:  Procedure Laterality Date  . CESAREAN SECTION    . OVARIAN CYST REMOVAL  2003   left  . REFRACTIVE SURGERY    . TONSILLECTOMY     age 27  . WISDOM TOOTH EXTRACTION  2009    OB History    Gravida Para Term Preterm AB Living   6 2 2   4 2    SAB TAB Ectopic Multiple Live Births   2       2       Home Medications    Prior to Admission medications   Not on File    Family History Family History  Problem Relation Age of Onset  . Asthma Mother   . Hyperlipidemia Mother   . Hypertension Mother   . Asthma Father   . Diabetes Father   . Hypertension Father   . Asthma Sister   . Mental illness Sister   . Heart attack Paternal Grandmother     Social History Social History  Substance Use Topics  . Smoking status: Never Smoker  . Smokeless tobacco: Never Used  . Alcohol use No     Allergies   Patient has no known allergies.   Review of Systems Review of Systems  Constitutional: Positive for appetite change. Negative for chills and fever.  Respiratory: Negative for cough and shortness of breath.   Cardiovascular: Negative for chest pain.  Gastrointestinal: Positive for diarrhea, nausea and  vomiting. Negative for abdominal pain and constipation.  Genitourinary: Positive for frequency. Negative for dysuria.  Neurological: Positive for weakness (generalized) and light-headedness.     Physical Exam Updated Vital Signs BP 115/82   Pulse 88   Temp 97.6 F (36.4 C)   Resp 16   Ht 5\' 7"  (1.702 m)   Wt 104.3 kg   LMP 01/15/2016   SpO2 99%   BMI 36.02 kg/m   Physical Exam  Constitutional: She is oriented to person, place, and time. She appears well-developed and well-nourished. No distress.  Appears fatigued  HENT:  Head: Normocephalic and atraumatic.  Eyes: Conjunctivae are normal. Pupils are equal, round, and reactive to light. Right eye exhibits no discharge. Left eye exhibits no discharge. No scleral  icterus.  Neck: Normal range of motion.  Cardiovascular: Normal rate and regular rhythm.  Exam reveals no gallop and no friction rub.   No murmur heard. Pulmonary/Chest: Effort normal and breath sounds normal. No respiratory distress. She has no wheezes. She has no rales. She exhibits no tenderness.  Abdominal: Soft. Bowel sounds are normal. She exhibits no distension and no mass. There is tenderness. There is no rebound and no guarding. No hernia.  Mild, generalized tenderness  Neurological: She is alert and oriented to person, place, and time.  Skin: Skin is warm and dry.  Psychiatric: She has a normal mood and affect. Her behavior is normal.  Nursing note and vitals reviewed.    ED Treatments / Results  Labs (all labs ordered are listed, but only abnormal results are displayed) Labs Reviewed  COMPREHENSIVE METABOLIC PANEL - Abnormal; Notable for the following:       Result Value   Sodium 134 (*)    Potassium 2.9 (*)    CO2 17 (*)    Glucose, Bld 166 (*)    BUN 24 (*)    Creatinine, Ser 1.51 (*)    Total Protein 8.9 (*)    GFR calc non Af Amer 42 (*)    GFR calc Af Amer 49 (*)    All other components within normal limits  URINALYSIS, ROUTINE W REFLEX MICROSCOPIC - Abnormal; Notable for the following:    Color, Urine AMBER (*)    APPearance CLOUDY (*)    Protein, ur 100 (*)    Bacteria, UA FEW (*)    Squamous Epithelial / LPF 0-5 (*)    All other components within normal limits  CBC WITH DIFFERENTIAL/PLATELET  LIPASE, BLOOD  I-STAT BETA HCG BLOOD, ED ([facility], [facility], [facility] ONLY)    EKG  EKG Interpretation None       Radiology No results found.  Procedures Procedures (including critical care time)  Medications Ordered in ED Medications  sodium chloride 0.9 % bolus 1,000 mL (0 mLs Intravenous Stopped 02/04/16 1346)  potassium chloride SA (K-DUR,KLOR-CON) CR tablet 40 mEq (40 mEq Oral Given 02/04/16 1250)  ondansetron (ZOFRAN) injection 4 mg (4 mg Intravenous Given 02/04/16  1307)  sodium chloride 0.9 % bolus 1,000 mL (0 mLs Intravenous Stopped 02/04/16 1553)     Initial Impression / Assessment and Plan / ED Course  I have reviewed the triage vital signs and the nursing notes.  Pertinent labs & imaging results that were available during my care of the patient were reviewed by me and considered in my medical decision making (see chart for details).  Clinical Course    40 year old female presents with ongoing viral illness with N/V/D. Abdominal exam is benign. Patient is  afebrile, not tachycardic or tachypneic, normotensive, and not hypoxic. CBC unremarkable. CMP remarkable for Na 134, K of 2.9, CO2 of 17, BUN 24 (up from 8 three days ago) , SCr 1.51 (up from 0.86 three days ago).   2L IVF, Zofran, Klor con given. After observation and treatment in the ED patient reports some symptomatic improvement. She is tolerating PO. Rx given for potassium and Zofran. Encouraged plenty of fluids. Patient is NAD, non-toxic, with stable VS. Patient is informed of clinical course, understands medical decision making process, and agrees with plan. Opportunity for questions provided and all questions answered. Return precautions given.  Final Clinical Impressions(s) / ED Diagnoses   Final diagnoses:  Dehydration  Gastroenteritis    New Prescriptions Discharge Medication List as of 02/04/2016  3:43 PM    START taking these medications   Details  ondansetron (ZOFRAN) 4 MG tablet Take 1 tablet (4 mg total) by mouth every 6 (six) hours., Starting Tue 02/04/2016, Print    potassium chloride 20 MEQ TBCR Take 20 mEq by mouth daily., Starting Tue 02/04/2016, Print         Recardo Evangelist, PA-C 02/04/16 1647    Fatima Blank, MD 02/06/16 [phone]

## 2016-02-04 NOTE — ED Notes (Signed)
Unable to collect labs at this time patient was in restroom

## 2016-02-05 DIAGNOSIS — E876 Hypokalemia: Secondary | ICD-10-CM | POA: Diagnosis not present

## 2016-02-05 DIAGNOSIS — R197 Diarrhea, unspecified: Secondary | ICD-10-CM | POA: Diagnosis not present

## 2016-02-05 DIAGNOSIS — K529 Noninfective gastroenteritis and colitis, unspecified: Secondary | ICD-10-CM | POA: Diagnosis not present

## 2016-02-06 ENCOUNTER — Encounter (HOSPITAL_BASED_OUTPATIENT_CLINIC_OR_DEPARTMENT_OTHER): Payer: Self-pay | Admitting: *Deleted

## 2016-02-06 ENCOUNTER — Inpatient Hospital Stay (HOSPITAL_BASED_OUTPATIENT_CLINIC_OR_DEPARTMENT_OTHER)
Admission: EM | Admit: 2016-02-06 | Discharge: 2016-02-10 | DRG: 372 | Disposition: A | Discharge status: Home / Self Care | Payer: 59 | Attending: Family Medicine | Admitting: Family Medicine

## 2016-02-06 DIAGNOSIS — K625 Hemorrhage of anus and rectum: Secondary | ICD-10-CM | POA: Diagnosis not present

## 2016-02-06 DIAGNOSIS — Z6835 Body mass index (BMI) 35.0-35.9, adult: Secondary | ICD-10-CM

## 2016-02-06 DIAGNOSIS — A020 Salmonella enteritis: Secondary | ICD-10-CM | POA: Diagnosis present

## 2016-02-06 DIAGNOSIS — N179 Acute kidney failure, unspecified: Secondary | ICD-10-CM | POA: Diagnosis not present

## 2016-02-06 DIAGNOSIS — G4730 Sleep apnea, unspecified: Secondary | ICD-10-CM | POA: Diagnosis present

## 2016-02-06 DIAGNOSIS — K529 Noninfective gastroenteritis and colitis, unspecified: Secondary | ICD-10-CM | POA: Diagnosis present

## 2016-02-06 DIAGNOSIS — G4733 Obstructive sleep apnea (adult) (pediatric): Secondary | ICD-10-CM | POA: Diagnosis present

## 2016-02-06 DIAGNOSIS — R197 Diarrhea, unspecified: Secondary | ICD-10-CM | POA: Diagnosis not present

## 2016-02-06 DIAGNOSIS — I10 Essential (primary) hypertension: Secondary | ICD-10-CM | POA: Diagnosis present

## 2016-02-06 DIAGNOSIS — E118 Type 2 diabetes mellitus with unspecified complications: Secondary | ICD-10-CM

## 2016-02-06 DIAGNOSIS — J4540 Moderate persistent asthma, uncomplicated: Secondary | ICD-10-CM | POA: Diagnosis present

## 2016-02-06 DIAGNOSIS — E860 Dehydration: Secondary | ICD-10-CM | POA: Diagnosis not present

## 2016-02-06 DIAGNOSIS — D649 Anemia, unspecified: Secondary | ICD-10-CM | POA: Diagnosis present

## 2016-02-06 DIAGNOSIS — E876 Hypokalemia: Secondary | ICD-10-CM | POA: Diagnosis present

## 2016-02-06 DIAGNOSIS — A09 Infectious gastroenteritis and colitis, unspecified: Secondary | ICD-10-CM | POA: Diagnosis not present

## 2016-02-06 DIAGNOSIS — N170 Acute kidney failure with tubular necrosis: Secondary | ICD-10-CM | POA: Diagnosis not present

## 2016-02-06 DIAGNOSIS — E119 Type 2 diabetes mellitus without complications: Secondary | ICD-10-CM | POA: Diagnosis present

## 2016-02-06 DIAGNOSIS — R195 Other fecal abnormalities: Secondary | ICD-10-CM | POA: Diagnosis present

## 2016-02-06 DIAGNOSIS — B009 Herpesviral infection, unspecified: Secondary | ICD-10-CM | POA: Diagnosis present

## 2016-02-06 DIAGNOSIS — E785 Hyperlipidemia, unspecified: Secondary | ICD-10-CM | POA: Diagnosis present

## 2016-02-06 DIAGNOSIS — R531 Weakness: Secondary | ICD-10-CM | POA: Diagnosis not present

## 2016-02-06 DIAGNOSIS — R112 Nausea with vomiting, unspecified: Secondary | ICD-10-CM | POA: Diagnosis not present

## 2016-02-06 DIAGNOSIS — R634 Abnormal weight loss: Secondary | ICD-10-CM | POA: Diagnosis not present

## 2016-02-06 DIAGNOSIS — J309 Allergic rhinitis, unspecified: Secondary | ICD-10-CM | POA: Diagnosis present

## 2016-02-06 DIAGNOSIS — A049 Bacterial intestinal infection, unspecified: Secondary | ICD-10-CM | POA: Diagnosis not present

## 2016-02-06 DIAGNOSIS — E66813 Obesity, class 3: Secondary | ICD-10-CM | POA: Diagnosis present

## 2016-02-06 DIAGNOSIS — K921 Melena: Secondary | ICD-10-CM | POA: Diagnosis not present

## 2016-02-06 LAB — CBC
HCT: 37.1 % (ref 36.0–46.0)
Hemoglobin: 12.6 g/dL (ref 12.0–15.0)
MCH: 26.4 pg (ref 26.0–34.0)
MCHC: 34 g/dL (ref 30.0–36.0)
MCV: 77.8 fL — AB (ref 78.0–100.0)
PLATELETS: 338 10*3/uL (ref 150–400)
RBC: 4.77 MIL/uL (ref 3.87–5.11)
RDW: 13.5 % (ref 11.5–15.5)
WBC: 3.9 10*3/uL — ABNORMAL LOW (ref 4.0–10.5)

## 2016-02-06 LAB — CREATININE, SERUM
Creatinine, Ser: 1.17 mg/dL — ABNORMAL HIGH (ref 0.44–1.00)
GFR calc non Af Amer: 57 mL/min — ABNORMAL LOW (ref >60)

## 2016-02-06 LAB — PHOSPHORUS: Phosphorus: 2.1 mg/dL — ABNORMAL LOW (ref 2.5–4.6)

## 2016-02-06 LAB — COMPREHENSIVE METABOLIC PANEL
ALT: 22 U/L (ref 14–54)
ANION GAP: 13 (ref 5–15)
AST: 26 U/L (ref 15–41)
Albumin: 4 g/dL (ref 3.5–5.0)
Alkaline Phosphatase: 103 U/L (ref 38–126)
BUN: 23 mg/dL — ABNORMAL HIGH (ref 6–20)
CHLORIDE: 103 mmol/L (ref 101–111)
CO2: 19 mmol/L — ABNORMAL LOW (ref 22–32)
CREATININE: 1.71 mg/dL — AB (ref 0.44–1.00)
Calcium: 9.2 mg/dL (ref 8.9–10.3)
GFR, EST AFRICAN AMERICAN: 42 mL/min — AB (ref >60)
GFR, EST NON AFRICAN AMERICAN: 36 mL/min — AB (ref >60)
Glucose, Bld: 141 mg/dL — ABNORMAL HIGH (ref 65–99)
POTASSIUM: 2.7 mmol/L — AB (ref 3.5–5.1)
Sodium: 135 mmol/L (ref 135–145)
Total Bilirubin: 0.4 mg/dL (ref 0.3–1.2)
Total Protein: 8.8 g/dL — ABNORMAL HIGH (ref 6.5–8.1)

## 2016-02-06 LAB — CBC WITH DIFFERENTIAL/PLATELET
BAND NEUTROPHILS: 3 %
BASOS PCT: 0 %
Basophils Absolute: 0 10*3/uL (ref 0.0–0.1)
Blasts: 0 %
EOS PCT: 1 %
Eosinophils Absolute: 0 10*3/uL (ref 0.0–0.7)
HCT: 39.2 % (ref 36.0–46.0)
Hemoglobin: 13.6 g/dL (ref 12.0–15.0)
LYMPHS ABS: 2 10*3/uL (ref 0.7–4.0)
LYMPHS PCT: 42 %
MCH: 26.6 pg (ref 26.0–34.0)
MCHC: 34.7 g/dL (ref 30.0–36.0)
MCV: 76.7 fL — AB (ref 78.0–100.0)
MONO ABS: 0.4 10*3/uL (ref 0.1–1.0)
Metamyelocytes Relative: 0 %
Monocytes Relative: 8 %
Myelocytes: 1 %
NEUTROS ABS: 2.4 10*3/uL (ref 1.7–7.7)
NEUTROS PCT: 45 %
NRBC: 0 /100{WBCs}
OTHER: 0 %
PLATELETS: 354 10*3/uL (ref 150–400)
Promyelocytes Absolute: 0 %
RBC: 5.11 MIL/uL (ref 3.87–5.11)
RDW: 13.1 % (ref 11.5–15.5)
WBC: 4.8 10*3/uL (ref 4.0–10.5)

## 2016-02-06 LAB — C DIFFICILE QUICK SCREEN W PCR REFLEX
C DIFFICILE (CDIFF) INTERP: NOT DETECTED
C DIFFICILE (CDIFF) TOXIN: NEGATIVE
C DIFFICLE (CDIFF) ANTIGEN: NEGATIVE

## 2016-02-06 LAB — MAGNESIUM: MAGNESIUM: 2.1 mg/dL (ref 1.7–2.4)

## 2016-02-06 LAB — OCCULT BLOOD X 1 CARD TO LAB, STOOL: Fecal Occult Bld: POSITIVE — AB

## 2016-02-06 LAB — GLUCOSE, CAPILLARY: GLUCOSE-CAPILLARY: 128 mg/dL — AB (ref 65–99)

## 2016-02-06 MED ORDER — ONDANSETRON HCL 4 MG/2ML IJ SOLN
4.0000 mg | Freq: Three times a day (TID) | INTRAMUSCULAR | Status: AC | PRN
  Administered 2016-02-06 – 2016-02-09 (×2): 4 mg via INTRAVENOUS
  Filled 2016-02-06 (×2): qty 2

## 2016-02-06 MED ORDER — LIP MEDEX EX OINT
TOPICAL_OINTMENT | CUTANEOUS | Status: AC
  Administered 2016-02-06: 1
  Filled 2016-02-06: qty 7

## 2016-02-06 MED ORDER — SODIUM CHLORIDE 0.9 % IV BOLUS (SEPSIS)
1000.0000 mL | Freq: Once | INTRAVENOUS | Status: AC
  Administered 2016-02-06: 1000 mL via INTRAVENOUS

## 2016-02-06 MED ORDER — SODIUM CHLORIDE 0.9 % IV SOLN: INTRAVENOUS | Status: DC

## 2016-02-06 MED ORDER — ACETAMINOPHEN 325 MG PO TABS: 650.0000 mg | ORAL_TABLET | Freq: Four times a day (QID) | ORAL | Status: DC | PRN

## 2016-02-06 MED ORDER — POTASSIUM CHLORIDE IN NACL 40-0.9 MEQ/L-% IV SOLN
INTRAVENOUS | Status: DC
  Administered 2016-02-06 – 2016-02-08 (×4): 150 mL/h via INTRAVENOUS
  Filled 2016-02-06 (×6): qty 1000

## 2016-02-06 MED ORDER — HYDRALAZINE HCL 20 MG/ML IJ SOLN: 10.0000 mg | INTRAMUSCULAR | Status: DC | PRN

## 2016-02-06 MED ORDER — MOMETASONE FURO-FORMOTEROL FUM 200-5 MCG/ACT IN AERO
2.0000 | INHALATION_SPRAY | Freq: Two times a day (BID) | RESPIRATORY_TRACT | Status: DC
  Filled 2016-02-06: qty 8.8

## 2016-02-06 MED ORDER — MAGNESIUM SULFATE 2 GM/50ML IV SOLN: 2.0000 g | Freq: Once | INTRAVENOUS | Status: AC

## 2016-02-06 MED ORDER — MAGNESIUM SULFATE 2 GM/50ML IV SOLN
2.0000 g | Freq: Once | INTRAVENOUS | Status: AC
  Administered 2016-02-06: 2 g via INTRAVENOUS
  Filled 2016-02-06: qty 50

## 2016-02-06 MED ORDER — PNEUMOCOCCAL VAC POLYVALENT 25 MCG/0.5ML IJ INJ
0.5000 mL | INJECTION | INTRAMUSCULAR | Status: DC
  Filled 2016-02-06: qty 0.5

## 2016-02-06 MED ORDER — POTASSIUM CHLORIDE 10 MEQ/100ML IV SOLN
10.0000 meq | INTRAVENOUS | Status: AC
  Administered 2016-02-06 (×3): 10 meq via INTRAVENOUS
  Filled 2016-02-06 (×3): qty 100

## 2016-02-06 MED ORDER — CIPROFLOXACIN IN D5W 400 MG/200ML IV SOLN
400.0000 mg | Freq: Two times a day (BID) | INTRAVENOUS | Status: DC
  Administered 2016-02-06 – 2016-02-10 (×9): 400 mg via INTRAVENOUS
  Filled 2016-02-06 (×8): qty 200

## 2016-02-06 MED ORDER — ACETAMINOPHEN 650 MG RE SUPP: 650.0000 mg | Freq: Four times a day (QID) | RECTAL | Status: DC | PRN

## 2016-02-06 MED ORDER — ONDANSETRON HCL 4 MG/2ML IJ SOLN: 4.0000 mg | Freq: Three times a day (TID) | INTRAMUSCULAR | Status: DC | PRN

## 2016-02-06 MED ORDER — HEPARIN SODIUM (PORCINE) 5000 UNIT/ML IJ SOLN: 5000.0000 [IU] | Freq: Three times a day (TID) | INTRAMUSCULAR | Status: DC

## 2016-02-06 MED ORDER — ONDANSETRON HCL 4 MG PO TABS: 4.0000 mg | ORAL_TABLET | Freq: Four times a day (QID) | ORAL | Status: DC | PRN

## 2016-02-06 MED ORDER — SODIUM CHLORIDE 0.9% FLUSH
3.0000 mL | Freq: Two times a day (BID) | INTRAVENOUS | Status: DC
  Administered 2016-02-07 – 2016-02-09 (×3): 3 mL via INTRAVENOUS

## 2016-02-06 MED ORDER — INSULIN ASPART 100 UNIT/ML ~~LOC~~ SOLN
0.0000 [IU] | Freq: Three times a day (TID) | SUBCUTANEOUS | Status: DC
  Administered 2016-02-07: 2 [IU] via SUBCUTANEOUS
  Administered 2016-02-08: 1 [IU] via SUBCUTANEOUS

## 2016-02-06 MED ORDER — POTASSIUM CHLORIDE CRYS ER 20 MEQ PO TBCR
40.0000 meq | EXTENDED_RELEASE_TABLET | Freq: Once | ORAL | Status: AC
  Administered 2016-02-06: 40 meq via ORAL
  Filled 2016-02-06: qty 2

## 2016-02-06 MED ORDER — ZOLPIDEM TARTRATE 5 MG PO TABS: 5.0000 mg | ORAL_TABLET | Freq: Every evening | ORAL | Status: DC | PRN

## 2016-02-06 MED ORDER — ALBUTEROL SULFATE (2.5 MG/3ML) 0.083% IN NEBU: 3.0000 mL | INHALATION_SOLUTION | Freq: Four times a day (QID) | RESPIRATORY_TRACT | Status: DC | PRN

## 2016-02-06 MED ORDER — INSULIN ASPART 100 UNIT/ML ~~LOC~~ SOLN
2.0000 [IU] | Freq: Three times a day (TID) | SUBCUTANEOUS | Status: DC
  Administered 2016-02-07 – 2016-02-08 (×4): 2 [IU] via SUBCUTANEOUS

## 2016-02-06 NOTE — H&P (Signed)
History and Physical  Tina Flores [id number] DOB: January 22, 1976 DOA: 02/06/2016  Referring physician: Wonda Olds.  PCP: Wenda Low, MD   Chief Complaint: diarrhea  HPI: Tina Flores is a 40 y.o. 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 Symptoms started about 3 hours after eating some left over [place] food.  She says that she was the only one in the house that ate that leftover [place] food and she has been the only one sick.   She has had 3 ED visits in last week and supportive care has not stopped the diarrhea.  She has bowel movements approximately every 2 hours. Patient had associated fever at onset however has not had a fever in 2 days. She has had nausea. She had vomiting but this has been controlled with Zofran given her previous visit. She continues to drink fluids. She is currently taking Imodium and potassium supplementation. No known sick contacts. Only travel was to the [place] in October. The onset of this condition was acute. The course is constant. Aggravating factors: none. Alleviating factors: none.   In ED: She was evaluated and noted to be severely hypokalemic in addition to having hemoccult positive stools and clinically dehydrated with acute renal insufficiency. She is being admitted for further evaluation and management.  Review of Systems:  Constitutional: Positive for fatigue. Negative for fever.  HENT: Negative for rhinorrhea and sore throat.   Eyes: Negative for redness.  Respiratory: Negative for cough.   Cardiovascular: Negative for chest pain.  Gastrointestinal: Positive for blood in stool, diarrhea and nausea. Negative for abdominal pain and vomiting.  Genitourinary: Negative for dysuria.    Musculoskeletal: Negative for myalgias.  Skin: Negative for rash.  Neurological: Positive for light-headedness. Negative for headaches.  All systems reviewed and apart from history of presenting illness, are negative.  Past Medical History:  Diagnosis Date  . Abnormal Pap smear 04/2010  . Allergic rhinitis   . Asthma    inhaler use 3-4 x per day since pregnant  . Diabetes mellitus   . H/O candidiasis   . H/O varicella   . HSV-1 infection 2010  . HSV-2 infection 2010  . HTN (hypertension)   . Hyperlipidemia   . Obesity, Class III, BMI 40-49.9 (morbid obesity) ([place])   . Shortness of breath   . Sleep apnea    CPAP machine   Past Surgical History:  Procedure Laterality Date  . CESAREAN SECTION    . OVARIAN CYST REMOVAL  2003   left  . REFRACTIVE SURGERY    . TONSILLECTOMY     age 44  . WISDOM TOOTH EXTRACTION  2009   Social History:  reports that she has never smoked. She has never used smokeless tobacco. She reports that she does not drink alcohol or use drugs.   No Known Allergies  Family History  Problem Relation Age of Onset  . Asthma Mother   . Hyperlipidemia Mother   . Hypertension Mother   . Asthma Father   . Diabetes Father   . Hypertension Father   . Asthma Sister   . Mental illness Sister   . Heart attack Paternal Grandmother     Prior to Admission medications   Medication Sig Start Date End Date Taking? Authorizing Provider  albuterol (PROVENTIL HFA;VENTOLIN HFA) 108 (90 Base) MCG/ACT inhaler Inhale 1-2 puffs into the lungs every 6 (six) hours as needed for wheezing or shortness of breath.   Yes Historical Provider, MD  amLODipine-benazepril (LOTREL) 5-20 MG capsule Take 1 capsule by mouth daily.   Yes Historical Provider, MD  budesonide-formoterol (SYMBICORT) 160-4.5 MCG/ACT inhaler Inhale 2 puffs into the lungs 2 (two) times daily.    Yes Historical Provider, MD  loperamide (IMODIUM) 1 MG/5ML solution Take 6 mg by mouth as needed for diarrhea or loose  stools.   Yes Historical Provider, MD  ondansetron (ZOFRAN) 4 MG tablet Take 4 mg by mouth every 6 (six) hours as needed for nausea or vomiting.   Yes Historical Provider, MD  potassium chloride SA (K-DUR,KLOR-CON) 20 MEQ tablet Take 20 mEq by mouth daily.   Yes Historical Provider, MD  spironolactone (ALDACTONE) 50 MG tablet Take 50 mg by mouth daily.   Yes Historical Provider, MD  valACYclovir (VALTREX) 500 MG tablet Take 500 mg by mouth daily as needed (for outbreaks).    Yes Historical Provider, MD   Physical Exam: Vitals:   02/06/16 1400 02/06/16 1620 02/06/16 1637 02/06/16 1722  BP: 111/79 113/78 116/78 126/78  Pulse: 65 68 66 69  Resp: 16 16 18 16   Temp: 97.4 F (36.3 C)  98.2 F (36.8 C) 98.1 F (36.7 C)  TempSrc: Oral  Oral Axillary  SpO2: 100% 99% 100% 100%  Weight:      Height:         General exam: Moderately built and nourished patient, lying comfortably supine on the gurney in no obvious distress.  Head, eyes and ENT: Nontraumatic and normocephalic. Pupils equally reacting to light and accommodation. Oral mucosa dry.  Neck: Supple. No JVD, carotid bruit or thyromegaly.  Lymphatics: No lymphadenopathy.  Respiratory system: Clear to auscultation. No increased work of breathing.  Cardiovascular system: S1 and S2 heard, RRR. No JVD, murmurs, gallops, clicks or pedal edema.  Gastrointestinal system: Abdomen is nondistended, soft and mild nonspecific tenderness no guarding. Normal bowel sounds heard. No organomegaly or masses appreciated.  Central nervous system: Alert and oriented. No focal neurological deficits.  Extremities: Symmetric 5 x 5 power. Peripheral pulses symmetrically felt.   Skin: No rashes or acute findings.  Musculoskeletal system: Negative exam.  Psychiatry: Pleasant and cooperative.  Labs on Admission:  Basic Metabolic Panel:  Recent Labs Lab 02/01/16 1633 02/04/16 1149 02/06/16 1005  NA 133* 134* 135  K 3.3* 2.9* 2.7*  CL 107 105  103  CO2 18* 17* 19*  GLUCOSE 197* 166* 141*  BUN 8 24* 23*  CREATININE 0.86 1.51* 1.71*  CALCIUM 8.8* 9.1 9.2   Liver Function Tests:  Recent Labs Lab 02/01/16 1633 02/04/16 1149 02/06/16 1005  AST 25 25 26   ALT 30 24 22   ALKPHOS 124 115 103  BILITOT 0.5 0.4 0.4  PROT 7.7 8.9* 8.8*  ALBUMIN 4.0 4.4 4.0    Recent Labs Lab 02/04/16 1149  LIPASE 33   No results for input(s): AMMONIA in the last 168 hours. CBC:  Recent Labs Lab 02/01/16 1633 02/04/16 1149 02/06/16 1005  WBC 4.3 5.1 4.8  NEUTROABS 3.4 3.5 2.4  HGB 11.6* 13.4 13.6  HCT 35.1* 40.0 39.2  MCV 81.8 78.9 76.7*  PLT 250 301 354   Cardiac Enzymes: No results for input(s): CKTOTAL, CKMB, CKMBINDEX, TROPONINI in the last 168 hours.  BNP (last 3 results) No results for input(s): PROBNP in the last 8760 hours.  CBG: No results for input(s): GLUCAP in the last 168 hours.  Radiological Exams on Admission: No results found.  EKG: Independently reviewed.  Assessment/Plan Active Problems:   Hypokalemia   Acute gastroenteritis   Acute renal failure (HCC)   Dehydration   Diarrhea   HTN (hypertension)   HSV-2 infection   HSV-1 infection   Type 2 diabetes mellitus (HCC)   Asthma, moderate persistent   Allergic rhinitis   Hyperlipidemia   Sleep apnea   Obesity, Class III, BMI 40-49.9 (morbid obesity) (HCC)   BRBPR (bright red blood per rectum)   Hematest positive stools   Acute infective gastroenteritis  1. Acute Infectious Gastroenteritis - symptoms occurred after leftover [place] food consumption but has persisted for nearly 1 week, will hospitalize for further symptomatic and supportive care.  Start ciprofloxacin therapy.  Follow GI pathogen panel.   2. Moderate to severe dehydration - Intensive IVF hydration, check orthostatic vitals. Follow Intake/Output and weights.   3. Hypokalemia - severe, will replete in IVFs, also will give 2 gm Mag sulfate IV.  Recheck BMP, Mg in AM.  Monitor on telemetry.     4. Lower GI bleeding - Normal hemoglobin is reassuring given the amount of blood loss that she is reporting.  Heme test was positive, suspect this is related to acute infection, follow closely.  Consider GI consult if persists despite current treatment.  Hold heparins and use SCDs for DVT prophylaxis. 5. Severe Diarrhea - Had been taking lomotil at home, hold for now, treat symptomatically as above and re-assess, follow clinically.   6. Type 2 Diabetes Mellitus - sensitive sliding scale coverage ordered, carb modified diet.   7. OSA - nightly CPAP ordered.  8. Acute Renal Failure - Secondary to dehydration, hold nephrotoxic agents, Hydrate aggressively and recheck BMP in AM.   9. Essential Hypertension - IV hydralazine ordered, holding home Lotrel secondary to ARF.   10. Moderate Persistent Asthma - compensated as long as she takes her daily medications which will resume in hospital.     DVT Prophylaxis:  SCDs Code Status: FULL  Family Communication: bedside  Disposition Plan: home   Time spent: 61 mins  Irwin Brakeman, MD [facility] Pager [phone]  If 7PM-7AM, please contact night-coverage [email or web address] Password [facility] 02/06/2016, 5:47 PM

## 2016-02-06 NOTE — Plan of Care (Signed)
Called by [facility] from [facility]  Briefly 40 year old patient with 1 week of diarrhea, fever presenting with hypokalemia 2.7, acute kidney injury, rectal bleeding. Orthostatic  Accepted to tele, inpatient   Mackenna Kamer M.D. [facility] 02/06/2016, 1:46 PM  Pager: [phone]

## 2016-02-06 NOTE — ED Triage Notes (Signed)
Pt reports diarrhea and nausea, seen here Tuesday and dx with gi virus, states she is not feeling better today.

## 2016-02-06 NOTE — ED Provider Notes (Signed)
[place] DEPT [facility] Provider Note   CSN: [id number] Arrival date & time: 02/06/16  0907     History   Chief Complaint Chief Complaint  Patient presents with  . Diarrhea    HPI Tina Flores is a 40 y.o. female.  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 She has bowel movements approximately every 2 hours. Patient had associated fever at onset however has not had a fever in 2 days. She has had nausea. She had vomiting but this has been controlled with Zofran given her previous visit. She continues to drink fluids. She is currently taking Imodium and potassium supplementation. No known sick contacts. Only travel was to the [place] in October. The onset of this condition was acute. The course is constant. Aggravating factors: none. Alleviating factors: none.        Past Medical History:  Diagnosis Date  . Abnormal Pap smear 04/2010  . Allergic rhinitis   . Asthma    inhaler use 3-4 x per day since pregnant  . Diabetes mellitus   . H/O candidiasis   . H/O varicella   . HSV-1 infection 2010  . HSV-2 infection 2010  . HTN (hypertension)   . Hyperlipidemia   . Obesity, Class III, BMI 40-49.9 (morbid obesity) ([place])   . Shortness of breath   . Sleep apnea    CPAP machine    Patient Active Problem List   Diagnosis Date Noted  . Other and unspecified hyperlipidemia 01/13/2013  . Status post repeat low transverse cesarean section 10/21/2011  . Asthma 09/17/2011  . H/O cesarean section 08/24/2011  . Umbilical cord, single artery and vein 06/24/2011  . AMA (advanced maternal age) multigravida 35+ 06/05/2011  . BMI 50.0-59.9, adult ([place]) 06/05/2011  . Hypertension 06/05/2011  . Type II or unspecified type diabetes mellitus  without mention of complication, uncontrolled 06/05/2011    Past Surgical History:  Procedure Laterality Date  . CESAREAN SECTION    . OVARIAN CYST REMOVAL  2003   left  . REFRACTIVE SURGERY    . TONSILLECTOMY     age 4  . WISDOM TOOTH EXTRACTION  2009    OB History    Gravida Para Term Preterm AB Living   6 2 2   4 2    SAB TAB Ectopic Multiple Live Births   2       2       Home Medications    Prior to Admission medications   Medication Sig Start Date End Date Taking? Authorizing Provider  amLODipine-benazepril (LOTREL) 5-20 MG capsule Take 1 capsule by mouth daily.    Historical Provider, MD  budesonide-formoterol (SYMBICORT) 160-4.5 MCG/ACT inhaler Inhale 2 puffs into the lungs 2 (two) times daily as needed (SOB, wheezing).     Historical Provider, MD  ondansetron (ZOFRAN) 4 MG tablet Take 1 tablet (4 mg total) by mouth every 6 (six) hours. 02/04/16   Recardo Evangelist, PA-C  potassium chloride 20 MEQ TBCR Take 20 mEq by mouth daily. 02/04/16   Recardo Evangelist, PA-C  spironolactone (ALDACTONE) 50 MG tablet Take 50 mg by mouth daily.    Historical Provider, MD  valACYclovir (VALTREX) 500 MG tablet Take 500 mg by mouth daily as needed (outbreak).  01/08/16   Historical  Provider, MD  VENTOLIN HFA 108 (90 Base) MCG/ACT inhaler Inhale 2 puffs into the lungs every 6 (six) hours as needed for wheezing or shortness of breath.  01/14/16   Historical Provider, MD    Family History Family History  Problem Relation Age of Onset  . Asthma Mother   . Hyperlipidemia Mother   . Hypertension Mother   . Asthma Father   . Diabetes Father   . Hypertension Father   . Asthma Sister   . Mental illness Sister   . Heart attack Paternal Grandmother     Social History Social History  Substance Use Topics  . Smoking status: Never Smoker  . Smokeless tobacco: Never Used  . Alcohol use No     Allergies   Patient has no known allergies.   Review of Systems Review of Systems    Constitutional: Positive for fatigue. Negative for fever.  HENT: Negative for rhinorrhea and sore throat.   Eyes: Negative for redness.  Respiratory: Negative for cough.   Cardiovascular: Negative for chest pain.  Gastrointestinal: Positive for blood in stool, diarrhea and nausea. Negative for abdominal pain and vomiting.  Genitourinary: Negative for dysuria.  Musculoskeletal: Negative for myalgias.  Skin: Negative for rash.  Neurological: Positive for light-headedness. Negative for headaches.     Physical Exam Updated Vital Signs BP 92/71 (BP Location: Right Arm)   Pulse 111   Temp 98 F (36.7 C) (Oral)   Resp 18   Ht 5\' 7"  (1.702 m)   Wt 95.3 kg   LMP 01/15/2016   SpO2 97%   BMI 32.89 kg/m   Physical Exam  Constitutional: She appears well-developed and well-nourished.  HENT:  Head: Normocephalic and atraumatic.  Mouth/Throat: Oropharynx is clear and moist.  Eyes: Conjunctivae are normal. Right eye exhibits no discharge. Left eye exhibits no discharge.  Neck: Normal range of motion. Neck supple.  Cardiovascular: Normal rate, regular rhythm and normal heart sounds.  Exam reveals no gallop and no friction rub.   No murmur heard. Pulmonary/Chest: Effort normal and breath sounds normal. She has no wheezes. She has no rales.  Abdominal: Soft. She exhibits no mass. There is no tenderness. There is no guarding.  Neurological: She is alert.  Skin: Skin is warm and dry.  Psychiatric: She has a normal mood and affect.  Nursing note and vitals reviewed.    ED Treatments / Results  Labs (all labs ordered are listed, but only abnormal results are displayed) Labs Reviewed  CBC WITH DIFFERENTIAL/PLATELET - Abnormal; Notable for the following:       Result Value   MCV 76.7 (*)    All other components within normal limits  COMPREHENSIVE METABOLIC PANEL - Abnormal; Notable for the following:    Potassium 2.7 (*)    CO2 19 (*)    Glucose, Bld 141 (*)    BUN 23 (*)     Creatinine, Ser 1.71 (*)    Total Protein 8.8 (*)    GFR calc non Af Amer 36 (*)    GFR calc Af Amer 42 (*)    All other components within normal limits  OCCULT BLOOD X 1 CARD TO LAB, STOOL - Abnormal; Notable for the following:    Fecal Occult Bld POSITIVE (*)    All other components within normal limits  GASTROINTESTINAL PANEL BY PCR, STOOL (REPLACES STOOL CULTURE)  C DIFFICILE QUICK SCREEN W PCR REFLEX  PATHOLOGIST SMEAR REVIEW    Procedures Procedures (including critical care time)  Medications  Ordered in ED Medications  sodium chloride 0.9 % bolus 1,000 mL (1,000 mLs Intravenous New Bag/Given 02/06/16 1129)  potassium chloride 10 mEq in 100 mL IVPB (10 mEq Intravenous New Bag/Given 02/06/16 1128)  sodium chloride 0.9 % bolus 1,000 mL (0 mLs Intravenous Stopped 02/06/16 1129)  potassium chloride SA (K-DUR,KLOR-CON) CR tablet 40 mEq (40 mEq Oral Given 02/06/16 1128)     Initial Impression / Assessment and Plan / ED Course  I have reviewed the triage vital signs and the nursing notes.  Pertinent labs & imaging results that were available during my care of the patient were reviewed by me and considered in my medical decision making (see chart for details).  Clinical Course    Patient seen and examined. Work-up initiated. Fluids ordered.   Vital signs reviewed and are as follows: BP 92/71 (BP Location: Right Arm)   Pulse 111   Temp 98 F (36.7 C) (Oral)   Resp 18   Ht 5\' 7"  (1.702 m)   Wt 95.3 kg   LMP 01/15/2016   SpO2 97%   BMI 32.89 kg/m   11:56 AM D/w Dr. Tyrone Nine. Will admit for worsening hypokalemia, worsening AKI, dehydration. Stool studies pending. Pt, family updated on plan and agree.   Spoke with Dr. Tana Coast [facility]. Will admit to [facility].   Final Clinical Impressions(s) / ED Diagnoses   Final diagnoses:  Diarrhea, unspecified type  Acute kidney injury ([place])  Hypokalemia  Dehydration   Admit.  New Prescriptions New Prescriptions   No medications on file      Carlisle Cater, PA-C 02/06/16 [place], DO 02/07/16 1525

## 2016-02-06 NOTE — ED Notes (Signed)
Patient up to bathroom, no distress. Provided with water okd by PA.

## 2016-02-06 NOTE — ED Notes (Signed)
Went to updated patient about waiting for carelink but patient asleep at this time.

## 2016-02-06 NOTE — Progress Notes (Signed)
Pt. Refused cpap. 

## 2016-02-07 ENCOUNTER — Encounter (HOSPITAL_COMMUNITY): Payer: Self-pay | Admitting: Family Medicine

## 2016-02-07 DIAGNOSIS — A020 Salmonella enteritis: Secondary | ICD-10-CM | POA: Diagnosis present

## 2016-02-07 LAB — GLUCOSE, CAPILLARY
Glucose-Capillary: 158 mg/dL — ABNORMAL HIGH (ref 65–99)
Glucose-Capillary: 95 mg/dL (ref 65–99)
Glucose-Capillary: 105 mg/dL — ABNORMAL HIGH (ref 65–99)

## 2016-02-07 LAB — GASTROINTESTINAL PANEL BY PCR, STOOL (REPLACES STOOL CULTURE)
ADENOVIRUS F40/41: NOT DETECTED
ASTROVIRUS: NOT DETECTED
CAMPYLOBACTER SPECIES: NOT DETECTED
CRYPTOSPORIDIUM: NOT DETECTED
Cyclospora cayetanensis: NOT DETECTED
ENTEROPATHOGENIC E COLI (EPEC): NOT DETECTED
ENTEROTOXIGENIC E COLI (ETEC): NOT DETECTED
Entamoeba histolytica: NOT DETECTED
Enteroaggregative E coli (EAEC): NOT DETECTED
Giardia lamblia: NOT DETECTED
Norovirus GI/GII: NOT DETECTED
PLESIMONAS SHIGELLOIDES: NOT DETECTED
ROTAVIRUS A: NOT DETECTED
SAPOVIRUS (I, II, IV, AND V): NOT DETECTED
SHIGA LIKE TOXIN PRODUCING E COLI (STEC): NOT DETECTED
Salmonella species: DETECTED — AB
Shigella/Enteroinvasive E coli (EIEC): NOT DETECTED
Vibrio cholerae: NOT DETECTED
Vibrio species: NOT DETECTED
YERSINIA ENTEROCOLITICA: NOT DETECTED

## 2016-02-07 LAB — CBC
HEMATOCRIT: 36 % (ref 36.0–46.0)
HEMOGLOBIN: 11.9 g/dL — AB (ref 12.0–15.0)
MCH: 26.2 pg (ref 26.0–34.0)
MCHC: 33.1 g/dL (ref 30.0–36.0)
MCV: 79.1 fL (ref 78.0–100.0)
Platelets: 304 10*3/uL (ref 150–400)
RBC: 4.55 MIL/uL (ref 3.87–5.11)
RDW: 13.6 % (ref 11.5–15.5)
WBC: 4.2 10*3/uL (ref 4.0–10.5)

## 2016-02-07 LAB — BASIC METABOLIC PANEL
ANION GAP: 10 (ref 5–15)
BUN: 16 mg/dL (ref 6–20)
CALCIUM: 8.2 mg/dL — AB (ref 8.9–10.3)
CO2: 16 mmol/L — ABNORMAL LOW (ref 22–32)
Chloride: 112 mmol/L — ABNORMAL HIGH (ref 101–111)
Creatinine, Ser: 1.05 mg/dL — ABNORMAL HIGH (ref 0.44–1.00)
GFR calc Af Amer: >60 mL/min (ref >60)
GLUCOSE: 143 mg/dL — AB (ref 65–99)
POTASSIUM: 3.4 mmol/L — AB (ref 3.5–5.1)
SODIUM: 138 mmol/L (ref 135–145)

## 2016-02-07 LAB — HEMOGLOBIN A1C
HEMOGLOBIN A1C: 6.2 % — AB (ref 4.8–5.6)
MEAN PLASMA GLUCOSE: 131 mg/dL

## 2016-02-07 LAB — MAGNESIUM: MAGNESIUM: 2.5 mg/dL — AB (ref 1.7–2.4)

## 2016-02-07 MED ORDER — RANITIDINE HCL 150 MG/10ML PO SYRP
300.0000 mg | ORAL_SOLUTION | Freq: Every day | ORAL | Status: DC
  Administered 2016-02-07: 300 mg via ORAL
  Filled 2016-02-07 (×4): qty 20

## 2016-02-07 MED ORDER — FAMOTIDINE 40 MG/5ML PO SUSR: 40.0000 mg | Freq: Every day | ORAL | Status: DC

## 2016-02-07 MED ORDER — CALCIUM CARBONATE ANTACID 500 MG PO CHEW
1.0000 | CHEWABLE_TABLET | Freq: Two times a day (BID) | ORAL | Status: DC | PRN
  Administered 2016-02-07 – 2016-02-08 (×3): 200 mg via ORAL
  Filled 2016-02-07 (×4): qty 1

## 2016-02-07 NOTE — Progress Notes (Signed)
PROGRESS NOTE    Tina Flores  [id number]  DOB: April 23, 1975  DOA: 02/06/2016 PCP: Wenda Low, MD  Hospital course: Tina Flores is a 40 y.o. 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 Symptoms started about 3 hours after eating some left over [place] food.  She says that she was the only one in the house that ate that leftover [place] food and she has been the only one sick.   She has had 3 ED visits in last week and supportive care has not stopped the diarrhea.  She has bowel movements approximately every 2 hours. Patient had associated fever at onset however has not had a fever in 2 days. She has had nausea. She had vomiting but this has been controlled with Zofran given her previous visit. She continues to drink fluids. She is currently taking Imodium and potassium supplementation. No known sick contacts. Only travel was to the [place] in October. The onset of this condition was acute. The course is constant. Aggravating factors: none. Alleviating factors: none.   Assessment & Plan:    1. Acute Infectious Gastroenteritis - symptoms occurred after leftover [place] food consumption but has persisted for nearly 1 week. Still having severe diarrhea after starting cipro, cannot stay hydrated without IV fluids. Not medically safe for discharge at this time. GI pathogen panel pending, added Ova and parasite test, consulted [facility] for management assistance.    2. Severe dehydration - Needs continued IVF hydration.  Cannot keep down fluids without severe dehydration.  High Risk for readmission if sent home now.    3. Hypokalemia - persists despite IV replacement, likely has total body depletion, continue to replete in IVFs, received  Magnesium IV.  Recheck BMP, Mg in AM.  Monitor on telemetry.   4. Lower GI bleeding - Suspect from infectious diarrhea, Hg stable, following.  Heme test was positive, suspect this is related to acute infection, follow closely.  GI consult requested.  Hold heparins and use SCDs for DVT prophylaxis. 5. Type 2 Diabetes Mellitus - sensitive sliding scale coverage ordered, carb modified diet.  BS have been stable.  6. OSA - nightly CPAP ordered but patient declined to use. 7. Acute Renal Failure - Secondary to dehydration, hold nephrotoxic agents, Hydrate. Improving with IVF hydration, needs ongoing IV hydration to prevent worsening of condition.   8. Essential Hypertension - IV hydralazine ordered, holding home Lotrel secondary to ARF.   9. Moderate Persistent Asthma - compensated as long as she takes her daily medications which will resume in hospital.     DVT Prophylaxis:  SCDs Code Status: FULL  Family Communication: bedside  Disposition Plan: Pt needs further IV fluid hydration, further work up for cause of infectious diarrhea and not medically safe for discharge as she is still having severe diarrhea not able to eat/drink without severe diarrhea, high risk for further kidney damage   Consultants:  GI  Subjective: Pt says she is still having severe diarrhea every 2 hours and reports that she has diarrhea with every bite of food or sip of water, including ice chips.  Still is able to see blood with BMs.   Objective: Vitals:   02/06/16 2048 02/07/16 0013 02/07/16 0425 02/07/16 0625  BP: 130/83 108/63 109/69   Pulse:  85 66   Resp: 18 18 18    Temp: 97.8 F (36.6 C) 98.6 F (37 C) 98.2 F (36.8 C)   TempSrc: Axillary Oral Oral   SpO2: 98% 98% 100%   Weight:    99.4 kg (219 lb 2.2 oz)  Height:        Intake/Output Summary (Last 24 hours) at 02/07/16 0947 Last data filed at 02/07/16 0437  Gross per 24 hour  Intake           3032.5 ml  Output                0 ml  Net            3032.5 ml   Filed Weights   02/06/16 0922 02/07/16 0625  Weight: 95.3 kg (210 lb) 99.4 kg (219 lb 2.2 oz)    Exam:   General exam: Moderately built and nourished patient, lying comfortably supine on the gurney in no obvious distress.  Head, eyes and ENT: Nontraumatic and normocephalic. Pupils equally reacting to light and accommodation. Oral mucosa dry.  Neck: Supple. No JVD, carotid bruit or thyromegaly.  Lymphatics: No lymphadenopathy.  Respiratory system: Clear to auscultation. No increased work of breathing.  Cardiovascular system: S1 and S2 heard, RRR. No JVD, murmurs, gallops, clicks or pedal edema.  Gastrointestinal system: Abdomen is nondistended, soft and mild nonspecific tenderness no guarding. Normal bowel sounds heard. No organomegaly or masses appreciated.  Central nervous system: Alert and oriented. No focal neurological deficits.  Extremities: Symmetric 5 x 5 power. Peripheral pulses symmetrically felt.   Skin: No rashes or acute findings.  Musculoskeletal system: Negative exam.  Psychiatry: Pleasant and cooperative.  Data Reviewed: Basic Metabolic Panel:  Recent Labs Lab 02/01/16 1633 02/04/16 1149 02/06/16 1005 02/06/16 1905 02/07/16 0534  NA 133* 134* 135  --  138  K 3.3* 2.9* 2.7*  --  3.4*  CL 107 105 103  --  112*  CO2 18* 17* 19*  --  16*  GLUCOSE 197* 166* 141*  --  143*  BUN 8 24* 23*  --  16  CREATININE 0.86 1.51* 1.71* 1.17* 1.05*  CALCIUM 8.8* 9.1 9.2  --  8.2*  MG  --   --   --  2.1 2.5*  PHOS  --   --   --  2.1*  --    Liver Function Tests:  Recent Labs Lab 02/01/16 1633 02/04/16 1149 02/06/16 1005  AST 25 25 26   ALT 30 24 22   ALKPHOS 124 115 103  BILITOT 0.5 0.4 0.4  PROT 7.7 8.9* 8.8*  ALBUMIN 4.0 4.4 4.0    Recent Labs Lab 02/04/16 1149  LIPASE 33   No results for input(s): AMMONIA in the last 168 hours. CBC:  Recent Labs Lab 02/01/16 1633 02/04/16 1149 02/06/16 1005 02/06/16 1905 02/07/16 0534  WBC  4.3 5.1 4.8 3.9* 4.2  NEUTROABS 3.4 3.5 2.4  --   --   HGB 11.6* 13.4 13.6 12.6 11.9*  HCT 35.1* 40.0 39.2 37.1 36.0  MCV 81.8 78.9 76.7* 77.8* 79.1  PLT 250 301 354 338 304   Cardiac Enzymes: No results for input(s): CKTOTAL, CKMB, CKMBINDEX, TROPONINI in the last 168 hours. CBG (last 3)   Recent Labs  02/06/16 2226 02/07/16 0747  GLUCAP 128* 158*   Recent Results (from the past 240 hour(s))  C difficile quick scan w PCR reflex     Status: None   Collection Time: 02/06/16 10:10 AM  Result Value Ref Range Status   C Diff antigen NEGATIVE NEGATIVE Final   C Diff toxin NEGATIVE NEGATIVE Final   C Diff interpretation No C. difficile detected.  Final    Comment: Performed at [facility]     Studies: No results found.   Scheduled Meds: . ciprofloxacin  400 mg Intravenous Q12H  . insulin aspart  0-9 Units Subcutaneous TID WC  . insulin aspart  2 Units Subcutaneous TID WC  . mometasone-formoterol  2 puff Inhalation BID  . pneumococcal 23 valent vaccine  0.5 mL Intramuscular Tomorrow-1000  . sodium chloride flush  3 mL Intravenous Q12H   Continuous Infusions: . 0.9 % NaCl with KCl 40 mEq / L 150 mL/hr (02/07/16 0437)    Active Problems:   Hypokalemia   Acute gastroenteritis   Acute renal failure (HCC)   Dehydration   Diarrhea   HTN (hypertension)   HSV-2 infection   HSV-1 infection   Type 2 diabetes mellitus (HCC)   Asthma, moderate persistent   Allergic rhinitis   Hyperlipidemia   Sleep apnea   Obesity, Class III, BMI 40-49.9 (morbid obesity) (HCC)   BRBPR (bright red blood per rectum)   Hematest positive stools   Acute infective gastroenteritis  Time spent:   Irwin Brakeman, MD, FAAFP [facility] Pager [phone]  If 7PM-7AM, please contact night-coverage [email or web address] Password [email or web address] 02/07/2016, 9:47 AM    LOS: 1 day

## 2016-02-07 NOTE — ED Notes (Signed)
Lab notifies this rn of positive stool pcr for salmonella, Marvis Moeller, RN on [facility] notified.

## 2016-02-07 NOTE — Consult Note (Signed)
   [facility] CM Inpatient Consult   02/07/2016  Tina Flores 1975/07/18 [id number]    Spoke with Glenard Haring on the phone on behalf of Link to [facility] Care Management program for [employer] employees/dependents with [facility] insurance. Explained Link to [employer]. [name] indicates that she wants to follow for DM management. Writer unable to provide Link to Wellness at present time as Probation officer is not at bedside. However, will request for packet to be mailed to her. Made [name] aware that she will receive a post hospital discharge call and also called for a Link to Wellness appointment to be scheduled. Confirmed best contact number as [phone]. [name] expresses appreciation of call.  Marthenia Rolling, MSN-Ed, RN,BSN [facility] Liaison [phone]

## 2016-02-07 NOTE — Consult Note (Signed)
[facility] Consultation Note  Referring Provider: Dr. Irwin Brakeman [facility]) Primary Care Physician:  Wenda Low, MD  Reason for Consultation:  Bloody diarrhea  HPI: Tina Flores is a 40 y.o. female whom we've been asked to see for bloody diarrhea.  Patient with history of obesity with duodenal switch surgery in 2016 in [place], after which she's lost over 100 lbs.  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  Was in static state of GI health until one week ago.  At that time, soon after consuming food at [employer], patient developed abdominal cramps, followed within hours by fevers, nausea, vomiting and, ultimately, bloody diarrhea.  Over the past few days, the fevers and vomiting have subsided and she has become progressively weak and dizzy and dehydrated-feeling, necessitating her admission.  No abdominal pain now.  She continues to have 10+ bloody diarrheal stools per day.  Has lost 18 lbs in the past one week.  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  No recent travel during timeframe of symptom onset.  No preceding recent antibiotics.  No prior endoscopy or colonoscopy.  No known family history of colon cancer, colon polyps, or inflammatory bowel disease.  Loperamide has modestly helped, but by no means relieved, her diarrheal symptoms.   Past Medical History:  Diagnosis Date  . Abnormal Pap smear 04/2010  . Allergic rhinitis   . Asthma    inhaler use 3-4 x per day since pregnant  . Diabetes mellitus   . H/O candidiasis   . H/O varicella   . HSV-1 infection 2010  . HSV-2 infection 2010  . HTN (hypertension)   . Hyperlipidemia   . Obesity, Class III, BMI 40-49.9 (morbid obesity) ([place])   . Shortness of breath   . Sleep apnea    CPAP machine    Past Surgical History:  Procedure Laterality Date  . CESAREAN SECTION    . OVARIAN CYST REMOVAL  2003   left  . REFRACTIVE SURGERY    . TONSILLECTOMY     age 75  .  [place] EXTRACTION  2009    Prior to Admission medications   Medication Sig Start Date End Date Taking? Authorizing Provider  albuterol (PROVENTIL HFA;VENTOLIN HFA) 108 (90 Base) MCG/ACT inhaler Inhale 1-2 puffs into the lungs every 6 (six) hours as needed for wheezing or shortness of breath.   Yes Historical Provider, MD  amLODipine-benazepril (LOTREL) 5-20 MG capsule Take 1 capsule by mouth daily.   Yes Historical Provider, MD  budesonide-formoterol (SYMBICORT) 160-4.5 MCG/ACT inhaler Inhale 2 puffs into the lungs 2 (two) times daily.    Yes Historical Provider, MD  loperamide (IMODIUM) 1 MG/5ML solution Take 6 mg by mouth as needed for diarrhea or loose stools.   Yes Historical Provider, MD  ondansetron (ZOFRAN) 4 MG tablet Take 4 mg by mouth every 6 (six) hours as needed for nausea or vomiting.   Yes Historical Provider, MD  potassium chloride SA (K-DUR,KLOR-CON) 20 MEQ tablet Take 20 mEq by mouth daily.   Yes Historical Provider, MD  spironolactone (ALDACTONE) 50 MG tablet Take 50 mg by mouth daily.   Yes Historical Provider, MD  valACYclovir (VALTREX) 500 MG tablet Take 500 mg by mouth daily as needed (for outbreaks).    Yes Historical Provider, MD    Current Facility-Administered Medications  Medication Dose Route Frequency Provider Last Rate Last Dose  . 0.9 % NaCl with KCl 40 mEq / L  infusion  Intravenous Continuous Clanford Marisa Hua, MD 150 mL/hr at 02/07/16 1237 150 mL/hr at 02/07/16 1237  . acetaminophen (TYLENOL) tablet 650 mg  650 mg Oral Q6H PRN Clanford Marisa Hua, MD       Or  . acetaminophen (TYLENOL) suppository 650 mg  650 mg Rectal Q6H PRN Clanford L Johnson, MD      . albuterol (PROVENTIL) (2.5 MG/3ML) 0.083% nebulizer solution 3 mL  3 mL Inhalation Q6H PRN Clanford Marisa Hua, MD      . calcium carbonate (TUMS - dosed in mg elemental calcium) chewable tablet 200 mg of elemental calcium  1 tablet Oral BID PRN Clanford Marisa Hua, MD   200 mg of elemental calcium at  02/07/16 1218  . ciprofloxacin (CIPRO) IVPB 400 mg  400 mg Intravenous Q12H Clanford Marisa Hua, MD   400 mg at 02/07/16 [id number]  . hydrALAZINE (APRESOLINE) injection 10 mg  10 mg Intravenous Q4H PRN Clanford L Johnson, MD      . insulin aspart (novoLOG) injection 0-9 Units  0-9 Units Subcutaneous TID WC Clanford Marisa Hua, MD   2 Units at 02/07/16 0818  . insulin aspart (novoLOG) injection 2 Units  2 Units Subcutaneous TID WC Clanford Marisa Hua, MD   2 Units at 02/07/16 1239  . mometasone-formoterol (DULERA) 200-5 MCG/ACT inhaler 2 puff  2 puff Inhalation BID Clanford L Johnson, MD      . ondansetron (ZOFRAN) injection 4 mg  4 mg Intravenous Q8H PRN Clanford Marisa Hua, MD   4 mg at 02/06/16 2334  . ondansetron (ZOFRAN) tablet 4 mg  4 mg Oral Q6H PRN Clanford Marisa Hua, MD      . pneumococcal 23 valent vaccine (PNU-IMMUNE) injection 0.5 mL  0.5 mL Intramuscular Tomorrow-1000 Asencion Partridge, MD      . ranitidine (ZANTAC) 150 MG/10ML syrup 300 mg  300 mg Oral Daily Clanford L Johnson, MD      . sodium chloride flush (NS) 0.9 % injection 3 mL  3 mL Intravenous Q12H Clanford L Johnson, MD      . zolpidem (AMBIEN) tablet 5 mg  5 mg Oral QHS PRN Clanford Marisa Hua, MD        Allergies as of 02/06/2016  . (No Known Allergies)    Family History  Problem Relation Age of Onset  . Asthma Mother   . Hyperlipidemia Mother   . Hypertension Mother   . Asthma Father   . Diabetes Father   . Hypertension Father   . Asthma Sister   . Mental illness Sister   . Heart attack Paternal Grandmother     Social History   Social History  . Marital status: Married    Spouse name: N/A  . Number of children: 2  . Years of education: N/A   Occupational History  . -Licensed therapist [employer]   Social History Main Topics  . Smoking status: Never Smoker  . Smokeless tobacco: Never Used  . Alcohol use No  . Drug use: No  . Sexual activity: Not on file     Comment: BTL   Other Topics  Concern  . Not on file   Social History Narrative  . No narrative on file    Review of Systems: Positive = bold Gen: Denies any fever, chills, rigors, night sweats, anorexia, fatigue, weakness, malaise, involuntary weight loss, and sleep disorder CV: Denies chest pain, angina, palpitations, syncope, orthopnea, PND, peripheral edema, and claudication. Resp: Denies dyspnea, cough, sputum, wheezing, coughing  up blood. GI: Described in detail in HPI.    GU : Denies urinary burning, blood in urine, urinary frequency, urinary hesitancy, nocturnal urination, and urinary incontinence. MS: Denies joint pain or swelling.  Denies muscle weakness, cramps, atrophy.  Derm: Denies rash, itching, oral ulcerations, hives, unhealing ulcers.  Psych: Denies depression, anxiety, memory loss, suicidal ideation, hallucinations,  and confusion. Heme: Denies bruising, bleeding, and enlarged lymph nodes. Neuro:  Denies any headaches, dizziness, paresthesias. Endo:  Denies any problems with DM, thyroid, adrenal function.  Physical Exam: Vital signs in last 24 hours: Temp:  [97.4 F (36.3 C)-98.6 F (37 C)] 98.2 F (36.8 C) (12/08 0425) Pulse Rate:  [65-85] 66 (12/08 0425) Resp:  [16-18] 18 (12/08 0425) BP: (108-130)/(63-83) 109/69 (12/08 0425) SpO2:  [98 %-100 %] 100 % (12/08 0425) Weight:  [99.4 kg (219 lb 2.2 oz)] 99.4 kg (219 lb 2.2 oz) (12/08 0625) Last BM Date: 02/07/16 General:   Alert, obese, Well-developed, well-nourished, pleasant and cooperative in NAD Head:  Normocephalic and atraumatic. Eyes:  Sclera clear, no icterus.   Conjunctiva pink. Ears:  Normal auditory acuity. Nose:  No deformity, discharge,  or lesions. Mouth:  No deformity or lesions.  Oropharynx pink but dry Neck:  Supple; no masses or thyromegaly. Lungs:  Clear throughout to auscultation.   No wheezes, crackles, or rhonchi. No acute distress. Heart:  Regular rate and rhythm; no murmurs, clicks, rubs,  or gallops. Abdomen:   Soft, protuberant, nontender and nondistended. No masses, hepatosplenomegaly or hernias noted. Normal bowel sounds, without guarding, and without rebound.     Msk:  Symmetrical without gross deformities. Normal posture. Pulses:  Normal pulses noted. Extremities:  Without clubbing or edema. Neurologic:  Alert and  oriented x4;  grossly normal neurologically. Skin:  Intact without significant lesions or rashes. Psych:  Alert and cooperative. Normal mood and affect.   Lab Results:  Recent Labs  02/06/16 1005 02/06/16 1905 02/07/16 0534  WBC 4.8 3.9* 4.2  HGB 13.6 12.6 11.9*  HCT 39.2 37.1 36.0  PLT 354 338 304   BMET  Recent Labs  02/06/16 1005 02/06/16 1905 02/07/16 0534  NA 135  --  138  K 2.7*  --  3.4*  CL 103  --  112*  CO2 19*  --  16*  GLUCOSE 141*  --  143*  BUN 23*  --  16  CREATININE 1.71* 1.17* 1.05*  CALCIUM 9.2  --  8.2*   LFT  Recent Labs  02/06/16 1005  PROT 8.8*  ALBUMIN 4.0  AST 26  ALT 22  ALKPHOS 103  BILITOT 0.4   PT/INR No results for input(s): LABPROT, INR in the last 72 hours.  Studies/Results: No results found.  C. Diff PCR negative. GI pathogen panel pending. Stool o/p pending.  Impression:  1.  Blood in stool. 2.  Diarrhea. 3.  Weight loss. 4.  Nausea and vomiting. 5.  Weakness and dehydration. 6.  Anemia, very mild. 7.  Obesity with duodenal switch surgery in 2016 in [place]. 8.  Overall constellation of symptoms necessitating this admission most consistent with food-borne and/or infectious colitis.  Plan:  1.  Clear liquid diet. 2.  Antiemetics. 3.  Volume repletion with IV fluids. 4.  Follow CBC, electrolytes, renal function. 5.  On empiric antibiotics. 6.  Awaiting stool studies. 7.  If symptoms persist and stool studies are unrevealing, consider flexible sigmoidoscopy for further evaluation. 8.  [facility] will follow.   LOS: 1 day   Yasiel Goyne  M  02/07/2016, 1:42 PM  Pager [phone] If no answer  or after 5 PM call [phone]

## 2016-02-07 NOTE — Progress Notes (Signed)
Patient continues to refuse CPAP 

## 2016-02-08 DIAGNOSIS — K529 Noninfective gastroenteritis and colitis, unspecified: Secondary | ICD-10-CM

## 2016-02-08 DIAGNOSIS — A020 Salmonella enteritis: Principal | ICD-10-CM

## 2016-02-08 LAB — CBC
HEMATOCRIT: 33.9 % — AB (ref 36.0–46.0)
HEMOGLOBIN: 11.1 g/dL — AB (ref 12.0–15.0)
MCH: 25.9 pg — ABNORMAL LOW (ref 26.0–34.0)
MCHC: 32.7 g/dL (ref 30.0–36.0)
MCV: 79.2 fL (ref 78.0–100.0)
Platelets: 316 10*3/uL (ref 150–400)
RBC: 4.28 MIL/uL (ref 3.87–5.11)
RDW: 14 % (ref 11.5–15.5)
WBC: 4.9 10*3/uL (ref 4.0–10.5)

## 2016-02-08 LAB — BASIC METABOLIC PANEL
ANION GAP: 5 (ref 5–15)
BUN: 12 mg/dL (ref 6–20)
CHLORIDE: 116 mmol/L — AB (ref 101–111)
CO2: 20 mmol/L — ABNORMAL LOW (ref 22–32)
Calcium: 8.5 mg/dL — ABNORMAL LOW (ref 8.9–10.3)
Creatinine, Ser: 1.02 mg/dL — ABNORMAL HIGH (ref 0.44–1.00)
GFR calc Af Amer: >60 mL/min (ref >60)
GFR calc non Af Amer: >60 mL/min (ref >60)
GLUCOSE: 117 mg/dL — AB (ref 65–99)
POTASSIUM: 3.7 mmol/L (ref 3.5–5.1)
SODIUM: 141 mmol/L (ref 135–145)

## 2016-02-08 LAB — GLUCOSE, CAPILLARY
GLUCOSE-CAPILLARY: 128 mg/dL — AB (ref 65–99)
GLUCOSE-CAPILLARY: 96 mg/dL (ref 65–99)
GLUCOSE-CAPILLARY: 110 mg/dL — AB (ref 65–99)
Glucose-Capillary: 99 mg/dL (ref 65–99)
Glucose-Capillary: 98 mg/dL (ref 65–99)

## 2016-02-08 LAB — MAGNESIUM: Magnesium: 2.1 mg/dL (ref 1.7–2.4)

## 2016-02-08 LAB — VITAMIN D 25 HYDROXY (VIT D DEFICIENCY, FRACTURES): Vit D, 25-Hydroxy: 5.3 ng/mL — ABNORMAL LOW (ref 30.0–100.0)

## 2016-02-08 MED ORDER — AMLODIPINE BESYLATE 5 MG PO TABS: 5.0000 mg | ORAL_TABLET | Freq: Every day | ORAL | Status: DC

## 2016-02-08 MED ORDER — DIPHENOXYLATE-ATROPINE 2.5-0.025 MG PO TABS
1.0000 | ORAL_TABLET | Freq: Four times a day (QID) | ORAL | Status: DC | PRN
  Administered 2016-02-08: 1 via ORAL
  Filled 2016-02-08: qty 1

## 2016-02-08 MED ORDER — SODIUM CHLORIDE 0.9 % IV SOLN
INTRAVENOUS | Status: DC
  Administered 2016-02-08 – 2016-02-10 (×3): via INTRAVENOUS

## 2016-02-08 NOTE — Progress Notes (Signed)
PROGRESS NOTE    Tina Flores  [id number]  DOB: 07-Dec-1975  DOA: 02/06/2016 PCP: Wenda Low, MD  Hospital course: Tina Flores is a 40 y.o. 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 Symptoms started about 3 hours after eating some left over [place] food.  She says that she was the only one in the house that ate that leftover [place] food and she has been the only one sick.   She has had 3 ED visits in last week and supportive care has not stopped the diarrhea.  She has bowel movements approximately every 2 hours. Patient had associated fever at onset however has not had a fever in 2 days. She has had nausea. She had vomiting but this has been controlled with Zofran given her previous visit. She continues to drink fluids. She is currently taking Imodium and potassium supplementation. No known sick contacts. Only travel was to the [place] in October. The onset of this condition was acute. The course is constant. Aggravating factors: none. Alleviating factors: none.   Assessment & Plan:    1. Acute Infectious Gastroenteritis - secondary to salmonella.  Symptoms occurred after leftover [place] food consumption but has persisted for nearly 1 week. Still having severe diarrhea after starting cipro, cannot stay hydrated without ongoing IV fluids. Not medically safe for discharge at this time. [facility] following.  Pt still having severe diarrhea 3-5 stools per hour, will try lomotil to slow down the diarrhea (discussed risks with patient who verbalized understanding and wanted to try the lomotil as she is suffering now from many days of frequent diarrhea).    2. Severe dehydration - Needs continued IVF hydration.  Cannot keep down fluids  without severe diarrhea.  High Risk for readmission if sent home now.    3. Hypokalemia - improving with IV replacement, likely has total body depletion, continue to replete in IVFs, received Magnesium IV.  Recheck BMP, Mg in AM.  Monitor on telemetry.   4. Lower GI bleeding - Suspect from infectious diarrhea, Hg stable, following.  Heme test was positive, suspect this is related to acute infection, follow closely.  GI consult appreciated.  Hold heparins and use SCDs for DVT prophylaxis. 5. Type 2 Diabetes Mellitus - sensitive sliding scale coverage ordered, carb modified diet.  BS have been stable.  6. OSA - nightly CPAP ordered but patient declined to use. 7. Acute Renal Failure - Secondary to dehydration, hold nephrotoxic agents, Hydrate. Improving with IVF hydration, needs ongoing IV hydration to prevent worsening of condition.   8. Essential Hypertension - IV hydralazine ordered, holding home Lotrel secondary to ARF.   9. Moderate Persistent Asthma - compensated as long as she takes her daily medications which will resume in hospital.     DVT Prophylaxis:  SCDs Code Status: FULL  Family Communication: bedside  Disposition Plan: Pt needs further IV fluid hydration, further work up for cause of infectious diarrhea and not medically safe for discharge as she is still having severe diarrhea not able to eat/drink without severe diarrhea, high risk for further kidney damage   Consultants:  GI  Subjective: Pt says she is still having severe diarrhea every 2 hours and reports that she has diarrhea with every bite of food or sip of  water, including ice chips.  Still is able to see blood with BMs.   Objective: Vitals:   02/07/16 0625 02/07/16 1647 02/07/16 2053 02/08/16 0510  BP:  129/85 115/75 112/69  Pulse:  81 61 64  Resp:  18 18 18   Temp:   97.6 F (36.4 C) 98.6 F (37 C)  TempSrc:   Oral Oral  SpO2:  100% 100% 100%  Weight: 99.4 kg (219 lb 2.2 oz)     Height:        Intake/Output  Summary (Last 24 hours) at 02/08/16 1110 Last data filed at 02/08/16 0954  Gross per 24 hour  Intake              480 ml  Output                0 ml  Net              480 ml   Filed Weights   02/06/16 0922 02/07/16 0625  Weight: 95.3 kg (210 lb) 99.4 kg (219 lb 2.2 oz)    Exam:   General exam: Moderately built and nourished patient, lying comfortably supine on the gurney in no obvious distress.  Head, eyes and ENT: Nontraumatic and normocephalic. Pupils equally reacting to light and accommodation. Oral mucosa dry.  Neck: Supple. No JVD, carotid bruit or thyromegaly.  Lymphatics: No lymphadenopathy.  Respiratory system: Clear to auscultation. No increased work of breathing.  Cardiovascular system: S1 and S2 heard, RRR. No JVD, murmurs, gallops, clicks or pedal edema.  Gastrointestinal system: Abdomen is nondistended, soft and mild nonspecific tenderness no guarding. Normal bowel sounds heard. No organomegaly or masses appreciated.  Central nervous system: Alert and oriented. No focal neurological deficits.  Extremities: Symmetric 5 x 5 power. Peripheral pulses symmetrically felt.   Skin: No rashes or acute findings.  Musculoskeletal system: Negative exam.  Psychiatry: Pleasant and cooperative.  Data Reviewed: Basic Metabolic Panel:  Recent Labs Lab 02/01/16 1633 02/04/16 1149 02/06/16 1005 02/06/16 1905 02/07/16 0534 02/08/16 0546  NA 133* 134* 135  --  138 141  K 3.3* 2.9* 2.7*  --  3.4* 3.7  CL 107 105 103  --  112* 116*  CO2 18* 17* 19*  --  16* 20*  GLUCOSE 197* 166* 141*  --  143* 117*  BUN 8 24* 23*  --  16 12  CREATININE 0.86 1.51* 1.71* 1.17* 1.05* 1.02*  CALCIUM 8.8* 9.1 9.2  --  8.2* 8.5*  MG  --   --   --  2.1 2.5* 2.1  PHOS  --   --   --  2.1*  --   --    Liver Function Tests:  Recent Labs Lab 02/01/16 1633 02/04/16 1149 02/06/16 1005  AST 25 25 26   ALT 30 24 22   ALKPHOS 124 115 103  BILITOT 0.5 0.4 0.4  PROT 7.7 8.9* 8.8*  ALBUMIN  4.0 4.4 4.0    Recent Labs Lab 02/04/16 1149  LIPASE 33   No results for input(s): AMMONIA in the last 168 hours. CBC:  Recent Labs Lab 02/01/16 1633 02/04/16 1149 02/06/16 1005 02/06/16 1905 02/07/16 0534 02/08/16 0546  WBC 4.3 5.1 4.8 3.9* 4.2 4.9  NEUTROABS 3.4 3.5 2.4  --   --   --   HGB 11.6* 13.4 13.6 12.6 11.9* 11.1*  HCT 35.1* 40.0 39.2 37.1 36.0 33.9*  MCV 81.8 78.9 76.7* 77.8* 79.1 79.2  PLT 250 301 354 338 304 316  Cardiac Enzymes: No results for input(s): CKTOTAL, CKMB, CKMBINDEX, TROPONINI in the last 168 hours. CBG (last 3)   Recent Labs  02/07/16 1646 02/07/16 2052 02/08/16 0733  GLUCAP 105* 99 128*   Recent Results (from the past 240 hour(s))  Gastrointestinal Panel by PCR , Stool     Status: Abnormal   Collection Time: 02/06/16 10:10 AM  Result Value Ref Range Status   Campylobacter species NOT DETECTED NOT DETECTED Final   Plesimonas shigelloides NOT DETECTED NOT DETECTED Final   Salmonella species DETECTED (A) NOT DETECTED Final    Comment: RESULT CALLED TO, READ BACK BY AND VERIFIED WITH: AMY HARTLEY 02/07/16 1431 SGD    Yersinia enterocolitica NOT DETECTED NOT DETECTED Final   Vibrio species NOT DETECTED NOT DETECTED Final   Vibrio cholerae NOT DETECTED NOT DETECTED Final   Enteroaggregative E coli (EAEC) NOT DETECTED NOT DETECTED Final   Enteropathogenic E coli (EPEC) NOT DETECTED NOT DETECTED Final   Enterotoxigenic E coli (ETEC) NOT DETECTED NOT DETECTED Final   Shiga like toxin producing E coli (STEC) NOT DETECTED NOT DETECTED Final   Shigella/Enteroinvasive E coli (EIEC) NOT DETECTED NOT DETECTED Final   Cryptosporidium NOT DETECTED NOT DETECTED Final   Cyclospora cayetanensis NOT DETECTED NOT DETECTED Final   Entamoeba histolytica NOT DETECTED NOT DETECTED Final   Giardia lamblia NOT DETECTED NOT DETECTED Final   Adenovirus F40/41 NOT DETECTED NOT DETECTED Final   Astrovirus NOT DETECTED NOT DETECTED Final   Norovirus GI/GII NOT  DETECTED NOT DETECTED Final   Rotavirus A NOT DETECTED NOT DETECTED Final   Sapovirus (I, II, IV, and V) NOT DETECTED NOT DETECTED Final  C difficile quick scan w PCR reflex     Status: None   Collection Time: 02/06/16 10:10 AM  Result Value Ref Range Status   C Diff antigen NEGATIVE NEGATIVE Final   C Diff toxin NEGATIVE NEGATIVE Final   C Diff interpretation No C. difficile detected.  Final    Comment: Performed at [facility]    Studies: No results found.   Scheduled Meds: . ciprofloxacin  400 mg Intravenous Q12H  . insulin aspart  0-9 Units Subcutaneous TID WC  . insulin aspart  2 Units Subcutaneous TID WC  . mometasone-formoterol  2 puff Inhalation BID  . pneumococcal 23 valent vaccine  0.5 mL Intramuscular Tomorrow-1000  . ranitidine  300 mg Oral Daily  . sodium chloride flush  3 mL Intravenous Q12H   Continuous Infusions: . 0.9 % NaCl with KCl 40 mEq / L 150 mL/hr (02/08/16 0545)    Principal Problem:   Acute renal failure (HCC) Active Problems:   Acute infective gastroenteritis   Salmonella gastroenteritis   Hypokalemia   Acute gastroenteritis   Dehydration   Diarrhea   HTN (hypertension)   HSV-2 infection   HSV-1 infection   Type 2 diabetes mellitus (HCC)   Asthma, moderate persistent   Allergic rhinitis   Hyperlipidemia   Sleep apnea   Obesity, Class III, BMI 40-49.9 (morbid obesity) (HCC)   BRBPR (bright red blood per rectum)   Hematest positive stools  Time spent:   Irwin Brakeman, MD, FAAFP [facility] Pager [phone]  If 7PM-7AM, please contact night-coverage [email or web address] Password [email or web address] 02/08/2016, 11:10 AM    LOS: 1 day

## 2016-02-08 NOTE — Progress Notes (Signed)
[facility] Progress Note  Tina Flores 40 y.o. November 25, 1975  CC:  Bloody diarrhea   Subjective: Patient's abdominal pain is resolved. Nausea vomiting improving but continues to have bloody diarrhea with multiple bowel movements. On liquid diet.  ROS : Negative for abdominal pain, nausea, vomiting   Objective: Vital signs in last 24 hours: Vitals:   02/07/16 2053 02/08/16 0510  BP: 115/75 112/69  Pulse: 61 64  Resp: 18 18  Temp: 97.6 F (36.4 C) 98.6 F (37 C)    Physical Exam:  General:  Alert, cooperative, no distress, appears stated age  Head:  Normocephalic, without obvious abnormality, atraumatic  Eyes:  , EOM's intact,   Lungs:   Clear to auscultation bilaterally, respirations unlabored  Heart:  Regular rate and rhythm, S1, S2 normal  Abdomen:   Soft, non-tender, bowel sounds active all four quadrants,  no masses,   Extremities: Extremities normal, atraumatic, no  edema  Pulses: 2+ and symmetric    Lab Results:  Recent Labs  02/06/16 1905 02/07/16 0534 02/08/16 0546  NA  --  138 141  K  --  3.4* 3.7  CL  --  112* 116*  CO2  --  16* 20*  GLUCOSE  --  143* 117*  BUN  --  16 12  CREATININE 1.17* 1.05* 1.02*  CALCIUM  --  8.2* 8.5*  MG 2.1 2.5* 2.1  PHOS 2.1*  --   --     Recent Labs  02/06/16 1005  AST 26  ALT 22  ALKPHOS 103  BILITOT 0.4  PROT 8.8*  ALBUMIN 4.0    Recent Labs  02/06/16 1005  02/07/16 0534 02/08/16 0546  WBC 4.8  < > 4.2 4.9  NEUTROABS 2.4  --   --   --   HGB 13.6  < > 11.9* 11.1*  HCT 39.2  < > 36.0 33.9*  MCV 76.7*  < > 79.1 79.2  PLT 354  < > 304 316  < > = values in this interval not displayed. No results for input(s): LABPROT, INR in the last 72 hours.    Assessment/Plan: - Diarrhea. GI pathogen panel positive for salmonella. On ciprofloxacin. C. difficile negative - Blood in the stool. Probably from salmonella infection - Obesity status post duodenal switch in 2016 now with 100 pound weight  loss - Nausea and vomiting. Resolved  Recommendations -------------------------- - Continue IV ciprofloxacin for now. Recommend antibiotics for total 7 days. - Slowly advance diet. She  continues to have multiple bloody bowel movement - GI will follow   Otis Brace MD, FACP 02/08/2016, 8:38 AM  Pager [phone]  If no answer or after 5 PM call [phone]

## 2016-02-08 NOTE — Progress Notes (Signed)
Pt continues to refuse cpap.  Pt was advised that RT is available all night should she change her mind.

## 2016-02-09 LAB — BASIC METABOLIC PANEL
ANION GAP: 7 (ref 5–15)
BUN: 10 mg/dL (ref 6–20)
CO2: 18 mmol/L — ABNORMAL LOW (ref 22–32)
CREATININE: 0.97 mg/dL (ref 0.44–1.00)
Calcium: 8.3 mg/dL — ABNORMAL LOW (ref 8.9–10.3)
Chloride: 113 mmol/L — ABNORMAL HIGH (ref 101–111)
GFR calc Af Amer: >60 mL/min (ref >60)
Glucose, Bld: 112 mg/dL — ABNORMAL HIGH (ref 65–99)
Potassium: 3.4 mmol/L — ABNORMAL LOW (ref 3.5–5.1)
SODIUM: 138 mmol/L (ref 135–145)

## 2016-02-09 LAB — CBC
HCT: 32.6 % — ABNORMAL LOW (ref 36.0–46.0)
Hemoglobin: 11.4 g/dL — ABNORMAL LOW (ref 12.0–15.0)
MCH: 26.5 pg (ref 26.0–34.0)
MCHC: 35 g/dL (ref 30.0–36.0)
MCV: 75.8 fL — ABNORMAL LOW (ref 78.0–100.0)
Platelets: 317 K/uL (ref 150–400)
RBC: 4.3 MIL/uL (ref 3.87–5.11)
RDW: 13.7 % (ref 11.5–15.5)
WBC: 4.8 K/uL (ref 4.0–10.5)

## 2016-02-09 LAB — GLUCOSE, CAPILLARY
GLUCOSE-CAPILLARY: 112 mg/dL — AB (ref 65–99)
GLUCOSE-CAPILLARY: 115 mg/dL — AB (ref 65–99)
GLUCOSE-CAPILLARY: 117 mg/dL — AB (ref 65–99)
Glucose-Capillary: 124 mg/dL — ABNORMAL HIGH (ref 65–99)

## 2016-02-09 MED ORDER — CHOLESTYRAMINE LIGHT 4 G PO PACK: 4.0000 g | PACK | Freq: Four times a day (QID) | ORAL | Status: DC

## 2016-02-09 MED ORDER — CHOLESTYRAMINE LIGHT 4 G PO PACK
4.0000 g | PACK | Freq: Three times a day (TID) | ORAL | Status: DC
  Administered 2016-02-09 – 2016-02-10 (×2): 4 g via ORAL
  Filled 2016-02-09 (×4): qty 1

## 2016-02-09 MED ORDER — POTASSIUM CHLORIDE CRYS ER 20 MEQ PO TBCR
40.0000 meq | EXTENDED_RELEASE_TABLET | Freq: Once | ORAL | Status: AC
  Administered 2016-02-09: 40 meq via ORAL
  Filled 2016-02-09: qty 2

## 2016-02-09 NOTE — Progress Notes (Signed)
[facility] Progress Note  Tina Flores 40 y.o. [birth year]  CC:  Bloody diarrhea   Subjective:  Patient's diarrhea improving. Only had 3 bowel movement since 2 AM. Abdominal pain resolved. Tolerating soft diet.  ROS : Negative for abdominal pain, nausea, vomiting   Objective: Vital signs in last 24 hours: Vitals:   02/08/16 1900 02/09/16 0502  BP: 110/78 (!) 104/57  Pulse: 63 63  Resp: 15   Temp: 97.8 F (36.6 C) 97.8 F (36.6 C)    Physical Exam:  General:  Alert, cooperative, no distress, appears stated age  Head:  Normocephalic, without obvious abnormality, atraumatic  Eyes:  , EOM's intact,   Lungs:   Clear to auscultation bilaterally, respirations unlabored  Heart:  Regular rate and rhythm, S1, S2 normal  Abdomen:   Soft, non-tender, bowel sounds active all four quadrants,  no masses,   Extremities: Extremities normal, atraumatic, no  edema  Pulses: 2+ and symmetric    Lab Results:  Recent Labs  02/06/16 1905  02/07/16 0534 02/08/16 0546 02/09/16 0546  NA  --   < > 138 141 138  K  --   < > 3.4* 3.7 3.4*  CL  --   < > 112* 116* 113*  CO2  --   < > 16* 20* 18*  GLUCOSE  --   < > 143* 117* 112*  BUN  --   < > 16 12 10   CREATININE 1.17*  --  1.05* 1.02* 0.97  CALCIUM  --   < > 8.2* 8.5* 8.3*  MG 2.1  --  2.5* 2.1  --   PHOS 2.1*  --   --   --   --   < > = values in this interval not displayed. No results for input(s): AST, ALT, ALKPHOS, BILITOT, PROT, ALBUMIN in the last 72 hours.  Recent Labs  02/08/16 0546 02/09/16 0546  WBC 4.9 4.8  HGB 11.1* 11.4*  HCT 33.9* 32.6*  MCV 79.2 75.8*  PLT 316 317   No results for input(s): LABPROT, INR in the last 72 hours.    Assessment/Plan: - Diarrhea. GI pathogen panel positive for salmonella. On ciprofloxacin. C. difficile negative - Blood in the stool. Probably from salmonella infection - Obesity status post duodenal switch in 2016 now with 100 pound weight loss - Nausea and vomiting.  Resolved  Recommendations -------------------------- - Continue IV ciprofloxacin for now.Okay to change to by mouth 500 mg twice a day once improvement in diarrhea Recommend antibiotics for total 7 days. - Continue current diet - Consider adding cholestyramine if continues to have severe diarrhea - GI will follow   Otis Brace MD, FACP 02/09/2016, 11:30 AM  Pager [phone]  If no answer or after 5 PM call [phone]

## 2016-02-09 NOTE — Progress Notes (Signed)
PROGRESS NOTE    Tina Flores  [id number]  DOB: [birth year]  DOA: 02/06/2016 PCP: Wenda Low, MD  Hospital course: Tina Flores is a 40 y.o. 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 Symptoms started about 3 hours after eating some left over [place] food.  She says that she was the only one in the house that ate that leftover [place] food and she has been the only one sick.   She has had 3 ED visits in last week and supportive care has not stopped the diarrhea.  She has bowel movements approximately every 2 hours. Patient had associated fever at onset however has not had a fever in 2 days. She has had nausea. She had vomiting but this has been controlled with Zofran given her previous visit. She continues to drink fluids. She is currently taking Imodium and potassium supplementation. No known sick contacts. Only travel was to the [place] in October. The onset of this condition was acute. The course is constant. Aggravating factors: none. Alleviating factors: none.   Assessment & Plan:    1. Acute Infectious Gastroenteritis - secondary to salmonella.  Symptoms occurred after leftover [place] food consumption but has persisted for nearly 1 week. Still having severe diarrhea after starting cipro, cannot stay hydrated without ongoing IV fluids. Not medically safe for discharge at this time. [facility] following.  Try cholestyramine to help with diarrhea. 2. Severe dehydration - Needs continued IVF hydration.  Cannot keep down fluids without severe diarrhea.  High Risk for readmission if sent home now.    3. Hypokalemia - improved with IV replacement, likely has total body depletion, received Magnesium IV.  Give oral dose 12/10.  Monitor on  telemetry.   4. Lower GI bleeding - Suspect from infectious diarrhea, Hg stable, following.  Heme test was positive, suspect this is related to acute infection, follow closely.  GI consult appreciated.  Hold heparins and use SCDs for DVT prophylaxis. 5. Type 2 Diabetes Mellitus - sensitive sliding scale coverage ordered, carb modified diet.  BS have been stable.  6. OSA - nightly CPAP ordered but patient declined to use. Will dc order.  7. Acute Renal Failure - Resolved now.  Creatinine normal.  Secondary to dehydration, hold nephrotoxic agents, Hydrate. Improving with IVF hydration.   8. Essential Hypertension - IV hydralazine ordered, holding home Lotrel secondary to ARF.   9. Moderate Persistent Asthma - compensated as long as she takes her daily medications which will resume in hospital.     DVT Prophylaxis:  SCDs Code Status: FULL  Family Communication: bedside  Disposition Plan: Likely can discharge home 12/11 with close outpatient follow up.    Consultants:  GI  Subjective: Pt says diarrhea is less and wants to advance diet today.   Objective: Vitals:   02/08/16 0510 02/08/16 1353 02/08/16 1900 02/09/16 0502  BP: 112/69 129/89 110/78 (!) 104/57  Pulse: 64 71 63 63  Resp: 18 18 15    Temp: 98.6 F (37 C)  97.8 F (36.6 C) 97.8 F (36.6 C)  TempSrc: Oral  Oral Oral  SpO2: 100% 100% 100% 100%  Weight:   100 kg (220 lb 7.4 oz)   Height:        Intake/Output Summary (Last 24 hours) at  02/09/16 1154 Last data filed at 02/09/16 0524  Gross per 24 hour  Intake           2032.5 ml  Output                0 ml  Net           2032.5 ml   Filed Weights   02/06/16 0922 02/07/16 0625 02/08/16 1900  Weight: 95.3 kg (210 lb) 99.4 kg (219 lb 2.2 oz) 100 kg (220 lb 7.4 oz)    Exam:   General exam: Moderately built and nourished patient, lying comfortably supine on the gurney in no obvious distress.  Head, eyes and ENT: Nontraumatic and normocephalic. Pupils equally reacting to  light and accommodation. Oral mucosa dry.  Neck: Supple. No JVD, carotid bruit or thyromegaly.  Lymphatics: No lymphadenopathy.  Respiratory system: Clear to auscultation. No increased work of breathing.  Cardiovascular system: S1 and S2 heard, RRR. No JVD, murmurs, gallops, clicks or pedal edema.  Gastrointestinal system: Abdomen is nondistended, soft and mild nonspecific tenderness no guarding. Normal bowel sounds heard. No organomegaly or masses appreciated.  Central nervous system: Alert and oriented. No focal neurological deficits.  Extremities: Symmetric 5 x 5 power. Peripheral pulses symmetrically felt.   Skin: No rashes or acute findings.  Musculoskeletal system: Negative exam.  Psychiatry: Pleasant and cooperative.  Data Reviewed: Basic Metabolic Panel:  Recent Labs Lab 02/04/16 1149 02/06/16 1005 02/06/16 1905 02/07/16 0534 02/08/16 0546 02/09/16 0546  NA 134* 135  --  138 141 138  K 2.9* 2.7*  --  3.4* 3.7 3.4*  CL 105 103  --  112* 116* 113*  CO2 17* 19*  --  16* 20* 18*  GLUCOSE 166* 141*  --  143* 117* 112*  BUN 24* 23*  --  16 12 10   CREATININE 1.51* 1.71* 1.17* 1.05* 1.02* 0.97  CALCIUM 9.1 9.2  --  8.2* 8.5* 8.3*  MG  --   --  2.1 2.5* 2.1  --   PHOS  --   --  2.1*  --   --   --    Liver Function Tests:  Recent Labs Lab 02/04/16 1149 02/06/16 1005  AST 25 26  ALT 24 22  ALKPHOS 115 103  BILITOT 0.4 0.4  PROT 8.9* 8.8*  ALBUMIN 4.4 4.0    Recent Labs Lab 02/04/16 1149  LIPASE 33   No results for input(s): AMMONIA in the last 168 hours. CBC:  Recent Labs Lab 02/04/16 1149 02/06/16 1005 02/06/16 1905 02/07/16 0534 02/08/16 0546 02/09/16 0546  WBC 5.1 4.8 3.9* 4.2 4.9 4.8  NEUTROABS 3.5 2.4  --   --   --   --   HGB 13.4 13.6 12.6 11.9* 11.1* 11.4*  HCT 40.0 39.2 37.1 36.0 33.9* 32.6*  MCV 78.9 76.7* 77.8* 79.1 79.2 75.8*  PLT 301 354 338 304 316 317   Cardiac Enzymes: No results for input(s): CKTOTAL, CKMB, CKMBINDEX,  TROPONINI in the last 168 hours. CBG (last 3)   Recent Labs  02/08/16 1952 02/09/16 0731 02/09/16 1132  GLUCAP 110* 112* 115*   Recent Results (from the past 240 hour(s))  Gastrointestinal Panel by PCR , Stool     Status: Abnormal   Collection Time: 02/06/16 10:10 AM  Result Value Ref Range Status   Campylobacter species NOT DETECTED NOT DETECTED Final   Plesimonas shigelloides NOT DETECTED NOT DETECTED Final   Salmonella species DETECTED (A) NOT DETECTED Final  Comment: RESULT CALLED TO, READ BACK BY AND VERIFIED WITH: AMY HARTLEY 02/07/16 1431 SGD    Yersinia enterocolitica NOT DETECTED NOT DETECTED Final   Vibrio species NOT DETECTED NOT DETECTED Final   Vibrio cholerae NOT DETECTED NOT DETECTED Final   Enteroaggregative E coli (EAEC) NOT DETECTED NOT DETECTED Final   Enteropathogenic E coli (EPEC) NOT DETECTED NOT DETECTED Final   Enterotoxigenic E coli (ETEC) NOT DETECTED NOT DETECTED Final   Shiga like toxin producing E coli (STEC) NOT DETECTED NOT DETECTED Final   Shigella/Enteroinvasive E coli (EIEC) NOT DETECTED NOT DETECTED Final   Cryptosporidium NOT DETECTED NOT DETECTED Final   Cyclospora cayetanensis NOT DETECTED NOT DETECTED Final   Entamoeba histolytica NOT DETECTED NOT DETECTED Final   Giardia lamblia NOT DETECTED NOT DETECTED Final   Adenovirus F40/41 NOT DETECTED NOT DETECTED Final   Astrovirus NOT DETECTED NOT DETECTED Final   Norovirus GI/GII NOT DETECTED NOT DETECTED Final   Rotavirus A NOT DETECTED NOT DETECTED Final   Sapovirus (I, II, IV, and V) NOT DETECTED NOT DETECTED Final  C difficile quick scan w PCR reflex     Status: None   Collection Time: 02/06/16 10:10 AM  Result Value Ref Range Status   C Diff antigen NEGATIVE NEGATIVE Final   C Diff toxin NEGATIVE NEGATIVE Final   C Diff interpretation No C. difficile detected.  Final    Comment: Performed at [facility]    Studies: No results found.   Scheduled Meds: . cholestyramine  light  4 g Oral TID AC  . ciprofloxacin  400 mg Intravenous Q12H  . insulin aspart  0-9 Units Subcutaneous TID WC  . insulin aspart  2 Units Subcutaneous TID WC  . mometasone-formoterol  2 puff Inhalation BID  . pneumococcal 23 valent vaccine  0.5 mL Intramuscular Tomorrow-1000  . ranitidine  300 mg Oral Daily  . sodium chloride flush  3 mL Intravenous Q12H   Continuous Infusions: . sodium chloride 125 mL/hr at 02/09/16 0908    Principal Problem:   Acute renal failure (HCC) Active Problems:   Acute infective gastroenteritis   Salmonella gastroenteritis   Hypokalemia   Acute gastroenteritis   Dehydration   Diarrhea   HTN (hypertension)   HSV-2 infection   HSV-1 infection   Type 2 diabetes mellitus (HCC)   Asthma, moderate persistent   Allergic rhinitis   Hyperlipidemia   Sleep apnea   Obesity, Class III, BMI 40-49.9 (morbid obesity) (HCC)   BRBPR (bright red blood per rectum)   Hematest positive stools  Time spent:   Irwin Brakeman, MD, FAAFP [facility] Pager [phone]  If 7PM-7AM, please contact night-coverage [email or web address] Password [email or web address] 02/09/2016, 11:54 AM    LOS: 2 days

## 2016-02-10 DIAGNOSIS — A020 Salmonella enteritis: Secondary | ICD-10-CM | POA: Diagnosis not present

## 2016-02-10 DIAGNOSIS — E876 Hypokalemia: Secondary | ICD-10-CM | POA: Diagnosis not present

## 2016-02-10 DIAGNOSIS — D649 Anemia, unspecified: Secondary | ICD-10-CM | POA: Diagnosis not present

## 2016-02-10 DIAGNOSIS — E119 Type 2 diabetes mellitus without complications: Secondary | ICD-10-CM | POA: Diagnosis not present

## 2016-02-10 DIAGNOSIS — E860 Dehydration: Secondary | ICD-10-CM | POA: Diagnosis not present

## 2016-02-10 DIAGNOSIS — B009 Herpesviral infection, unspecified: Secondary | ICD-10-CM | POA: Diagnosis not present

## 2016-02-10 DIAGNOSIS — N179 Acute kidney failure, unspecified: Secondary | ICD-10-CM | POA: Diagnosis not present

## 2016-02-10 DIAGNOSIS — E785 Hyperlipidemia, unspecified: Secondary | ICD-10-CM | POA: Diagnosis not present

## 2016-02-10 LAB — GLUCOSE, CAPILLARY: Glucose-Capillary: 105 mg/dL — ABNORMAL HIGH (ref 65–99)

## 2016-02-10 LAB — PATHOLOGIST SMEAR REVIEW

## 2016-02-10 MED ORDER — CIPROFLOXACIN HCL 500 MG PO TABS: 500.0000 mg | ORAL_TABLET | Freq: Two times a day (BID) | ORAL | 0 refills | Status: AC

## 2016-02-10 MED ORDER — CHOLESTYRAMINE LIGHT 4 G PO PACK: 4.0000 g | PACK | Freq: Three times a day (TID) | ORAL | 0 refills | Status: DC

## 2016-02-10 MED FILL — CHOLESTYRAMINE LIGHT PACKET: 4 | 4 days supply | Qty: 12 | Fill #0

## 2016-02-10 MED FILL — CIPROFLOXACIN HCL 500 MG TA: 500 | 3 days supply | Qty: 6 | Fill #0

## 2016-02-10 NOTE — Discharge Instructions (Signed)
Bloody Diarrhea Bloody diarrhea is frequent loose and watery bowel movements that contain blood. The blood can be hard to see (occult) or notice. Bloody diarrhea may be caused by medical conditions such as:  Ulcerative colitis.  Crohn disease.  Intestinal infection.  Viral gastroenteritis or bacterial gastroenteritis. Finding out why there is blood is in your diarrhea is necessary so your health care provider can prescribe the right treatment for you. Follow the instructions from your health care provider about treating the cause of your bloody diarrhea. Any type of diarrhea can make you feel weak and dehydrated. Dehydration can make you tired and thirsty, cause you to have a dry mouth, and decrease how often you urinate. Follow these instructions at home: Follow instructions from your health care provider about how to care for yourself at home. Eating and drinking Follow these recommendations as told by your health care provider:  Take an oral rehydration solution (ORS). This is a drink that is sold at pharmacies and retail stores.  Drink clear fluids such as water, ice chips, diluted fruit juice, and low-calorie sports drinks.  Eat bland, easy-to-digest foods in small amounts as you are able. These foods include bananas, applesauce, rice, lean meats, toast, and crackers.  Avoid drinking fluids that contain a lot of sugar or caffeine, such as energy drinks, sports drinks, and soda.  Avoid alcohol.  Avoid spicy or fatty foods. General instructions  Drink enough fluid to keep your urine clear or pale yellow.  Wash your hands often. If soap and water are not available, use hand sanitizer.  Make sure that all people in your household wash their hands well and often.  Take over-the-counter and prescription medicines only as told by your health care provider.  Rest at home while you recover.  Take a warm bath to relieve any burning or pain from frequent diarrhea episodes.  Watch  your condition for any changes.  Keep all follow-up visits as told by your health care provider. This is important. Contact a health care provider if:  You have a fever.  You have new symptoms.  Your diarrhea gets worse.  You cannot keep fluids down.  You have a headache.  You feel light-headed or dizzy.  You have muscle cramps. Get help right away if:  You have chest pain.  You feel extremely weak or you faint.  The blood in your diarrhea increases or turns a different color.  You vomit and the vomit is bloody or looks black.  You have persistent diarrhea.  You have severe pain, cramping, or bloating in your abdomen.  You have trouble breathing or you are breathing very quickly.  Your heart is beating very quickly.  Your skin feels cold or clammy.  You feel confused.  You have signs of dehydration, such as:  Dark urine, very little urine, or no urine.  Cracked lips.  Dry mouth.  Sunken eyes.  Sleepiness.  Weakness. This information is not intended to replace advice given to you by your health care provider. Make sure you discuss any questions you have with your health care provider. Document Released: 02/16/2005 Document Revised: 06/27/2015 Document Reviewed: 10/23/2014 Elsevier Interactive Patient Education  2017 Else[employer] Inc[employer]   Dehydration, Adult Dehydration is when there is not enough fluid or water in your body. This happens when you lose more fluids than you take in. Dehydration can range from mild to very bad. It should be treated right away to keep it from getting very bad. Symptoms of mild  dehydration may include:  Thirst.  Dry lips.  Slightly dry mouth.  Dry, warm skin.  Dizziness. Symptoms of moderate dehydration may include:  Very dry mouth.  Muscle cramps.  Dark pee (urine). Pee may be the color of tea.  Your body making less pee.  Your eyes making fewer tears.  Heartbeat that is uneven or faster than normal  (palpitations).  Headache.  Light-headedness, especially when you stand up from sitting.  Fainting (syncope). Symptoms of very bad dehydration may include:  Changes in skin, such as:  Cold and clammy skin.  Blotchy (mottled) or pale skin.  Skin that does not quickly return to normal after being lightly pinched and let go (poor skin turgor).  Changes in body fluids, such as:  Feeling very thirsty.  Your eyes making fewer tears.  Not sweating when body temperature is high, such as in hot weather.  Your body making very little pee.  Changes in vital signs, such as:  Weak pulse.  Pulse that is more than 100 beats a minute when you are sitting still.  Fast breathing.  Low blood pressure.  Other changes, such as:  Sunken eyes.  Cold hands and feet.  Confusion.  Lack of energy (lethargy).  Trouble waking up from sleep.  Short-term weight loss.  Unconsciousness. Follow these instructions at home:  If told by your doctor, drink an ORS:  Make an ORS by using instructions on the package.  Start by drinking small amounts, about  cup (120 mL) every 5-10 minutes.  Slowly drink more until you have had the amount that your doctor said to have.  Drink enough clear fluid to keep your pee clear or pale yellow. If you were told to drink an ORS, finish the ORS first, then start slowly drinking clear fluids. Drink fluids such as:  Water. Do not drink only water by itself. Doing that can make the salt (sodium) level in your body get too low (hyponatremia).  Ice chips.  Fruit juice that you have added water to (diluted).  Low-calorie sports drinks.  Avoid:  Alcohol.  Drinks that have a lot of sugar. These include high-calorie sports drinks, fruit juice that does not have water added, and soda.  Caffeine.  Foods that are greasy or have a lot of fat or sugar.  Take over-the-counter and prescription medicines only as told by your doctor.  Do not take salt  tablets. Doing that can make the salt level in your body get too high (hypernatremia).  Eat foods that have minerals (electrolytes). Examples include bananas, oranges, potatoes, tomatoes, and spinach.  Keep all follow-up visits as told by your doctor. This is important. Contact a doctor if:  You have belly (abdominal) pain that:  Gets worse.  Stays in one area (localizes).  You have a rash.  You have a stiff neck.  You get angry or annoyed more easily than normal (irritability).  You are more sleepy than normal.  You have a harder time waking up than normal.  You feel:  Weak.  Dizzy.  Very thirsty.  You have peed (urinated) only a small amount of very dark pee during 6-8 hours. Get help right away if:  You have symptoms of very bad dehydration.  You cannot drink fluids without throwing up (vomiting).  Your symptoms get worse with treatment.  You have a fever.  You have a very bad headache.  You are throwing up or having watery poop (diarrhea) and it:  Gets worse.  Does not  go away.  You have blood or something green (bile) in your throw-up.  You have blood in your poop (stool). This may cause poop to look black and tarry.  You have not peed in 6-8 hours.  You pass out (faint).  Your heart rate when you are sitting still is more than 100 beats a minute.  You have trouble breathing. This information is not intended to replace advice given to you by your health care provider. Make sure you discuss any questions you have with your health care provider. Document Released: 12/13/2008 Document Revised: 09/06/2015 Document Reviewed: 04/12/2015 Elsevier Interactive Patient Education  2017 Elsevier Inc.   Acute Kidney Injury, Adult Acute kidney injury (AKI) occurs when there is sudden (acute) damage to the kidneys.A small amount of kidney damage may not cause problems, but a large amount of damage may make it difficult or impossible for the kidneys to work  the way they should. AKI may develop into long-lasting (chronic) kidney disease. Early detection and treatment of AKI may prevent kidney damage from becoming permanent or getting worse. What are the causes? Common causes of this condition include:  A problem with blood flow to the kidneys. This may be caused by:  Blood loss.  Heart and blood vessel (cardiovascular) disease.  Severe burns.  Liver disease.  Direct damage to the kidneys. This may be caused by:  Some medicines.  A kidney infection.  Poisoning.  Being around or in contact with poisonous (toxic) substances.  A surgical wound.  A hard, direct force to the kidney area.  A sudden blockage of urine flow. This may be caused by:  Cancer.  Kidney stones.  Enlarged prostate in males. What are the signs or symptoms? Symptoms develop slowly and may not be obvious until the kidney damage becomes severe. It is possible to have AKI for years without showing any symptoms. Symptoms of this condition can include:  Swelling (edema) of the face, legs, ankles, or feet.  Numbness, tingling, or loss of feeling (sensation) in the hands or feet.  Tiredness (lethargy).  Nausea or vomiting.  Confusion or trouble concentrating.  Problems with urination, such as:  Painful or burning feeling during urination.  Decreased urine production.  Frequent urination, especially at night.  Bloody urine.  Muscle twitches and cramps, especially in the legs.  Shortness of breath.  Weakness.  Constant itchiness.  Loss of appetite.  Metallic taste in the mouth.  Trouble sleeping.  Pale lining of the eyelids and surface of the eye (conjunctiva). How is this diagnosed? This condition may be diagnosed with various tests. Tests may include:  Blood tests.  Urine tests.  Imaging tests.  A test in which a sample of tissue is removed from the kidneys to be looked at under a microscope (kidney biopsy). How is this  treated? Treatment of AKIvaries depending on the cause and severity of the kidney damage. In mild cases, treatment may not be needed. The kidneys may heal on their own. If AKI is more severe, your health care provider will treat the cause of the kidney damage, help the kidneys heal, and prevent problems from occurring. Severe cases may require a procedure to remove toxic wastes from the body (dialysis) or surgery to repair kidney damage. Surgery may involve:  Repair of a torn kidney.  Removal of a urine flow obstruction. Follow these instructions at home:  Follow your prescribed diet.  Take over-the-counter and prescription medicines only as told by your health care provider.  Do  not take any new medicines unless approved by your health care provider. Many medicines can worsen your kidney damage.  Do not take any vitamin and mineral supplements unless approved by your health care provider. Many nutritional supplements can worsen your kidney damage.  The dose of some medicines that you take may need to be adjusted.  Do not use any tobacco products, such as cigarettes, chewing tobacco, and e-cigarettes. If you need help quitting, ask your health care provider.  Keep all follow-up visits as told by your health care provider. This is important.  Keep track of your blood pressure. Report changes in your blood pressure as told by your health care provider.  Achieve and maintain a healthy weight. If you need help with this, ask your health care provider.  Start or continue an exercise plan. Try to exercise at least 30 minutes a day, 5 days a week.  Stay current with immunizations as told by your health care provider. Where to find more information:  American Association of Kidney Patients: BombTimer.gl  National Kidney Foundation: www.kidney.[place]: https://mathis.com/  [facility]: www.lifeoptions.org and www.kidneyschool.org Contact a health  care provider if:  Your symptoms get worse.  You develop new symptoms. Get help right away if:  You develop symptoms of end-stage kidney disease, which include:  Headaches.  Abnormally dark or light skin.  Numbness in the hands or feet.  Easy bruising.  Frequent hiccups.  Chest pain.  Shortness of breath.  End of menstruation in women.  You have a fever.  You have decreased urine production.  You have pain or bleeding when you urinate. This information is not intended to replace advice given to you by your health care provider. Make sure you discuss any questions you have with your health care provider. Document Released: 09/01/2010 Document Revised: 09/26/2015 Document Reviewed: 10/16/2011 Elsevier Interactive Patient Education  2017 [place].   Diabetes Mellitus and Standards of Medical Care Managing diabetes (diabetes mellitus) can be complicated. Your diabetes treatment may be managed by a team of health care providers, including:  A diet and nutrition specialist (registered dietitian).  A nurse.  A certified diabetes educator (CDE).  A diabetes specialist (endocrinologist).  An eye doctor.  A primary care provider.  A dentist. Your health care providers follow a schedule in order to help you get the best quality of care. The following schedule is a general guideline for your diabetes management plan. Your health care providers may also give you more specific instructions. HbA1c ( hemoglobin A1c) test This test provides information about blood sugar (glucose) control over the previous 2-3 months. It is used to check whether your diabetes management plan needs to be adjusted.  If you are meeting your treatment goals, this test is done at least 2 times a year.  If you are not meeting treatment goals or if your treatment goals have changed, this test is done 4 times a year. Blood pressure test  This test is done at every routine medical visit. For  most people, the goal is less than 140/90. In some cases, your goal blood pressure may be 130/80 or less. Ask your health care provider what your goal blood pressure should be. Dental and eye exams  Visit your dentist two times a year.  If you have type 1 diabetes, get an eye exam 3-5 years after you are diagnosed, and then once a year after your first exam.  If you were diagnosed with type 1  diabetes as a child, get an eye exam when you are age 41 or older and have had diabetes for 3-5 years. After the first exam, you should get an eye exam once a year.  If you have type 2 diabetes, have an eye exam as soon as you are diagnosed, and then once a year after your first exam. Foot care exam  Visual foot exams are done at every routine medical visit. The exams check for cuts, bruises, redness, blisters, sores, or other problems with the feet.  A complete foot exam is done by your health care provider once a year. This exam includes an inspection of the structure and skin of your feet, and a check of the pulses and sensation in your feet.  Type 1 diabetes: Get your first exam 3-5 years after diagnosis.  Type 2 diabetes: Get your first exam as soon as you are diagnosed.  Check your feet every day for cuts, bruises, redness, blisters, or sores. If you have any of these or other problems that are not healing, contact your health care provider. Kidney function test ( urine microalbumin)  This test is done once a year.  Type 1 diabetes: Get your first test 5 years after diagnosis.  Type 2 diabetes: Get your first test as soon as you are diagnosed.  If you have chronic kidney disease (CKD), get a serum creatinine and estimated glomerular filtration rate (eGFR) test once a year. Lipid profile (cholesterol, HDL, LDL, triglycerides)  This test should be done when you are diagnosed with diabetes, and every 5 years after the first test. If you are on medicines to lower your cholesterol, you may need  to get this test done every year.  The goal for LDL is less than 100 mg/dL (5.5 mmol/L). If you are at high risk, the goal is less than 70 mg/dL (3.9 mmol/L).  The goal for HDL is 40 mg/dL (2.2 mmol/L) for men and 50 mg/dL(2.8 mmol/L) for women. An HDL cholesterol of 60 mg/dL (3.3 mmol/L) or higher gives some protection against heart disease.  The goal for triglycerides is less than 150 mg/dL (8.3 mmol/L). Immunizations  The yearly flu (influenza) vaccine is recommended for everyone 6 months or older who has diabetes.  The pneumonia (pneumococcal) vaccine is recommended for everyone 2 years or older who has diabetes. If you are 53 or older, you may get the pneumonia vaccine as a series of two separate shots.  The hepatitis B vaccine is recommended for adults shortly after they have been diagnosed with diabetes.  The Tdap (tetanus, diphtheria, and pertussis) vaccine should be given:  According to normal childhood vaccination schedules, for children.  Every 10 years, for adults who have diabetes.  The shingles vaccine is recommended for people who have had chicken pox and are 50 years or older. Mental and emotional health  Screening for symptoms of eating disorders, anxiety, and depression is recommended at the time of diagnosis and afterward as needed. If your screening shows that you have symptoms (you have a positive screening result), you may need further evaluation and be referred to a mental health care provider. Diabetes self-management education  Education about how to manage your diabetes is recommended at diagnosis and ongoing as needed. Treatment plan  Your treatment plan will be reviewed at every medical visit. Summary  Managing diabetes (diabetes mellitus) can be complicated. Your diabetes treatment may be managed by a team of health care providers.  Your health care providers follow a schedule  in order to help you get the best quality of care.  Standards of care  including having regular physical exams, blood tests, blood pressure monitoring, immunizations, screening tests, and education about how to manage your diabetes.  Your health care providers may also give you more specific instructions based on your individual health. This information is not intended to replace advice given to you by your health care provider. Make sure you discuss any questions you have with your health care provider. Document Released: 12/14/2008 Document Revised: 11/15/2015 Document Reviewed: 11/15/2015 Elsevier Interactive Patient Education  2017 [place] Choices to Help Relieve Diarrhea, Adult When you have diarrhea, the foods you eat and your eating habits are very important. Choosing the right foods and drinks can help relieve diarrhea. Also, because diarrhea can last up to 7 days, you need to replace lost fluids and electrolytes (such as sodium, potassium, and chloride) in order to help prevent dehydration. What general guidelines do I need to follow?  Slowly drink 1 cup (8 oz) of fluid for each episode of diarrhea. If you are getting enough fluid, your urine will be clear or pale yellow.  Eat starchy foods. Some good choices include white rice, white toast, pasta, low-fiber cereal, baked potatoes (without the skin), saltine crackers, and bagels.  Avoid large servings of any cooked vegetables.  Limit fruit to two servings per day. A serving is  cup or 1 small piece.  Choose foods with less than 2 g of fiber per serving.  Limit fats to less than 8 tsp (38 g) per day.  Avoid fried foods.  Eat foods that have probiotics in them. Probiotics can be found in certain dairy products.  Avoid foods and beverages that may increase the speed at which food moves through the stomach and intestines (gastrointestinal tract). Things to avoid include:  High-fiber foods, such as dried fruit, raw fruits and vegetables, nuts, seeds, and whole grain foods.  Spicy foods  and high-fat foods.  Foods and beverages sweetened with high-fructose corn syrup, honey, or sugar alcohols such as xylitol, sorbitol, and mannitol. What foods are recommended? Grains  White rice. White, [place], or pita breads (fresh or toasted), including plain rolls, buns, or bagels. White pasta. Saltine, soda, or graham crackers. Pretzels. Low-fiber cereal. Cooked cereals made with water (such as cornmeal, farina, or cream cereals). Plain muffins. Matzo. Melba toast. Zwieback. Vegetables  Potatoes (without the skin). Strained tomato and vegetable juices. Most well-cooked and canned vegetables without seeds. Tender lettuce. Fruits  Cooked or canned applesauce, apricots, cherries, fruit cocktail, grapefruit, peaches, pears, or plums. Fresh bananas, apples without skin, cherries, grapes, cantaloupe, grapefruit, peaches, oranges, or plums. Meat and Other Protein Products  Baked or boiled chicken. Eggs. Tofu. Fish. Seafood. Smooth peanut butter. Ground or well-cooked tender beef, ham, veal, lamb, pork, or poultry. Dairy  Plain yogurt, kefir, and unsweetened liquid yogurt. Lactose-free milk, buttermilk, or soy milk. Plain hard cheese. Beverages  Sport drinks. Clear broths. Diluted fruit juices (except prune). Regular, caffeine-free sodas such as ginger ale. Water. Decaffeinated teas. Oral rehydration solutions. Sugar-free beverages not sweetened with sugar alcohols. Other  Bouillon, broth, or soups made from recommended foods. The items listed above may not be a complete list of recommended foods or beverages. Contact your dietitian for more options.  What foods are not recommended? Grains  Whole grain, whole wheat, bran, or rye breads, rolls, pastas, crackers, and cereals. Wild or brown rice. Cereals that contain more than 2 g of fiber  per serving. Corn tortillas or taco shells. Cooked or dry oatmeal. Granola. Popcorn. Vegetables  Raw vegetables. Cabbage, broccoli, Brussels sprouts, artichokes,  baked beans, beet greens, corn, kale, legumes, peas, sweet potatoes, and yams. Potato skins. Cooked spinach and cabbage. Fruits  Dried fruit, including raisins and dates. Raw fruits. Stewed or dried prunes. Fresh apples with skin, apricots, mangoes, pears, raspberries, and strawberries. Meat and Other Protein Products  Chunky peanut butter. Nuts and seeds. Beans and lentils. Berniece Salines. Dairy  High-fat cheeses. Milk, chocolate milk, and beverages made with milk, such as milk shakes. Cream. Ice cream. Sweets and Desserts  Sweet rolls, doughnuts, and sweet breads. Pancakes and waffles. Fats and Oils  Butter. Cream sauces. Margarine. Salad oils. Plain salad dressings. Olives. Avocados. Beverages  Caffeinated beverages (such as coffee, tea, soda, or energy drinks). Alcoholic beverages. Fruit juices with pulp. Prune juice. Soft drinks sweetened with high-fructose corn syrup or sugar alcohols. Other  Coconut. Hot sauce. Chili powder. Mayonnaise. Gravy. Cream-based or milk-based soups. The items listed above may not be a complete list of foods and beverages to avoid. Contact your dietitian for more information.  What should I do if I become dehydrated? Diarrhea can sometimes lead to dehydration. Signs of dehydration include dark urine and dry mouth and skin. If you think you are dehydrated, you should rehydrate with an oral rehydration solution. These solutions can be purchased at pharmacies, retail stores, or online. Drink -1 cup (120-240 mL) of oral rehydration solution each time you have an episode of diarrhea. If drinking this amount makes your diarrhea worse, try drinking smaller amounts more often. For example, drink 1-3 tsp (5-15 mL) every 5-10 minutes. A general rule for staying hydrated is to drink 1-2 L of fluid per day. Talk to your health care provider about the specific amount you should be drinking each day. Drink enough fluids to keep your urine clear or pale yellow. This information is not  intended to replace advice given to you by your health care provider. Make sure you discuss any questions you have with your health care provider. Document Released: 05/09/2003 Document Revised: 07/25/2015 Document Reviewed: 01/09/2013 Elsevier Interactive Patient Education  2017 [place].   Diarrhea, Adult Introduction Diarrhea is when you have loose and water poop (stool) often. Diarrhea can make you feel weak and cause you to get dehydrated. Dehydration can make you tired and thirsty, make you have a dry mouth, and make it so you pee (urinate) less often. Diarrhea often lasts 2-3 days. However, it can last longer if it is a sign of something more serious. It is important to treat your diarrhea as told by your doctor. Follow these instructions at home: Eating and drinking Follow these recommendations as told by your doctor:  Take an oral rehydration solution (ORS). This is a drink that is sold at pharmacies and stores.  Drink clear fluids, such as:  Water.  Ice chips.  Diluted fruit juice.  Low-calorie sports drinks.  Eat bland, easy-to-digest foods in small amounts as you are able. These foods include:  Bananas.  Applesauce.  Rice.  Low-fat (lean) meats.  Toast.  Crackers.  Avoid drinking fluids that have a lot of sugar or caffeine in them.  Avoid alcohol.  Avoid spicy or fatty foods. General instructions  Drink enough fluid to keep your pee (urine) clear or pale yellow.  Wash your hands often. If you cannot use soap and water, use hand sanitizer.  Make sure that all people in your home wash  their hands well and often.  Take over-the-counter and prescription medicines only as told by your doctor.  Rest at home while you get better.  Watch your condition for any changes.  Take a warm bath to help with any burning or pain from having diarrhea.  Keep all follow-up visits as told by your doctor. This is important. Contact a doctor if:  You have a  fever.  Your diarrhea gets worse.  You have new symptoms.  You cannot keep fluids down.  You feel light-headed or dizzy.  You have a headache.  You have muscle cramps. Get help right away if:  You have chest pain.  You feel very weak or you pass out (faint).  You have bloody or black poop or poop that look like tar.  You have very bad pain, cramping, or bloating in your belly (abdomen).  You have trouble breathing or you are breathing very quickly.  Your heart is beating very quickly.  Your skin feels cold and clammy.  You feel confused.  You have signs of dehydration, such as:  Dark pee, hardly any pee, or no pee.  Cracked lips.  Dry mouth.  Sunken eyes.  Sleepiness.  Weakness. This information is not intended to replace advice given to you by your health care provider. Make sure you discuss any questions you have with your health care provider. Document Released: 08/05/2007 Document Revised: 09/06/2015 Document Reviewed: 10/23/2014  2017 Elsevier  Salmonella Gastroenteritis, Adult Introduction Salmonella gastroenteritis is an infection of the intestines that can cause nausea, vomiting, and other symptoms. The infection usually lasts from 2 to 7 days. What are the causes? This condition is caused by salmonella bacteria. These bacteria can spread through a person's stool. You can get this infection by:  Eating food or drinking liquids that have the bacteria on it.  Drinking polluted, standing water.  Coming into contact with an animal that is carrying the bacteria. What increases the risk? This condition is more likely to develop in:  Elderly adults.  People with a weakened immune system.  People with poor personal or kitchen hygiene. What are the signs or symptoms? Symptoms of this condition include:  Nausea.  Vomiting.  Abdominal pain or cramps.  Diarrhea, which may be bloody.  Fever.  A headache. How is this diagnosed? This  condition may be diagnosed based on your medical history, a physical exam, and a blood or stool test. How is this treated? Often, the only treatment needed is to drink plenty of fluids. Drinking fluids is important because this infection can make you dehydrated. If your condition is severe, you may be prescribed an antibiotic medicine to help shorten the illness. Most people recover completely, but some people may develop lasting problems such as arthritis, irritation of the eyes, or painful urination. Follow these instructions at home: Drinking  Drink enough fluid to keep your urine clear or pale yellow. This helps prevent dehydration.  Until your diarrhea, nausea, or vomiting is under control, drink only clear liquids. Clear liquids are liquids you can see through, such as water, broth, or non-caffeinated tea. Avoid milk, fruit juice, alcohol, and very hot or very cold drinks.  If you are dehydrated, ask your health care provider for specific rehydration instructions. Signs of dehydration include:  Thirst.  Dry lips or mouth.  Dry, warm skin.  Dark urine.  A headache. Eating and drinking  If you are not hungry, do not force yourself to eat.  If you are hungry, eat  a well-balanced diet that includes:  A variety of complex carbohydrates, such as rice, wheat, potatoes, and bread.  Lean meats.  Yogurt.  Fruits.  Vegetables.  Avoid high-fat foods. They are difficult to digest.  If you have diarrhea, do not prepare food. Medicines  Take over-the-counter and prescription medicines only as told by your health care provider.  If you were prescribed an antibiotic medicine, take it as told by your health care provider. Do not stop taking the antibiotic even if you start to feel better. Other Instructions   Wash your hands well. This helps keep the bacteria from spreading to others.  Keep track of changes in your weight. Losing a lot of weight can be a sign of a serious  problem. Ask your health care provider how much weight loss should concern you.  Keep all follow-up visits as told by your health care provider. How is this prevented? To prevent future salmonella infections:  Handle meat, eggs, seafood, and poultry properly.  Always cook meat, eggs, seafood, and poultry thoroughly.  Wash your hands and counters thoroughly after handling or preparing meat, eggs, seafood, and poultry.  Wash your hands thoroughly after handling animals. Get help right away if:  You cannot keep fluids down.  You keep vomiting or having diarrhea.  You have abdominal pain that gets worse.  You have abdominal pain in one small area.  Your diarrhea has more blood or mucus than before.  You have a fever.  You have any symptoms of severe dehydration. These include:  Extreme thirst.  Confusion.  Inability to sweat.  Fainting.  Dizziness.  Weight loss. This information is not intended to replace advice given to you by your health care provider. Make sure you discuss any questions you have with your health care provider. Document Released: 02/14/2000 Document Revised: 07/25/2015 Document Reviewed: 11/10/2014  2017 Elsevier   Food Poisoning Introduction Food poisoning is an illness that is caused by eating or drinking contaminated foods or drinks. Food poisoning is usually mild and lasts 1-2 days. Foods and drinks can become contaminated because of:  Poor personal hygiene, such as not washing hands well enough or often.  Not storing food properly. For example, not refrigerating raw meat.  Serving, preparing, and storing food on surfaces that are not clean.  Cooking or eating with utensils that are not clean. The most common causes of this condition are:  Viruses.  Bacteria.  Parasites. Follow these instructions at home: Eating and drinking  Drink enough fluids to keep your pee (urine) clear or pale yellow. You may need to drink small amounts of  clear liquids often.  Avoid:  Milk.  Caffeine.  Alcohol.  Ask your doctor exactly how you should get enough fluid in your body (rehydrate).  Eat small meals often rather than eating large meals. Medicines  Take over-the-counter and prescription medicines only as told by your doctor. Ask your doctor if you should continue to take any of your regular prescribed and over-the-counter medicines.  If you were prescribed an antibiotic medicine, take it as told by your doctor. Do not stop taking the antibiotic even if you start to feel better. General instructions  Wash your hands fully before you prepare food and after you go to the bathroom (use the toilet). Make sure people who live with you also wash their hands often.  Clean surfaces that you touch with a product that contains chlorine bleach.  Keep all follow-up visits as told by your doctor. This is  important. How is this prevented?  Wash your hands, food preparation surfaces, and utensils before and after you handle raw foods.  Use separate food preparation surfaces and storage spaces for raw meat and for fruits and vegetables.  Keep refrigerated foods colder than 53F (5C).  Serve hot foods right away or keep them heated above 153F (60C).  Store dry foods in cool, dry spaces. Keep them away from too much heat or moisture.  Throw out any foods that:  Do not smell right.  Are in cans that are bulging.  Follow approved canning procedures.  Heat canned foods fully before you taste them.  Drink bottled or sterile water when you travel. Get help right away if: Call 911 or go to the emergency room if:  You have trouble:  Breathing.  Swallowing.  Talking.  Moving.  You have blurry vision.  You cannot eat or drink without throwing up (vomiting).  You pass out (faint).  Your eyes turn yellow.  You continue to throw up or have watery poop (diarrhea).  You start to have pain in your belly (abdomen), your  belly pain gets worse, or your belly pain stays in one small area.  You have a fever.  You have blood or mucus in your poop (stools), or your poop looks dark black and tarry.  You have signs of dehydration, such as:  Dark pee, very little pee, or no pee.  Cracked lips.  Not making tears while crying.  Dry mouth.  Sunken eyes.  Sleepiness.  Weakness.  Dizziness. This information is not intended to replace advice given to you by your health care provider. Make sure you discuss any questions you have with your health care provider. Document Released: 08/06/2009 Document Revised: 07/25/2015 Document Reviewed: 08/20/2014  2017 Elsevier   Preventing Foodborne Illness Foodborne illness, also called food poisoning, is an illness caused by eating food or drinking liquid that is contaminated with harmful bacteria, viruses, parasites, or poisons (toxins). Bacteria and viruses cause most foodborne illnesses. Common symptoms of foodborne illness are sudden nausea, vomiting, abdominal pain, diarrhea, and fever. Most foodborne illnesses go away in a few days without treatment. However, if you have a severe case, treatment may be needed. You can prevent foodborne illness by choosing to eat safe foods, preparing your food properly, and taking other precautions. What types of nutrition changes can I make?  Take extra care with foods that can spoil (perishable foods) like meat and dairy products. Germs can start to grow in these foods whether they are raw, cooked, or prepared. Make sure you:  Check labels to see if foods need to be refrigerated.  Freeze or refrigerate all perishable foods within two hours. In the summer, freeze or refrigerate perishable foods within one hour. Your refrigerator should be set to 40 degrees or colder.  Check the cooking instructions for all foods. Heating kills many germs and toxins. Use a meat thermometer to make sure you have cooked meats to the right  temperature to make them safe.  Pork should be cooked to no less than 145F (62.8C).  Whole cuts of beef, veal, and lamb should be cooked to no less than 145F (62.8C).  Ground meat should be cooked to no less than 160F (71.1C).  Poultry should be cooked to no less than 165F (73.9C).  Wash fruits and vegetables under running water before you eat, cut, or cook them. If you are preparing fruits and vegetables in a location where running water may not be  sanitary, use bottled water to clean fruit and vegetables or remove peels before eating.  Keep raw meats, poultry, fish, shellfish, and eggs separate from other foods. Germs or toxins can pass from one food to another.  Do not drink unpasteurized milk or juice. What other changes can I make?   Wash your hands for at least 20 seconds with soap and warm water before and after handling food. Always wash your hands after using the bathroom or changing a diaper.  Wash all the surfaces where food is prepared with hot, soapy water. Wash all utensils, plates, cutting boards, pots, and pans. Do this before and after food preparation.  If you travel to an area where foodborne illness is common:  Do not drink tap water, use ice from tap water, or brush your teeth with tap water. Use only bottled water.  Do not eat raw fruits or vegetables, unless you wash and peel them yourself.  Do not eat foods sold by a street vendor.  Do not eat rare meat, uncooked fish, or shellfish.  Talk to your health care provider about bringing medicine with you to treat possible foodborne illness (travelers diarrhea). Why should I make these changes? You should make these changes so that you and your family can avoid foodborne illness. Most foodborne illnesses pass in a few days, but some can be dangerous. People at higher risk for serious foodborne illness include children, older adults, pregnant women, and people with weak immune systems. What can happen if  changes are not made? Foodborne illnesses range in severity. They can cause unpleasant symptoms to dangerous complications. Complications can include dehydration and bleeding in your digestive system. Severe cases of foodborne illness can be deadly. What are my treatment options for foodborne illness? Most cases of foodborne illness can be treated at home. You should:  Drink enough fluid to keep your urine clear or pale yellow.  Avoid foods and drinks that are hard to digest or that irritate your digestive system. These include fatty foods, dairy foods, caffeine, sugary foods, and alcohol.  Gradually start eating foods that are easy on your digestive system. These include rice, potatoes, toast, applesauce, broth, and bananas. Severe cases of foodborne illness may need to be treated with IV fluids and other medicines. Antibiotic medicines may be given for severe illness caused by bacteria. Call your health care provider if you have:  Persistent vomiting and cannot keep any fluids down.  Diarrhea for more than two days.  Severe abdominal pain.  Bloody or tarry stools. Where can I get more information? Learn more about foodborne illness from:  Centers for Disease Control and Prevention: [employer]Homes.se.[place] [facility]: HuntLaws.ca.aspx  RunningShows.co.za: https://www.http://www.monroe-smith.com/ Summary  Foodborne illness is caused by bacteria, viruses, parasites, or toxins.  Symptoms include sudden nausea, vomiting, abdominal pain, diarrhea, and fever.  You can prevent foodborne illness by carefully preparing, storing, and cooking your food.  Make sure to wash your hands and all food preparation surfaces and utensils before and after handling food.  Avoid tap water and uncooked foods when traveling to areas where foodborne illness is  common.  Call your health care provider if you have severe symptoms of foodborne illness, or if you have common symptoms that last more than a few days. This information is not intended to replace advice given to you by your health care provider. Make sure you discuss any questions you have with your health care provider. Document Released: 06/10/2015 Document Revised: 10/30/2015 Document Reviewed:  02/08/2015 Elsevier Interactive Patient Education  2017 [employer].

## 2016-02-10 NOTE — Discharge Summary (Signed)
Physician Discharge Summary  Tina Flores [id number] DOB: 26-Sep-1975 DOA: 02/06/2016  PCP: Wenda Low, MD  Admit date: 02/06/2016 Discharge date: 02/10/2016  Admitted From: Home  Disposition:  Home   Recommendations for Outpatient Follow-up:  1. Follow up with PCP in 1 weeks 2. 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 Symptoms started about 3 hours after eating some left over [place] food. She says that she was the only one in the house that ate that leftover [place] food and she has been the only one sick. She has had 3 ED visits in last week and supportive care has not stopped the diarrhea. She has bowel movements approximately every 2 hours. Patient had associated fever at onset however has not had a fever in 2 days. She has had nausea. She had vomiting but this has been controlled with Zofran given her previous visit. She continues to drink fluids. She is currently taking Imodium and potassium supplementation. No known sick contacts. Only travel was to the [place] in October. The onset of this condition was acute. The course is constant. Aggravating factors: none. Alleviating factors: none.   Assessment & Plan:    1. Acute Infectious Gastroenteritis - secondary to salmonella.  Symptoms occurred after leftover [place] food consumption but has persisted for nearly 1 week. Had severe diarrhea even after starting cipro, could not stay hydrated without ongoing IV fluids. Now diarrhea has slowed down and improving. Was given cholestyramine which has helped some. [facility]  following.  Pt to complete 7 days total of ciprofloxacin - needs 3 more days after discharge.  2. Severe dehydration - Improved with IVF hydration.  Tolerating regular diet now.      3. Hypokalemia - improved with IV and oral replacement, likely has total body depletion, received Magnesium IV. Give oral dose 12/10. Monitored on telemetry.  4. Lower GI bleeding - Suspect from infectious diarrhea, Hg stable. Heme test was positive, suspect this is related to acute infection, repeat CBC with PCP on outpatient follow up. GI consult appreciated. Held heparins and used SCDs for DVT prophylaxis. 5. Type 2 Diabetes Mellitus - sensitive sliding scale coverage ordered, carb modified diet. BS have been stable. Her diabetes has been diet controlled outpatient.  6. OSA - nightly CPAP ordered but patient declined to use.   7. Acute Renal Failure - Resolved now.  Creatinine normal.  Secondary to dehydration, hold nephrotoxic agents, Hydrate. Improving with IVF hydration.  8. Essential Hypertension - Initially held home Lotrel secondary to ARF but can resume at discharge as renal function improved to normal.  9. Moderate Persistent Asthma - compensated as long as she takes her daily medications which will resume in hospital.   DVT Prophylaxis:SCDs Code Status:FULL Family Communication:bedside Disposition Plan:Likely can discharge home 12/11 with close outpatient follow up.   Consultants:  GI Discharge Diagnoses:  Principal Problem:   Acute renal failure ([place]) Active Problems:   Acute infective gastroenteritis   Salmonella gastroenteritis   Hypokalemia   Acute gastroenteritis   Dehydration   Diarrhea   HTN (hypertension)   HSV-2 infection   HSV-1 infection   Type 2 diabetes mellitus ([place])  Asthma, moderate persistent   Allergic rhinitis   Hyperlipidemia   Sleep apnea   Obesity, Class III, BMI 40-49.9 (morbid obesity) (HCC)   BRBPR (bright red blood per rectum)   Hematest  positive stools    Discharge Instructions  Discharge Instructions    AMB Referral to [facility]    Complete by:  As directed    Please assign [facility] member for post discharge call. Also needs LTW packet and LTW appointment for DM, HTN, asthma management. Likely discharge over the weekend from [facility]. Thanks. Marthenia Rolling, MSN-Ed, RN,BSN [facility] [phone]   Reason for consult:  Please assign UMR member for post discharge call   Diagnoses of:  Diabetes   Expected date of contact:  1-3 days (reserved for hospital discharges)   Activity as tolerated - No restrictions    Complete by:  As directed        Medication List    STOP taking these medications   loperamide 1 MG/5ML solution Commonly known as:  IMODIUM   ondansetron 4 MG tablet Commonly known as:  ZOFRAN     TAKE these medications   albuterol 108 (90 Base) MCG/ACT inhaler Commonly known as:  PROVENTIL HFA;VENTOLIN HFA Inhale 1-2 puffs into the lungs every 6 (six) hours as needed for wheezing or shortness of breath.   amLODipine-benazepril 5-20 MG capsule Commonly known as:  LOTREL Take 1 capsule by mouth daily.   budesonide-formoterol 160-4.5 MCG/ACT inhaler Commonly known as:  SYMBICORT Inhale 2 puffs into the lungs 2 (two) times daily.   cholestyramine light 4 g packet Commonly known as:  PREVALITE Take 1 packet (4 g total) by mouth 3 (three) times daily before meals.   ciprofloxacin 500 MG tablet Commonly known as:  CIPRO Take 1 tablet (500 mg total) by mouth 2 (two) times daily.   potassium chloride SA 20 MEQ tablet Commonly known as:  K-DUR,KLOR-CON Take 20 mEq by mouth daily.   spironolactone 50 MG tablet Commonly known as:  ALDACTONE Take 50 mg by mouth daily.   valACYclovir 500 MG tablet Commonly known as:  VALTREX Take 500 mg by mouth daily as needed (for outbreaks).      Follow-up Information    HUSAIN,KARRAR, MD. Schedule an appointment as soon as  possible for a visit in 1 week(s).   Specialty:  Internal Medicine Why:  Hospital Follow Up  Contact information: [address] [place] [address] [phone]          No Known Allergies  Procedures/Studies: Dg Chest 2 View  Result Date: 02/01/2016 CLINICAL DATA:  Chills, body aches, cough, congestion, n/v x 24 hrs. No chest pain. Non smoker. Htn. No diab. No hx mi or stroke. EXAM: CHEST  2 VIEW COMPARISON:  12/04/2013 FINDINGS: The heart size and mediastinal contours are within normal limits. Both lungs are clear. No pleural effusion or pneumothorax. The visualized skeletal structures are unremarkable. IMPRESSION: No active cardiopulmonary disease. Electronically Signed   By: Lajean Manes M.D.   On: 02/01/2016 18:09   US Breast Ltd Uni Right Inc Axilla  Result Date: 01/14/2016 CLINICAL DATA:  Six-month follow-up for probably benign right breast mass which was initially found on patient's baseline screening mammogram of 07/05/2015. EXAM: ULTRASOUND OF THE RIGHT BREAST COMPARISON:  Previous exam(s). FINDINGS: Targeted ultrasound is performed, showing an oval circumscribed hypoechoic mass in the right breast at the 9 o'clock axis, retroareolar, measuring 0.7 x 0.3 x 0.7 cm, stable, again  most suggestive of benign fibroadenoma. 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 Recommend additional follow-up diagnostic mammogram and possible ultrasound in 6 months to ensure continued stability. This will be performed as a bilateral diagnostic mammogram in conjunction with patient's routine left breast screening mammogram schedule. RECOMMENDATION: Bilateral diagnostic mammogram and possible right breast ultrasound in 6 months. I have discussed the findings and recommendations with the patient. Results were also provided in writing at the conclusion of the visit. If  applicable, a reminder letter will be sent to the patient regarding the next appointment. BI-RADS CATEGORY  3: Probably benign. Electronically Signed   By: Franki Cabot M.D.   On: 01/14/2016 15:11     Subjective: Pt able to ambulate and tolerating reg diet and less diarrhea.   Discharge Exam: Vitals:   02/09/16 1518 02/09/16 2032  BP: 137/88 119/73  Pulse: 73 63  Resp: 18 18  Temp: 97.9 F (36.6 C) 97.5 F (36.4 C)   Vitals:   02/08/16 1900 02/09/16 0502 02/09/16 1518 02/09/16 2032  BP: 110/78 (!) 104/57 137/88 119/73  Pulse: 63 63 73 63  Resp: 15  18 18   Temp: 97.8 F (36.6 C) 97.8 F (36.6 C) 97.9 F (36.6 C) 97.5 F (36.4 C)  TempSrc: Oral Oral Oral Oral  SpO2: 100% 100% 100% 100%  Weight: 100 kg (220 lb 7.4 oz)   104 kg (229 lb 4.5 oz)  Height:        General: Pt is alert, awake, not in acute distress Cardiovascular: RRR, S1/S2 +, no rubs, no gallops Respiratory: CTA bilaterally, no wheezing, no rhonchi Abdominal: Soft, NT, ND, bowel sounds + Extremities: no cyanosis  The results of significant diagnostics from this hospitalization (including imaging, microbiology, ancillary and laboratory) are listed below for reference.     Microbiology: Recent Results (from the past 240 hour(s))  Gastrointestinal Panel by PCR , Stool     Status: Abnormal   Collection Time: 02/06/16 10:10 AM  Result Value Ref Range Status   Campylobacter species NOT DETECTED NOT DETECTED Final   Plesimonas shigelloides NOT DETECTED NOT DETECTED Final   Salmonella species DETECTED (A) NOT DETECTED Final    Comment: RESULT CALLED TO, READ BACK BY AND VERIFIED WITH: AMY HARTLEY 02/07/16 1431 SGD    Yersinia enterocolitica NOT DETECTED NOT DETECTED Final   Vibrio species NOT DETECTED NOT DETECTED Final   Vibrio cholerae NOT DETECTED NOT DETECTED Final   Enteroaggregative E coli (EAEC) NOT DETECTED NOT DETECTED Final   Enteropathogenic E coli (EPEC) NOT DETECTED NOT DETECTED Final    Enterotoxigenic E coli (ETEC) NOT DETECTED NOT DETECTED Final   Shiga like toxin producing E coli (STEC) NOT DETECTED NOT DETECTED Final   Shigella/Enteroinvasive E coli (EIEC) NOT DETECTED NOT DETECTED Final   Cryptosporidium NOT DETECTED NOT DETECTED Final   Cyclospora cayetanensis NOT DETECTED NOT DETECTED Final   Entamoeba histolytica NOT DETECTED NOT DETECTED Final   Giardia lamblia NOT DETECTED NOT DETECTED Final   Adenovirus F40/41 NOT DETECTED NOT DETECTED Final   Astrovirus NOT DETECTED NOT DETECTED Final   Norovirus GI/GII NOT DETECTED NOT DETECTED Final   Rotavirus A NOT DETECTED NOT DETECTED Final   Sapovirus (I, II, IV, and V) NOT DETECTED NOT DETECTED Final  C difficile quick scan w PCR reflex     Status: None   Collection Time: 02/06/16 10:10 AM  Result Value Ref Range Status   C Diff antigen NEGATIVE NEGATIVE Final   C Diff toxin NEGATIVE NEGATIVE Final   C Diff interpretation No C. difficile detected.  Final    Comment: Performed at [place]: BNP (last 3 results) No results for input(s): BNP in the last 8760 hours. Basic Metabolic Panel:  Recent Labs Lab 02/04/16 1149 02/06/16 1005 02/06/16 1905 02/07/16 0534 02/08/16 0546 02/09/16 0546  NA 134* 135  --  138 141 138  K 2.9* 2.7*  --  3.4* 3.7 3.4*  CL 105 103  --  112* 116* 113*  CO2 17* 19*  --  16* 20* 18*  GLUCOSE 166* 141*  --  143* 117* 112*  BUN 24* 23*  --  16 12 10   CREATININE 1.51* 1.71* 1.17* 1.05* 1.02* 0.97  CALCIUM 9.1 9.2  --  8.2* 8.5* 8.3*  MG  --   --  2.1 2.5* 2.1  --   PHOS  --   --  2.1*  --   --   --    Liver Function Tests:  Recent Labs Lab 02/04/16 1149 02/06/16 1005  AST 25 26  ALT 24 22  ALKPHOS 115 103  BILITOT 0.4 0.4  PROT 8.9* 8.8*  ALBUMIN 4.4 4.0    Recent Labs Lab 02/04/16 1149  LIPASE 33   No results for input(s): AMMONIA in the last 168 hours. CBC:  Recent Labs Lab 02/04/16 1149 02/06/16 1005 02/06/16 1905 02/07/16 0534  02/08/16 0546 02/09/16 0546  WBC 5.1 4.8 3.9* 4.2 4.9 4.8  NEUTROABS 3.5 2.4  --   --   --   --   HGB 13.4 13.6 12.6 11.9* 11.1* 11.4*  HCT 40.0 39.2 37.1 36.0 33.9* 32.6*  MCV 78.9 76.7* 77.8* 79.1 79.2 75.8*  PLT 301 354 338 304 316 317   Cardiac Enzymes: No results for input(s): CKTOTAL, CKMB, CKMBINDEX, TROPONINI in the last 168 hours. BNP: Invalid input(s): POCBNP CBG:  Recent Labs Lab 02/09/16 0731 02/09/16 1132 02/09/16 1610 02/09/16 2030 02/10/16 0826  GLUCAP 112* 115* 117* 124* 105*   D-Dimer No results for input(s): DDIMER in the last 72 hours. Hgb A1c No results for input(s): HGBA1C in the last 72 hours. Lipid Profile No results for input(s): CHOL, HDL, LDLCALC, TRIG, CHOLHDL, LDLDIRECT in the last 72 hours. Thyroid function studies No results for input(s): TSH, T4TOTAL, T3FREE, THYROIDAB in the last 72 hours.  Invalid input(s): FREET3 Anemia work up No results for input(s): VITAMINB12, FOLATE, FERRITIN, TIBC, IRON, RETICCTPCT in the last 72 hours. Urinalysis    Component Value Date/Time   COLORURINE AMBER (A) 02/04/2016 1125   APPEARANCEUR CLOUDY (A) 02/04/2016 1125   LABSPEC 1.021 02/04/2016 1125   PHURINE 6.0 02/04/2016 1125   GLUCOSEU NEGATIVE 02/04/2016 1125   GLUCOSEU >=1000 (A) 01/11/2014 1007   HGBUR NEGATIVE 02/04/2016 1125   BILIRUBINUR NEGATIVE 02/04/2016 1125   BILIRUBINUR neg 07/21/2011 1638   KETONESUR NEGATIVE 02/04/2016 1125   PROTEINUR 100 (A) 02/04/2016 1125   UROBILINOGEN 0.2 01/11/2014 1007   NITRITE NEGATIVE 02/04/2016 1125   LEUKOCYTESUR NEGATIVE 02/04/2016 1125   Sepsis Labs Invalid input(s): PROCALCITONIN,  WBC,  LACTICIDVEN Microbiology Recent Results (from the past 240 hour(s))  Gastrointestinal Panel by PCR , Stool     Status: Abnormal   Collection Time: 02/06/16 10:10 AM  Result Value Ref Range Status   Campylobacter species NOT DETECTED NOT DETECTED Final   Plesimonas shigelloides NOT  DETECTED NOT DETECTED Final    Salmonella species DETECTED (A) NOT DETECTED Final    Comment: RESULT CALLED TO, READ BACK BY AND VERIFIED WITH: AMY HARTLEY 02/07/16 1431 SGD    Yersinia enterocolitica NOT DETECTED NOT DETECTED Final   Vibrio species NOT DETECTED NOT DETECTED Final   Vibrio cholerae NOT DETECTED NOT DETECTED Final   Enteroaggregative E coli (EAEC) NOT DETECTED NOT DETECTED Final   Enteropathogenic E coli (EPEC) NOT DETECTED NOT DETECTED Final   Enterotoxigenic E coli (ETEC) NOT DETECTED NOT DETECTED Final   Shiga like toxin producing E coli (STEC) NOT DETECTED NOT DETECTED Final   Shigella/Enteroinvasive E coli (EIEC) NOT DETECTED NOT DETECTED Final   Cryptosporidium NOT DETECTED NOT DETECTED Final   Cyclospora cayetanensis NOT DETECTED NOT DETECTED Final   Entamoeba histolytica NOT DETECTED NOT DETECTED Final   Giardia lamblia NOT DETECTED NOT DETECTED Final   Adenovirus F40/41 NOT DETECTED NOT DETECTED Final   Astrovirus NOT DETECTED NOT DETECTED Final   Norovirus GI/GII NOT DETECTED NOT DETECTED Final   Rotavirus A NOT DETECTED NOT DETECTED Final   Sapovirus (I, II, IV, and V) NOT DETECTED NOT DETECTED Final  C difficile quick scan w PCR reflex     Status: None   Collection Time: 02/06/16 10:10 AM  Result Value Ref Range Status   C Diff antigen NEGATIVE NEGATIVE Final   C Diff toxin NEGATIVE NEGATIVE Final   C Diff interpretation No C. difficile detected.  Final    Comment: Performed at [facility]   Time coordinating discharge: 33 minutes  SIGNED:  Irwin Brakeman, MD  [facility] 02/10/2016, 11:12 AM Pager   If 7PM-7AM, please contact night-coverage [email or web address] Password [email or web address]

## 2016-02-11 DIAGNOSIS — I10 Essential (primary) hypertension: Secondary | ICD-10-CM | POA: Diagnosis not present

## 2016-02-11 DIAGNOSIS — A020 Salmonella enteritis: Secondary | ICD-10-CM | POA: Diagnosis not present

## 2016-02-13 ENCOUNTER — Other Ambulatory Visit: Payer: Self-pay | Admitting: *Deleted

## 2016-02-13 NOTE — Patient Outreach (Signed)
First attempt made to contact pt, follow up on referral from [facility] for transition of care- recent hospitalization 12/7-12/11 for acute infectious gastroenteritis secondary to salmonella.   HIPAA compliant voice message left with contact name and number.    Plan: If no response, plan to follow up again telephonically next business day.    Zara Chess.   [facility]  [phone]

## 2016-02-14 ENCOUNTER — Other Ambulatory Visit: Payer: Self-pay | Admitting: *Deleted

## 2016-02-14 NOTE — Patient Outreach (Addendum)
Transition of care call completed, follow up on referral from [place] liaison for transition of care.  Spoke with U[facility] member, HIPAA/identity verified.   [facility] member reports doing good since discharge, no c/o pain, no issues eating.  [facility] member  reports follow up with Dr. Deforest Hoyles 12/13, to see MD again next week, Lotrel and Spironolactone on hold until then.  RN CM discussed with [facility]MR member LTW packet, assistance with Diabetes to which she wanted to think about that, talk to Primary Care MD to make sure it is an accurate diagnosis.   Patient was recently discharged from hospital and all medications have been reviewed.  Plan:  RN CM to close case as no further case management needs, UMR member following up with MD.           RN CM to send successful outreach letter.     Zara Chess.   [facility]  [phone]        Late entry- 12/22, episode resolved.    Zara Chess.   [facility]  [phone]

## 2016-02-16 ENCOUNTER — Encounter: Payer: Self-pay | Admitting: *Deleted

## 2016-02-18 DIAGNOSIS — A020 Salmonella enteritis: Secondary | ICD-10-CM | POA: Diagnosis not present

## 2016-02-18 DIAGNOSIS — I10 Essential (primary) hypertension: Secondary | ICD-10-CM | POA: Diagnosis not present

## 2016-02-18 DIAGNOSIS — N179 Acute kidney failure, unspecified: Secondary | ICD-10-CM | POA: Diagnosis not present

## 2016-02-19 DIAGNOSIS — I10 Essential (primary) hypertension: Secondary | ICD-10-CM | POA: Diagnosis not present

## 2016-03-10 MED FILL — SPIRONOLACTONE 50 MG TABLET: 50 | 90 days supply | Qty: 135 | Fill #2

## 2016-03-10 MED FILL — VENTOLIN HFA 90 MCG INHALER: 108 (90 BAS | 32 days supply | Qty: 36 | Fill #3

## 2016-03-10 MED FILL — AMLODIPINE-BENAZEPRIL 5-20: 5-20 | 90 days supply | Qty: 90 | Fill #2

## 2016-03-26 MED FILL — SYMBICORT 160-4.5 MCG INH: 160-4.5 | 30 days supply | Qty: 10 | Fill #0

## 2016-04-28 DIAGNOSIS — K912 Postsurgical malabsorption, not elsewhere classified: Secondary | ICD-10-CM | POA: Diagnosis not present

## 2016-04-28 DIAGNOSIS — Z9884 Bariatric surgery status: Secondary | ICD-10-CM | POA: Diagnosis not present

## 2016-04-28 DIAGNOSIS — Z5181 Encounter for therapeutic drug level monitoring: Secondary | ICD-10-CM | POA: Diagnosis not present

## 2016-05-25 MED FILL — VENTOLIN HFA 90 MCG INHALER: 108 (90 BAS | 32 days supply | Qty: 36 | Fill #4

## 2016-05-25 MED FILL — SYMBICORT 160-4.5 MCG INH: 160-4.5 | 30 days supply | Qty: 10 | Fill #1

## 2016-07-10 MED FILL — CEPHALEXIN 500 MG CAPSULE: 500 | 5 days supply | Qty: 10 | Fill #0

## 2016-07-10 MED FILL — AMLODIPINE-BENAZEPRIL 5-20: 5-20 | 90 days supply | Qty: 90 | Fill #3

## 2016-07-10 MED FILL — HYDROCODON-APAP 10-325: 10-325 | 3 days supply | Qty: 30 | Fill #0

## 2016-07-16 MED FILL — FLUCONAZOLE 150 MG TABLET: 150 | 1 days supply | Qty: 1 | Fill #1

## 2016-08-28 ENCOUNTER — Other Ambulatory Visit: Payer: Self-pay | Admitting: Internal Medicine

## 2016-08-28 DIAGNOSIS — N63 Unspecified lump in unspecified breast: Secondary | ICD-10-CM

## 2016-09-01 MED FILL — VENTOLIN HFA 90 MCG INHALER: 108 (90 BAS | 32 days supply | Qty: 36 | Fill #5

## 2016-09-01 MED FILL — SYMBICORT 160-4.5 MCG INH: 160-4.5 | 30 days supply | Qty: 10 | Fill #2

## 2016-09-29 ENCOUNTER — Ambulatory Visit
Admission: RE | Admit: 2016-09-29 | Discharge: 2016-09-29 | Disposition: A | Discharge status: Home / Self Care | Payer: 59 | Source: Ambulatory Visit | Attending: Internal Medicine | Admitting: Internal Medicine

## 2016-09-29 DIAGNOSIS — N63 Unspecified lump in unspecified breast: Secondary | ICD-10-CM

## 2016-09-29 DIAGNOSIS — N6310 Unspecified lump in the right breast, unspecified quadrant: Secondary | ICD-10-CM | POA: Diagnosis not present

## 2016-09-29 DIAGNOSIS — R928 Other abnormal and inconclusive findings on diagnostic imaging of breast: Secondary | ICD-10-CM | POA: Diagnosis not present

## 2016-11-03 MED FILL — AMLODIPINE-BENAZEPRIL 5-20: 5-20 | 90 days supply | Qty: 90 | Fill #0

## 2016-11-03 MED FILL — SPIRONOLACTONE 50 MG TABLET: 50 | 90 days supply | Qty: 135 | Fill #0

## 2016-11-03 MED FILL — VALACYCLOVIR HCL 500 MG TAB: 500 | 30 days supply | Qty: 30 | Fill #1

## 2016-11-05 MED FILL — XIIDRA 5% EYE DROPS: 5 | 30 days supply | Qty: 60 | Fill #0

## 2016-12-07 DIAGNOSIS — J309 Allergic rhinitis, unspecified: Secondary | ICD-10-CM | POA: Diagnosis not present

## 2016-12-07 DIAGNOSIS — E119 Type 2 diabetes mellitus without complications: Secondary | ICD-10-CM | POA: Diagnosis not present

## 2016-12-07 DIAGNOSIS — J45909 Unspecified asthma, uncomplicated: Secondary | ICD-10-CM | POA: Diagnosis not present

## 2016-12-07 DIAGNOSIS — E7800 Pure hypercholesterolemia, unspecified: Secondary | ICD-10-CM | POA: Diagnosis not present

## 2016-12-07 DIAGNOSIS — Z0000 Encounter for general adult medical examination without abnormal findings: Secondary | ICD-10-CM | POA: Diagnosis not present

## 2016-12-07 DIAGNOSIS — I10 Essential (primary) hypertension: Secondary | ICD-10-CM | POA: Diagnosis not present

## 2016-12-07 DIAGNOSIS — Z1389 Encounter for screening for other disorder: Secondary | ICD-10-CM | POA: Diagnosis not present

## 2016-12-07 MED FILL — VENTOLIN HFA 90 MCG INHALER: 108 (90 BAS | 34 days supply | Qty: 36 | Fill #0

## 2016-12-09 MED FILL — VALACYCLOVIR HCL 500 MG TAB: 500 | 30 days supply | Qty: 30 | Fill #2

## 2016-12-09 MED FILL — FLUCONAZOLE 150 MG TABLET: 150 | 1 days supply | Qty: 1 | Fill #2

## 2017-01-25 DIAGNOSIS — Z01411 Encounter for gynecological examination (general) (routine) with abnormal findings: Secondary | ICD-10-CM | POA: Diagnosis not present

## 2017-01-25 DIAGNOSIS — B009 Herpesviral infection, unspecified: Secondary | ICD-10-CM | POA: Diagnosis not present

## 2017-01-25 DIAGNOSIS — Z6834 Body mass index (BMI) 34.0-34.9, adult: Secondary | ICD-10-CM | POA: Diagnosis not present

## 2017-01-25 DIAGNOSIS — Z124 Encounter for screening for malignant neoplasm of cervix: Secondary | ICD-10-CM | POA: Diagnosis not present

## 2017-02-24 MED FILL — AMLODIPINE-BENAZEPRIL 5-20: 5-20 | 30 days supply | Qty: 30 | Fill #1

## 2017-03-19 MED FILL — VENTOLIN HFA 90 MCG INHALER: 108 (90 BAS | 34 days supply | Qty: 36 | Fill #1

## 2017-03-19 MED FILL — SYMBICORT 160-4.5 MCG INH: 160-4.5 | 30 days supply | Qty: 10 | Fill #0

## 2017-03-20 MED FILL — AMLODIPINE-BENAZEPRIL 5-20: 5-20 | 90 days supply | Qty: 90 | Fill #2

## 2017-04-22 DIAGNOSIS — E119 Type 2 diabetes mellitus without complications: Secondary | ICD-10-CM | POA: Diagnosis not present

## 2017-04-22 DIAGNOSIS — I10 Essential (primary) hypertension: Secondary | ICD-10-CM | POA: Diagnosis not present

## 2017-04-22 DIAGNOSIS — R42 Dizziness and giddiness: Secondary | ICD-10-CM | POA: Diagnosis not present

## 2017-05-25 MED FILL — VENTOLIN HFA 90 MCG INHALER: 108 (90 BAS | 34 days supply | Qty: 36 | Fill #2

## 2017-05-25 MED FILL — SYMBICORT 160-4.5 MCG INH: 160-4.5 | 30 days supply | Qty: 10 | Fill #1

## 2017-06-07 DIAGNOSIS — E7800 Pure hypercholesterolemia, unspecified: Secondary | ICD-10-CM | POA: Diagnosis not present

## 2017-06-07 DIAGNOSIS — E1169 Type 2 diabetes mellitus with other specified complication: Secondary | ICD-10-CM | POA: Diagnosis not present

## 2017-06-07 DIAGNOSIS — I10 Essential (primary) hypertension: Secondary | ICD-10-CM | POA: Diagnosis not present

## 2017-06-07 DIAGNOSIS — R252 Cramp and spasm: Secondary | ICD-10-CM | POA: Diagnosis not present

## 2017-06-07 DIAGNOSIS — J45909 Unspecified asthma, uncomplicated: Secondary | ICD-10-CM | POA: Diagnosis not present

## 2017-07-01 MED FILL — AMLODIPINE-BENAZEPRIL 5-20: 5-20 | 90 days supply | Qty: 90 | Fill #0

## 2017-07-01 MED FILL — VENTOLIN HFA 90 MCG INHALER: 108 (90 BAS | 34 days supply | Qty: 36 | Fill #3

## 2017-07-01 MED FILL — SYMBICORT 160-4.5 MCG INH: 160-4.5 | 30 days supply | Qty: 10 | Fill #2

## 2017-08-16 MED FILL — SYMBICORT 160-4.5 MCG INH: 160-4.5 | 30 days supply | Qty: 10 | Fill #3

## 2017-08-16 MED FILL — VENTOLIN HFA 90 MCG INHALER: 108 (90 BAS | 34 days supply | Qty: 36 | Fill #4

## 2017-08-26 DIAGNOSIS — M7989 Other specified soft tissue disorders: Secondary | ICD-10-CM | POA: Diagnosis not present

## 2017-08-26 DIAGNOSIS — I10 Essential (primary) hypertension: Secondary | ICD-10-CM | POA: Diagnosis not present

## 2017-08-26 MED FILL — HYDROCHLOROTHIAZIDE 12.5 MG: 12.5 | 30 days supply | Qty: 30 | Fill #0

## 2017-10-06 DIAGNOSIS — I10 Essential (primary) hypertension: Secondary | ICD-10-CM | POA: Diagnosis not present

## 2017-10-06 DIAGNOSIS — M2550 Pain in unspecified joint: Secondary | ICD-10-CM | POA: Diagnosis not present

## 2017-10-06 MED FILL — HYDROCHLOROTHIAZIDE 12.5 MG: 12.5 | 90 days supply | Qty: 90 | Fill #0

## 2017-10-06 MED FILL — VENTOLIN HFA 90 MCG INHALER: 108 (90 BAS | 34 days supply | Qty: 36 | Fill #5

## 2017-10-06 MED FILL — SYMBICORT 160-4.5 MCG INH: 160-4.5 | 90 days supply | Qty: 31 | Fill #4

## 2017-12-13 ENCOUNTER — Other Ambulatory Visit: Payer: Self-pay | Admitting: Internal Medicine

## 2017-12-13 DIAGNOSIS — N6310 Unspecified lump in the right breast, unspecified quadrant: Secondary | ICD-10-CM

## 2017-12-21 ENCOUNTER — Ambulatory Visit
Admission: RE | Admit: 2017-12-21 | Discharge: 2017-12-21 | Disposition: A | Discharge status: Home / Self Care | Payer: 59 | Source: Ambulatory Visit | Attending: Internal Medicine | Admitting: Internal Medicine

## 2017-12-21 DIAGNOSIS — N6489 Other specified disorders of breast: Secondary | ICD-10-CM | POA: Diagnosis not present

## 2017-12-21 DIAGNOSIS — R920 Mammographic microcalcification found on diagnostic imaging of breast: Secondary | ICD-10-CM | POA: Diagnosis not present

## 2017-12-21 DIAGNOSIS — N6310 Unspecified lump in the right breast, unspecified quadrant: Secondary | ICD-10-CM

## 2017-12-29 MED FILL — HYDROCHLOROTHIAZIDE 12.5 MG: 12.5 | 90 days supply | Qty: 90 | Fill #1

## 2017-12-29 MED FILL — SYMBICORT 160-4.5 MCG INH: 160-4.5 | 90 days supply | Qty: 31 | Fill #0

## 2017-12-29 MED FILL — VENTOLIN HFA 90 MCG INHALER: 108 (90 BAS | 84 days supply | Qty: 54 | Fill #0

## 2018-01-25 DIAGNOSIS — Z6836 Body mass index (BMI) 36.0-36.9, adult: Secondary | ICD-10-CM | POA: Diagnosis not present

## 2018-01-25 DIAGNOSIS — Z01411 Encounter for gynecological examination (general) (routine) with abnormal findings: Secondary | ICD-10-CM | POA: Diagnosis not present

## 2018-01-25 DIAGNOSIS — Z113 Encounter for screening for infections with a predominantly sexual mode of transmission: Secondary | ICD-10-CM | POA: Diagnosis not present

## 2018-01-25 DIAGNOSIS — Z124 Encounter for screening for malignant neoplasm of cervix: Secondary | ICD-10-CM | POA: Diagnosis not present

## 2018-02-07 DIAGNOSIS — H9190 Unspecified hearing loss, unspecified ear: Secondary | ICD-10-CM | POA: Diagnosis not present

## 2018-02-07 DIAGNOSIS — J45909 Unspecified asthma, uncomplicated: Secondary | ICD-10-CM | POA: Diagnosis not present

## 2018-02-07 DIAGNOSIS — Z1389 Encounter for screening for other disorder: Secondary | ICD-10-CM | POA: Diagnosis not present

## 2018-02-07 DIAGNOSIS — E1169 Type 2 diabetes mellitus with other specified complication: Secondary | ICD-10-CM | POA: Diagnosis not present

## 2018-02-07 DIAGNOSIS — E7800 Pure hypercholesterolemia, unspecified: Secondary | ICD-10-CM | POA: Diagnosis not present

## 2018-02-07 DIAGNOSIS — I10 Essential (primary) hypertension: Secondary | ICD-10-CM | POA: Diagnosis not present

## 2018-02-07 DIAGNOSIS — Z0000 Encounter for general adult medical examination without abnormal findings: Secondary | ICD-10-CM | POA: Diagnosis not present

## 2018-03-11 DIAGNOSIS — H9042 Sensorineural hearing loss, unilateral, left ear, with unrestricted hearing on the contralateral side: Secondary | ICD-10-CM | POA: Diagnosis not present

## 2018-04-22 MED FILL — SM BLOOD PRESSURE MONITOR: 30 days supply | Qty: 1 | Fill #0

## 2018-04-22 MED FILL — HYDROCHLOROTHIAZIDE 12.5 MG: 12.5 | 90 days supply | Qty: 90 | Fill #0

## 2018-04-22 MED FILL — SYMBICORT 160-4.5 MCG INH: 160-4.5 | 30 days supply | Qty: 10 | Fill #0

## 2018-04-22 MED FILL — VENTOLIN HFA 90 MCG INHALER: 108 (90 BAS | 16 days supply | Qty: 18 | Fill #0

## 2018-05-07 DIAGNOSIS — R109 Unspecified abdominal pain: Secondary | ICD-10-CM | POA: Diagnosis not present

## 2018-05-25 MED FILL — ALBUTEROL SULFATE HFA 108 (: 108 (90 BAS | 16 days supply | Qty: 18 | Fill #1

## 2018-05-25 MED FILL — SYMBICORT 160-4.5 MCG INH: 160-4.5 | 30 days supply | Qty: 10 | Fill #1

## 2018-05-31 ENCOUNTER — Other Ambulatory Visit: Payer: Self-pay | Admitting: Internal Medicine

## 2018-05-31 DIAGNOSIS — M549 Dorsalgia, unspecified: Secondary | ICD-10-CM | POA: Diagnosis not present

## 2018-05-31 DIAGNOSIS — R109 Unspecified abdominal pain: Secondary | ICD-10-CM | POA: Diagnosis not present

## 2018-05-31 DIAGNOSIS — M545 Low back pain, unspecified: Secondary | ICD-10-CM

## 2018-06-02 ENCOUNTER — Other Ambulatory Visit: Payer: Self-pay

## 2018-06-02 ENCOUNTER — Ambulatory Visit
Admission: RE | Admit: 2018-06-02 | Discharge: 2018-06-02 | Disposition: A | Discharge status: Home / Self Care | Payer: 59 | Source: Ambulatory Visit | Attending: Internal Medicine | Admitting: Internal Medicine

## 2018-06-02 DIAGNOSIS — R109 Unspecified abdominal pain: Secondary | ICD-10-CM | POA: Diagnosis not present

## 2018-06-02 DIAGNOSIS — M545 Low back pain, unspecified: Secondary | ICD-10-CM

## 2018-06-15 MED FILL — VALACYCLOVIR HCL 500 MG TAB: 500 | 30 days supply | Qty: 30 | Fill #0

## 2018-06-20 MED FILL — SYMBICORT 160-4.5 MCG INH: 160-4.5 | 30 days supply | Qty: 10 | Fill #2

## 2018-06-20 MED FILL — VENTOLIN HFA 90 MCG INHALER: 108 (90 BAS | 16 days supply | Qty: 18 | Fill #2

## 2018-07-15 MED FILL — SYMBICORT 160-4.5 MCG INH: 160-4.5 | 30 days supply | Qty: 10 | Fill #3

## 2018-07-15 MED FILL — VENTOLIN HFA 90 MCG INHALER: 108 (90 BAS | 16 days supply | Qty: 18 | Fill #3

## 2018-07-15 MED FILL — HYDROCHLOROTHIAZIDE 12.5 MG: 12.5 | 90 days supply | Qty: 90 | Fill #1

## 2018-08-22 MED FILL — ALBUTEROL SULFATE HFA 108 (: 108 (90 BAS | 48 days supply | Qty: 54 | Fill #4

## 2018-08-22 MED FILL — SYMBICORT 160-4.5 MCG INH: 160-4.5 | 90 days supply | Qty: 31 | Fill #4

## 2018-09-05 DIAGNOSIS — E7800 Pure hypercholesterolemia, unspecified: Secondary | ICD-10-CM | POA: Diagnosis not present

## 2018-09-05 DIAGNOSIS — J45909 Unspecified asthma, uncomplicated: Secondary | ICD-10-CM | POA: Diagnosis not present

## 2018-09-05 DIAGNOSIS — E1169 Type 2 diabetes mellitus with other specified complication: Secondary | ICD-10-CM | POA: Diagnosis not present

## 2018-09-05 DIAGNOSIS — I10 Essential (primary) hypertension: Secondary | ICD-10-CM | POA: Diagnosis not present

## 2018-09-06 MED FILL — metFORMIN HCL 500 MG TABS: 500 | 90 days supply | Qty: 90 | Fill #0

## 2018-10-03 MED FILL — HYDROCHLOROTHIAZIDE 12.5 MG: 12.5 | 90 days supply | Qty: 90 | Fill #2

## 2018-11-15 DIAGNOSIS — F419 Anxiety disorder, unspecified: Secondary | ICD-10-CM | POA: Diagnosis not present

## 2018-11-15 DIAGNOSIS — U071 COVID-19: Secondary | ICD-10-CM | POA: Diagnosis not present

## 2018-12-07 ENCOUNTER — Other Ambulatory Visit: Payer: Self-pay | Admitting: Internal Medicine

## 2018-12-07 DIAGNOSIS — Z1231 Encounter for screening mammogram for malignant neoplasm of breast: Secondary | ICD-10-CM

## 2018-12-22 MED FILL — ALBUTEROL SULFATE HFA 108 (: 108 (90 BAS | 48 days supply | Qty: 54 | Fill #5

## 2018-12-22 MED FILL — HYDROCHLOROTHIAZIDE 12.5 MG: 12.5 | 90 days supply | Qty: 90 | Fill #3

## 2018-12-27 DIAGNOSIS — E119 Type 2 diabetes mellitus without complications: Secondary | ICD-10-CM | POA: Diagnosis not present

## 2018-12-27 DIAGNOSIS — H5211 Myopia, right eye: Secondary | ICD-10-CM | POA: Diagnosis not present

## 2019-01-23 ENCOUNTER — Ambulatory Visit
Admission: RE | Admit: 2019-01-23 | Discharge: 2019-01-23 | Disposition: A | Discharge status: Home / Self Care | Payer: 59 | Source: Ambulatory Visit | Attending: Internal Medicine | Admitting: Internal Medicine

## 2019-01-23 ENCOUNTER — Other Ambulatory Visit: Payer: Self-pay

## 2019-01-23 DIAGNOSIS — Z1231 Encounter for screening mammogram for malignant neoplasm of breast: Secondary | ICD-10-CM

## 2019-02-07 MED FILL — ALBUTEROL SULFATE HFA 108 (: 108 (90 BAS | 50 days supply | Qty: 54 | Fill #0

## 2019-02-07 MED FILL — SYMBICORT 160-4.5 MCG INH: 160-4.5 | 30 days supply | Qty: 10 | Fill #0

## 2019-02-22 ENCOUNTER — Ambulatory Visit: Payer: 59 | Attending: Internal Medicine

## 2019-02-22 DIAGNOSIS — Z20828 Contact with and (suspected) exposure to other viral communicable diseases: Secondary | ICD-10-CM | POA: Insufficient documentation

## 2019-02-22 DIAGNOSIS — Z20822 Contact with and (suspected) exposure to covid-19: Secondary | ICD-10-CM

## 2019-02-23 ENCOUNTER — Other Ambulatory Visit: Payer: 59

## 2019-02-23 ENCOUNTER — Other Ambulatory Visit: Payer: Self-pay

## 2019-02-24 LAB — NOVEL CORONAVIRUS, NAA: SARS-CoV-2, NAA: NOT DETECTED

## 2019-03-08 ENCOUNTER — Ambulatory Visit: Payer: 59 | Attending: Internal Medicine

## 2019-03-08 DIAGNOSIS — Z20822 Contact with and (suspected) exposure to covid-19: Secondary | ICD-10-CM

## 2019-03-10 LAB — NOVEL CORONAVIRUS, NAA: SARS-CoV-2, NAA: NOT DETECTED

## 2019-03-15 DIAGNOSIS — Z1389 Encounter for screening for other disorder: Secondary | ICD-10-CM | POA: Diagnosis not present

## 2019-03-15 DIAGNOSIS — Z6837 Body mass index (BMI) 37.0-37.9, adult: Secondary | ICD-10-CM | POA: Diagnosis not present

## 2019-03-15 DIAGNOSIS — E1169 Type 2 diabetes mellitus with other specified complication: Secondary | ICD-10-CM | POA: Diagnosis not present

## 2019-03-15 DIAGNOSIS — Z0000 Encounter for general adult medical examination without abnormal findings: Secondary | ICD-10-CM | POA: Diagnosis not present

## 2019-03-15 DIAGNOSIS — J309 Allergic rhinitis, unspecified: Secondary | ICD-10-CM | POA: Diagnosis not present

## 2019-03-15 DIAGNOSIS — E7800 Pure hypercholesterolemia, unspecified: Secondary | ICD-10-CM | POA: Diagnosis not present

## 2019-03-15 DIAGNOSIS — I10 Essential (primary) hypertension: Secondary | ICD-10-CM | POA: Diagnosis not present

## 2019-03-15 MED FILL — HYDROCHLOROTHIAZIDE 12.5 MG: 12.5 | 90 days supply | Qty: 90 | Fill #0

## 2019-03-17 DIAGNOSIS — E1169 Type 2 diabetes mellitus with other specified complication: Secondary | ICD-10-CM | POA: Diagnosis not present

## 2019-03-23 DIAGNOSIS — D500 Iron deficiency anemia secondary to blood loss (chronic): Secondary | ICD-10-CM | POA: Diagnosis not present

## 2019-03-23 DIAGNOSIS — N920 Excessive and frequent menstruation with regular cycle: Secondary | ICD-10-CM | POA: Diagnosis not present

## 2019-03-23 DIAGNOSIS — Z01419 Encounter for gynecological examination (general) (routine) without abnormal findings: Secondary | ICD-10-CM | POA: Diagnosis not present

## 2019-03-31 MED FILL — FUSION PLUS CAPSULE: 30 days supply | Qty: 30 | Fill #0

## 2019-04-03 MED FILL — SYMBICORT 160-4.5 MCG INH: 160-4.5 | 30 days supply | Qty: 10 | Fill #1

## 2019-04-03 MED FILL — ALBUTEROL SULFATE HFA 108 (: 108 (90 BAS | 50 days supply | Qty: 54 | Fill #1

## 2019-04-03 MED FILL — metFORMIN HCL 500 MG TABS: 500 | 30 days supply | Qty: 30 | Fill #1

## 2019-04-04 ENCOUNTER — Other Ambulatory Visit: Payer: Self-pay | Admitting: Obstetrics and Gynecology

## 2019-04-04 DIAGNOSIS — Z309 Encounter for contraceptive management, unspecified: Secondary | ICD-10-CM | POA: Diagnosis not present

## 2019-04-04 DIAGNOSIS — N921 Excessive and frequent menstruation with irregular cycle: Secondary | ICD-10-CM | POA: Diagnosis not present

## 2019-04-04 DIAGNOSIS — Z3202 Encounter for pregnancy test, result negative: Secondary | ICD-10-CM | POA: Diagnosis not present

## 2019-05-16 MED FILL — METFORMIN HCL 500 MG TABS: 500 | 90 days supply | Qty: 90 | Fill #2

## 2019-07-20 MED FILL — ALBUTEROL SULFATE HFA 108 (: 108 (90 BAS | 50 days supply | Qty: 54 | Fill #2

## 2019-07-20 MED FILL — FUSION PLUS CAPSULE: 60 days supply | Qty: 60 | Fill #1

## 2019-07-20 MED FILL — HYDROCHLOROTHIAZIDE 12.5 MG: 12.5 | 90 days supply | Qty: 90 | Fill #1

## 2019-07-20 MED FILL — SYMBICORT 160-4.5 MCG INH: 160-4.5 | 90 days supply | Qty: 31 | Fill #2

## 2019-07-25 ENCOUNTER — Other Ambulatory Visit (HOSPITAL_COMMUNITY): Payer: Self-pay | Admitting: Internal Medicine

## 2019-07-25 DIAGNOSIS — L989 Disorder of the skin and subcutaneous tissue, unspecified: Secondary | ICD-10-CM | POA: Diagnosis not present

## 2019-07-25 DIAGNOSIS — J45909 Unspecified asthma, uncomplicated: Secondary | ICD-10-CM | POA: Diagnosis not present

## 2019-07-25 DIAGNOSIS — I10 Essential (primary) hypertension: Secondary | ICD-10-CM | POA: Diagnosis not present

## 2019-07-25 DIAGNOSIS — E1169 Type 2 diabetes mellitus with other specified complication: Secondary | ICD-10-CM | POA: Diagnosis not present

## 2019-07-25 DIAGNOSIS — D500 Iron deficiency anemia secondary to blood loss (chronic): Secondary | ICD-10-CM | POA: Diagnosis not present

## 2019-07-25 MED FILL — FUSION PLUS CAPSULE: 30 days supply | Qty: 30 | Fill #0

## 2019-08-07 ENCOUNTER — Ambulatory Visit: Payer: 59 | Attending: Internal Medicine

## 2019-08-18 DIAGNOSIS — L280 Lichen simplex chronicus: Secondary | ICD-10-CM | POA: Diagnosis not present

## 2019-08-28 ENCOUNTER — Ambulatory Visit: Payer: 59 | Attending: Internal Medicine

## 2019-08-28 DIAGNOSIS — Z20822 Contact with and (suspected) exposure to covid-19: Secondary | ICD-10-CM | POA: Diagnosis not present

## 2019-08-29 LAB — NOVEL CORONAVIRUS, NAA: SARS-CoV-2, NAA: NOT DETECTED

## 2019-08-29 LAB — SARS-COV-2, NAA 2 DAY TAT

## 2019-08-31 MED FILL — METFORMIN HCL 500 MG TABS: 500 | 90 days supply | Qty: 90 | Fill #3

## 2019-11-30 ENCOUNTER — Other Ambulatory Visit (HOSPITAL_COMMUNITY): Payer: Self-pay | Admitting: Internal Medicine

## 2019-11-30 DIAGNOSIS — I10 Essential (primary) hypertension: Secondary | ICD-10-CM | POA: Diagnosis not present

## 2019-11-30 DIAGNOSIS — D500 Iron deficiency anemia secondary to blood loss (chronic): Secondary | ICD-10-CM | POA: Diagnosis not present

## 2019-11-30 DIAGNOSIS — F419 Anxiety disorder, unspecified: Secondary | ICD-10-CM | POA: Diagnosis not present

## 2019-11-30 DIAGNOSIS — E1169 Type 2 diabetes mellitus with other specified complication: Secondary | ICD-10-CM | POA: Diagnosis not present

## 2019-11-30 DIAGNOSIS — J45909 Unspecified asthma, uncomplicated: Secondary | ICD-10-CM | POA: Diagnosis not present

## 2019-11-30 MED FILL — busPIRone HCL 5 MG TABS: 5 | 30 days supply | Qty: 90 | Fill #0

## 2019-12-01 MED FILL — ALBUTEROL SULFATE HFA 108 (: 108 (90 BAS | 50 days supply | Qty: 54 | Fill #3

## 2019-12-11 ENCOUNTER — Other Ambulatory Visit: Payer: Self-pay | Admitting: Internal Medicine

## 2019-12-11 DIAGNOSIS — Z1231 Encounter for screening mammogram for malignant neoplasm of breast: Secondary | ICD-10-CM

## 2019-12-20 ENCOUNTER — Other Ambulatory Visit (HOSPITAL_COMMUNITY): Payer: Self-pay | Admitting: Internal Medicine

## 2019-12-20 MED FILL — SYMBICORT 160-4.5 MCG INH: 160-4.5 | 90 days supply | Qty: 31 | Fill #3

## 2019-12-20 MED FILL — METFORMIN HCL 500 MG TABS: 500 | 90 days supply | Qty: 90 | Fill #0

## 2020-01-08 DIAGNOSIS — Z23 Encounter for immunization: Secondary | ICD-10-CM | POA: Diagnosis not present

## 2020-01-08 DIAGNOSIS — L989 Disorder of the skin and subcutaneous tissue, unspecified: Secondary | ICD-10-CM | POA: Diagnosis not present

## 2020-01-30 ENCOUNTER — Other Ambulatory Visit: Payer: Self-pay

## 2020-01-30 ENCOUNTER — Ambulatory Visit
Admission: RE | Admit: 2020-01-30 | Discharge: 2020-01-30 | Disposition: A | Discharge status: Home / Self Care | Payer: 59 | Source: Ambulatory Visit | Attending: Internal Medicine | Admitting: Internal Medicine

## 2020-01-30 DIAGNOSIS — Z1231 Encounter for screening mammogram for malignant neoplasm of breast: Secondary | ICD-10-CM

## 2020-01-30 DIAGNOSIS — R700 Elevated erythrocyte sedimentation rate: Secondary | ICD-10-CM | POA: Diagnosis not present

## 2020-02-12 ENCOUNTER — Other Ambulatory Visit (HOSPITAL_COMMUNITY): Payer: Self-pay | Admitting: Dermatology

## 2020-02-12 DIAGNOSIS — L590 Erythema ab igne [dermatitis ab igne]: Secondary | ICD-10-CM | POA: Diagnosis not present

## 2020-02-12 MED FILL — HYDROQUINONE 4 % CREA: 4 | 28 days supply | Qty: 28 | Fill #0

## 2020-02-14 ENCOUNTER — Other Ambulatory Visit (HOSPITAL_COMMUNITY): Payer: Self-pay | Admitting: Internal Medicine

## 2020-02-14 MED FILL — ALBUTEROL SULFATE HFA 108 (: 108 (90 BAS | 50 days supply | Qty: 54 | Fill #0

## 2020-02-14 MED FILL — busPIRone HCL 5 MG TABS: 5 | 30 days supply | Qty: 90 | Fill #1

## 2020-02-14 MED FILL — HYDROCHLOROTHIAZIDE 12.5 MG: 12.5 | 90 days supply | Qty: 90 | Fill #2

## 2020-02-14 MED FILL — FUSION PLUS CAPSULE: 30 days supply | Qty: 30 | Fill #1

## 2020-02-29 DIAGNOSIS — E083293 Diabetes mellitus due to underlying condition with mild nonproliferative diabetic retinopathy without macular edema, bilateral: Secondary | ICD-10-CM | POA: Diagnosis not present

## 2020-02-29 DIAGNOSIS — H52223 Regular astigmatism, bilateral: Secondary | ICD-10-CM | POA: Diagnosis not present

## 2020-05-15 ENCOUNTER — Other Ambulatory Visit (HOSPITAL_COMMUNITY): Payer: Self-pay | Admitting: Obstetrics and Gynecology

## 2020-05-15 DIAGNOSIS — Z01419 Encounter for gynecological examination (general) (routine) without abnormal findings: Secondary | ICD-10-CM | POA: Diagnosis not present

## 2020-05-15 DIAGNOSIS — N939 Abnormal uterine and vaginal bleeding, unspecified: Secondary | ICD-10-CM | POA: Diagnosis not present

## 2020-05-15 DIAGNOSIS — Z124 Encounter for screening for malignant neoplasm of cervix: Secondary | ICD-10-CM | POA: Diagnosis not present

## 2020-05-15 DIAGNOSIS — B009 Herpesviral infection, unspecified: Secondary | ICD-10-CM | POA: Diagnosis not present

## 2020-05-20 ENCOUNTER — Other Ambulatory Visit (HOSPITAL_BASED_OUTPATIENT_CLINIC_OR_DEPARTMENT_OTHER): Payer: Self-pay

## 2020-05-21 ENCOUNTER — Other Ambulatory Visit (HOSPITAL_COMMUNITY): Payer: Self-pay | Admitting: Internal Medicine

## 2020-05-21 MED FILL — FUSION PLUS CAPSULE: 30 days supply | Qty: 30 | Fill #2

## 2020-05-21 MED FILL — SYMBICORT 160-4.5 MCG INH: 160-4.5 | 90 days supply | Qty: 31 | Fill #0

## 2020-05-21 MED FILL — HYDROCHLOROTHIAZIDE 12.5 MG: 12.5 | 90 days supply | Qty: 90 | Fill #0

## 2020-06-03 DIAGNOSIS — Z0000 Encounter for general adult medical examination without abnormal findings: Secondary | ICD-10-CM | POA: Diagnosis not present

## 2020-06-03 DIAGNOSIS — I10 Essential (primary) hypertension: Secondary | ICD-10-CM | POA: Diagnosis not present

## 2020-06-03 DIAGNOSIS — Z1389 Encounter for screening for other disorder: Secondary | ICD-10-CM | POA: Diagnosis not present

## 2020-06-03 DIAGNOSIS — E1169 Type 2 diabetes mellitus with other specified complication: Secondary | ICD-10-CM | POA: Diagnosis not present

## 2020-06-03 DIAGNOSIS — R700 Elevated erythrocyte sedimentation rate: Secondary | ICD-10-CM | POA: Diagnosis not present

## 2020-06-03 DIAGNOSIS — E7800 Pure hypercholesterolemia, unspecified: Secondary | ICD-10-CM | POA: Diagnosis not present

## 2020-06-11 DIAGNOSIS — R700 Elevated erythrocyte sedimentation rate: Secondary | ICD-10-CM | POA: Diagnosis not present

## 2020-06-11 DIAGNOSIS — M79642 Pain in left hand: Secondary | ICD-10-CM | POA: Diagnosis not present

## 2020-06-11 DIAGNOSIS — M79641 Pain in right hand: Secondary | ICD-10-CM | POA: Diagnosis not present

## 2020-06-11 DIAGNOSIS — E669 Obesity, unspecified: Secondary | ICD-10-CM | POA: Diagnosis not present

## 2020-06-11 DIAGNOSIS — R252 Cramp and spasm: Secondary | ICD-10-CM | POA: Diagnosis not present

## 2020-06-11 DIAGNOSIS — M25541 Pain in joints of right hand: Secondary | ICD-10-CM | POA: Diagnosis not present

## 2020-06-11 DIAGNOSIS — M25571 Pain in right ankle and joints of right foot: Secondary | ICD-10-CM | POA: Diagnosis not present

## 2020-06-11 DIAGNOSIS — M79672 Pain in left foot: Secondary | ICD-10-CM | POA: Diagnosis not present

## 2020-06-11 DIAGNOSIS — M25542 Pain in joints of left hand: Secondary | ICD-10-CM | POA: Diagnosis not present

## 2020-06-11 DIAGNOSIS — R5383 Other fatigue: Secondary | ICD-10-CM | POA: Diagnosis not present

## 2020-06-11 DIAGNOSIS — M7989 Other specified soft tissue disorders: Secondary | ICD-10-CM | POA: Diagnosis not present

## 2020-06-11 DIAGNOSIS — M25572 Pain in left ankle and joints of left foot: Secondary | ICD-10-CM | POA: Diagnosis not present

## 2020-06-11 DIAGNOSIS — M79671 Pain in right foot: Secondary | ICD-10-CM | POA: Diagnosis not present

## 2020-06-11 DIAGNOSIS — D649 Anemia, unspecified: Secondary | ICD-10-CM | POA: Diagnosis not present

## 2020-08-02 ENCOUNTER — Other Ambulatory Visit: Payer: Self-pay

## 2020-08-02 ENCOUNTER — Other Ambulatory Visit (HOSPITAL_COMMUNITY): Payer: Self-pay

## 2020-08-07 ENCOUNTER — Other Ambulatory Visit: Payer: Self-pay

## 2020-08-07 ENCOUNTER — Other Ambulatory Visit (HOSPITAL_COMMUNITY): Payer: Self-pay

## 2020-08-08 ENCOUNTER — Other Ambulatory Visit (HOSPITAL_COMMUNITY): Payer: Self-pay

## 2020-08-08 MED ORDER — FUSION PLUS PO CAPS
ORAL_CAPSULE | ORAL | 6 refills | Status: DC
  Filled 2020-08-08: qty 30, 30d supply, fill #0
  Filled 2020-09-09: qty 30, 30d supply, fill #1
  Filled 2020-12-04: qty 30, 30d supply, fill #2

## 2020-08-09 ENCOUNTER — Other Ambulatory Visit (HOSPITAL_COMMUNITY): Payer: Self-pay

## 2020-08-12 ENCOUNTER — Other Ambulatory Visit (HOSPITAL_COMMUNITY): Payer: Self-pay

## 2020-09-09 ENCOUNTER — Other Ambulatory Visit (HOSPITAL_COMMUNITY): Payer: Self-pay

## 2020-09-09 MED ORDER — HYDROCHLOROTHIAZIDE 12.5 MG PO CAPS
12.5000 mg | ORAL_CAPSULE | Freq: Every morning | ORAL | 3 refills | Status: AC
  Filled 2020-09-09: qty 90, 90d supply, fill #0
  Filled 2020-12-04: qty 90, 90d supply, fill #1
  Filled 2021-05-05: qty 90, 90d supply, fill #2

## 2020-09-09 MED FILL — Albuterol Sulfate Inhal Aero 108 MCG/ACT (90MCG Base Equiv): RESPIRATORY_TRACT | 50 days supply | Qty: 54 | Fill #0 | Status: AC

## 2020-09-09 MED FILL — Budesonide-Formoterol Fumarate Dihyd Aerosol 160-4.5 MCG/ACT: RESPIRATORY_TRACT | 30 days supply | Qty: 10.2 | Fill #0 | Status: AC

## 2020-09-09 MED FILL — Buspirone HCl Tab 5 MG: ORAL | 30 days supply | Qty: 90 | Fill #0 | Status: AC

## 2020-09-10 ENCOUNTER — Other Ambulatory Visit (HOSPITAL_COMMUNITY): Payer: Self-pay

## 2020-12-04 ENCOUNTER — Other Ambulatory Visit (HOSPITAL_COMMUNITY): Payer: Self-pay

## 2020-12-04 ENCOUNTER — Other Ambulatory Visit: Payer: Self-pay

## 2020-12-04 MED FILL — Albuterol Sulfate Inhal Aero 108 MCG/ACT (90MCG Base Equiv): RESPIRATORY_TRACT | 50 days supply | Qty: 54 | Fill #1 | Status: AC

## 2020-12-05 ENCOUNTER — Other Ambulatory Visit (HOSPITAL_COMMUNITY): Payer: Self-pay

## 2020-12-05 MED ORDER — BUSPIRONE HCL 5 MG PO TABS
ORAL_TABLET | ORAL | 2 refills | Status: DC
  Filled 2020-12-05: qty 90, 30d supply, fill #0
  Filled 2021-02-26: qty 90, 30d supply, fill #1
  Filled 2021-05-05: qty 90, 30d supply, fill #2

## 2020-12-10 ENCOUNTER — Other Ambulatory Visit (HOSPITAL_COMMUNITY): Payer: Self-pay

## 2020-12-14 DIAGNOSIS — Z23 Encounter for immunization: Secondary | ICD-10-CM | POA: Diagnosis not present

## 2020-12-14 DIAGNOSIS — Z111 Encounter for screening for respiratory tuberculosis: Secondary | ICD-10-CM | POA: Diagnosis not present

## 2020-12-16 DIAGNOSIS — Z111 Encounter for screening for respiratory tuberculosis: Secondary | ICD-10-CM | POA: Diagnosis not present

## 2020-12-24 ENCOUNTER — Other Ambulatory Visit: Payer: Self-pay | Admitting: Internal Medicine

## 2020-12-24 DIAGNOSIS — Z1231 Encounter for screening mammogram for malignant neoplasm of breast: Secondary | ICD-10-CM

## 2020-12-31 DIAGNOSIS — J4520 Mild intermittent asthma, uncomplicated: Secondary | ICD-10-CM | POA: Diagnosis not present

## 2020-12-31 DIAGNOSIS — E7800 Pure hypercholesterolemia, unspecified: Secondary | ICD-10-CM | POA: Diagnosis not present

## 2020-12-31 DIAGNOSIS — E1169 Type 2 diabetes mellitus with other specified complication: Secondary | ICD-10-CM | POA: Diagnosis not present

## 2020-12-31 DIAGNOSIS — D500 Iron deficiency anemia secondary to blood loss (chronic): Secondary | ICD-10-CM | POA: Diagnosis not present

## 2020-12-31 DIAGNOSIS — F419 Anxiety disorder, unspecified: Secondary | ICD-10-CM | POA: Diagnosis not present

## 2021-01-02 ENCOUNTER — Other Ambulatory Visit (HOSPITAL_COMMUNITY): Payer: Self-pay

## 2021-01-02 MED ORDER — FUSION PLUS PO CAPS
ORAL_CAPSULE | ORAL | 6 refills | Status: DC
  Filled 2021-01-02: qty 60, 46d supply, fill #0

## 2021-01-03 ENCOUNTER — Other Ambulatory Visit (HOSPITAL_COMMUNITY): Payer: Self-pay

## 2021-01-06 ENCOUNTER — Other Ambulatory Visit (HOSPITAL_COMMUNITY): Payer: Self-pay

## 2021-01-22 ENCOUNTER — Other Ambulatory Visit (HOSPITAL_COMMUNITY): Payer: Self-pay

## 2021-01-22 DIAGNOSIS — H16143 Punctate keratitis, bilateral: Secondary | ICD-10-CM | POA: Diagnosis not present

## 2021-01-22 DIAGNOSIS — H52223 Regular astigmatism, bilateral: Secondary | ICD-10-CM | POA: Diagnosis not present

## 2021-01-22 MED ORDER — METHYLPREDNISOLONE 4 MG PO TBPK
ORAL_TABLET | ORAL | 1 refills | Status: DC
  Filled 2021-01-22: qty 21, 6d supply, fill #0

## 2021-01-22 MED ORDER — FLUOROMETHOLONE 0.1 % OP SUSP
OPHTHALMIC | 0 refills | Status: DC
  Filled 2021-01-22: qty 5, 7d supply, fill #0

## 2021-01-22 MED ORDER — AMOXICILLIN 500 MG PO CAPS
ORAL_CAPSULE | ORAL | 1 refills | Status: DC
  Filled 2021-01-22: qty 21, 7d supply, fill #0

## 2021-01-31 ENCOUNTER — Ambulatory Visit
Admission: RE | Admit: 2021-01-31 | Discharge: 2021-01-31 | Disposition: A | Discharge status: Home / Self Care | Payer: 59 | Source: Ambulatory Visit

## 2021-01-31 DIAGNOSIS — Z1231 Encounter for screening mammogram for malignant neoplasm of breast: Secondary | ICD-10-CM

## 2021-01-31 DIAGNOSIS — D649 Anemia, unspecified: Secondary | ICD-10-CM | POA: Diagnosis not present

## 2021-02-03 ENCOUNTER — Telehealth: Payer: Self-pay | Admitting: Physician Assistant

## 2021-02-03 ENCOUNTER — Other Ambulatory Visit: Payer: Self-pay | Admitting: Internal Medicine

## 2021-02-03 ENCOUNTER — Other Ambulatory Visit: Payer: Self-pay | Admitting: Critical Care Medicine

## 2021-02-03 DIAGNOSIS — R928 Other abnormal and inconclusive findings on diagnostic imaging of breast: Secondary | ICD-10-CM

## 2021-02-03 NOTE — Progress Notes (Signed)
[place] [phone]   [phone] [place] NOTE  Patient Care Team: Wenda Low, MD as PCP - General (Internal Medicine)  Hematological/Oncological History 1) Labs from PCP, Dr. Wenda Low: -01/02/2021: WBC 7.2, Hgb 9.3 (L), MCV 70.7 (L), Plt 429 (H), Iron 19 (L), Iron saturation 4% (L), TIBC 523 (H).  -01/31/2021: WBC 6.7, Hgb 9.5 (L), MCV 71.2 (L), Plt 405 (H)  2) 02/04/2021: Establish care with [facility] Hematology/Oncology  CHIEF COMPLAINTS/PURPOSE OF CONSULTATION:  Iron deficiency anemia and thrombocytosis  HISTORY OF PRESENTING ILLNESS:  Tina Flores 45 y.o. female with medical history significant for allergic rhinitis, asthma, diabetes, hypertension, hyperlipidemia, sleep apnea and obesity s/p weight loss surgery.  She is unaccompanied for this visit.  On exam today, Tina Flores reports her energy levels are fairly stable.  She denies any fatigue at this time and completes all her ADLs on her own.  She has a good appetite and denies any dietary restrictions.  She denies any nausea, vomiting or abdominal pain.  Her bowel habits are unchanged without any diarrhea or constipation.  She denies easy bruising or signs of bleeding except for her monthly menstrual cycle.  She adds that her menstrual cycle lasts approximately 5 days with 2 to 3 days of heavy bleeding.  She uses a tampon pad and changes it every 2 hours.  She was prescribed Lysteda by her OB/GYN to minimize menstrual bleeding but she has yet to start taking the medication.  She is currently taking oral iron supplementation once a day without any side effects.  She reports having a history of cravings.  Patient denies any fevers, chills, night sweats, shortness of breath, chest pain or cough.  She has no other complaints. Rest of the 10 point ROS is below.  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   HTN (hypertension)    obesity     SURGICAL HISTORY: Past Surgical History:  Procedure Laterality Date   CESAREAN SECTION     x 2   OVARIAN CYST REMOVAL  03/02/2001   left   REFRACTIVE SURGERY     Stomach Intestinal Pylorus Sparing (SIPS)   2016   TONSILLECTOMY     age 59   WISDOM TOOTH EXTRACTION  03/03/2007    SOCIAL HISTORY: Social History   Socioeconomic History   Marital status: Married    Spouse name: Not on file   Number of children: 2   Years of education: Not on file   Highest education level: Not on file  Occupational History   Occupation: -Licensed Diplomatic Services operational officer: [employer]  Tobacco Use   Smoking status: Never   Smokeless tobacco: Never  Substance and Sexual Activity   Alcohol use: Yes    Comment: occasional, once a month   Drug use: No   Sexual activity: Not on file    Comment: BTL  Other Topics Concern   Not on file  Social History Narrative   Not on file   Social Determinants of Health   Financial Resource Strain: Not on file  Food Insecurity: Not on file  Transportation Needs: Not on file  Physical Activity: Not on file  Stress: Not  on file  Social Connections: Not on file  Intimate Partner Violence: Not on file    FAMILY HISTORY: Family History  Problem Relation Age of Onset   Asthma Mother    Hyperlipidemia Mother    Hypertension Mother    Asthma Father    Diabetes Father    Hypertension Father    Asthma Sister    Anxiety disorder Sister    Lupus Brother    Heart attack Paternal Grandmother    Uterine cancer Maternal Aunt     ALLERGIES:  has No Known Allergies.  MEDICATIONS:  Current Outpatient Medications  Medication Sig Dispense Refill   albuterol (VENTOLIN HFA) 108 (90 Base) MCG/ACT inhaler INHALE 2 PUFFS INTO THE LUNGS EVERY 4 TO 6 HOURS AS  NEEDED 54 g 5   budesonide-formoterol (SYMBICORT) 160-4.5 MCG/ACT inhaler INHALE 2 PUFFS INTO THE LUNGS 2 TIMES DAILY 30.6 g 1   busPIRone (BUSPAR) 5 MG tablet Take one tablet by mouth three times daily. 90 tablet 2   hydrochlorothiazide (MICROZIDE) 12.5 MG capsule TAKE 1 CAPSULE BY MOUTH ONCE DAILY IN THE MORNING 90 capsule 3   Iron-FA-B Cmp-C-Biot-Probiotic (FUSION PLUS) CAPS Take 1 capsule by mouth between meals twice a day for 2 weeks then back to once a day. 30 capsule 6   potassium chloride SA (K-DUR,KLOR-CON) 20 MEQ tablet Take 20 mEq by mouth daily.     valACYclovir (VALTREX) 500 MG tablet TAKE 1 TABLET BY MOUTH ONCE A DAY 30 tablet 6   albuterol (PROVENTIL HFA;VENTOLIN HFA) 108 (90 Base) MCG/ACT inhaler Inhale 1-2 puffs into the lungs every 6 (six) hours as needed for wheezing or shortness of breath.     amLODipine-benazepril (LOTREL) 5-20 MG capsule Take 1 capsule by mouth daily. (Patient not taking: Reported on 02/04/2021)     amoxicillin (AMOXIL) 500 MG capsule TAKE 2 CAPSULES NOW, THEN 1 CAPSULE 3 TIMES A DAY UNTIL GONE (Patient not taking: Reported on 02/04/2021) 21 capsule 1   budesonide-formoterol (SYMBICORT) 160-4.5 MCG/ACT inhaler Inhale 2 puffs into the lungs 2 (two) times daily.     cholestyramine light (PREVALITE) 4 g packet Take 1 packet (4 g total) by mouth 3 (three) times daily before meals. 12 packet 0   fluorometholone (FML) 0.1 % ophthalmic suspension Instill 1 drop into both eyes 4 times a day as directed (Patient not taking: Reported on 02/04/2021) 5 mL 0   hydroquinone 4 % cream APPLY 1 APPLICATION ON THE SKIN TWICE A DAY (Patient not taking: Reported on 02/04/2021) 28.35 g 2   Iron-FA-B Cmp-C-Biot-Probiotic (FUSION PLUS) CAPS Take 1 capsule between meals by mouth once daily (Patient not taking: Reported on 02/04/2021) 30 capsule 6   metFORMIN (GLUCOPHAGE) 500 MG tablet TAKE 1 TABLET BY MOUTH ONCE A DAY WITH A MEAL 90 tablet 3   methylPREDNISolone (MEDROL) 4 MG TBPK tablet  TAKE AS DIRECTED PER PACKAGE DIRECTIONS (Patient not taking: Reported on 02/04/2021) 21 tablet 1   spironolactone (ALDACTONE) 50 MG tablet Take 50 mg by mouth daily. (Patient not taking: Reported on 02/04/2021)     tranexamic acid (LYSTEDA) 650 MG TABS tablet TAKE 2 TABLETS BY MOUTH 3 TIMES DAILY FOR UP TO 5 DAYS (Patient not taking: Reported on 02/04/2021) 18 tablet 6   valACYclovir (VALTREX) 500 MG tablet Take 500 mg by mouth daily as needed (for outbreaks).   11   No current facility-administered medications for this visit.    REVIEW OF SYSTEMS:   Constitutional: ( - ) fevers, ( - )  chills , ( - ) night sweats Eyes: ( - ) blurriness of vision, ( - ) double vision, ( - ) watery eyes Ears, nose, mouth, throat, and face: ( - ) mucositis, ( - ) sore throat Respiratory: ( - ) cough, ( - ) dyspnea, ( - ) wheezes Cardiovascular: ( - ) palpitation, ( - ) chest discomfort, ( - ) lower extremity swelling Gastrointestinal:  ( - ) nausea, ( - ) heartburn, ( - ) change in bowel habits Skin: ( - ) abnormal skin rashes Lymphatics: ( - ) new lymphadenopathy, ( - ) easy bruising Neurological: ( - ) numbness, ( - ) tingling, ( - ) new weaknesses Behavioral/Psych: ( - ) mood change, ( - ) new changes  All other systems were reviewed with the patient and are negative.  PHYSICAL EXAMINATION: ECOG PERFORMANCE STATUS: 1 - Symptomatic but completely ambulatory  Vitals:   02/04/21 0907  BP: 126/73  Pulse: 69  Resp: 17  Temp: 97.7 F (36.5 C)  SpO2: 100%   Filed Weights   02/04/21 0907  Weight: 230 lb 3.2 oz (104.4 kg)    GENERAL: well appearing African American female in NAD  SKIN: skin color, texture, turgor are normal, no rashes or significant lesions EYES: conjunctiva are pink and non-injected, sclera clear OROPHARYNX: no exudate, no erythema; lips, buccal mucosa, and tongue normal  NECK: supple, non-tender LYMPH:  no palpable lymphadenopathy in the cervical or supraclavicular lymph nodes.   LUNGS: clear to auscultation and percussion with normal breathing effort HEART: regular rate & rhythm and no murmurs and no lower extremity edema ABDOMEN: soft, non-tender, non-distended, normal bowel sounds Musculoskeletal: no cyanosis of digits and no clubbing  PSYCH: alert & oriented x 3, fluent speech NEURO: no focal motor/sensory deficits  LABORATORY DATA:  I have reviewed the data as listed CBC Latest Ref Rng & Units 02/09/2016 02/08/2016 02/07/2016  WBC 4.0 - 10.5 K/uL 4.8 4.9 4.2  Hemoglobin 12.0 - 15.0 g/dL 11.4(L) 11.1(L) 11.9(L)  Hematocrit 36.0 - 46.0 % 32.6(L) 33.9(L) 36.0  Platelets 150 - 400 K/uL 317 316 304    CMP Latest Ref Rng & Units 02/09/2016 02/08/2016 02/07/2016  Glucose 65 - 99 mg/dL 112(H) 117(H) 143(H)  BUN 6 - 20 mg/dL 10 12 16   Creatinine 0.44 - 1.00 mg/dL 0.97 1.02(H) 1.05(H)  Sodium 135 - 145 mmol/L 138 141 138  Potassium 3.5 - 5.1 mmol/L 3.4(L) 3.7 3.4(L)  Chloride 101 - 111 mmol/L 113(H) 116(H) 112(H)  CO2 22 - 32 mmol/L 18(L) 20(L) 16(L)  Calcium 8.9 - 10.3 mg/dL 8.3(L) 8.5(L) 8.2(L)  Total Protein 6.5 - 8.1 g/dL - - -  Total Bilirubin 0.3 - 1.2 mg/dL - - -  Alkaline Phos 38 - 126 U/L - - -  AST 15 - 41 U/L - - -  ALT 14 - 54 U/L - - -    ASSESSMENT & PLAN Tina Flores is a 45 y.o. female who presents to the clinic for evaluation for iron deficiency anemia and thrombocytosis.  I reviewed the prior labs and explained that thrombocytosis is likely secondary to iron deficiency anemia.  Patient is currently on oral iron supplementation and tolerating it well.   The likely cause for iron deficiency anemia is multifactorial from heavy menstrual bleeding and history of SIPS (weight loss surgery).   Patient will proceed with serologic evaluation by checking CBC, CMP, iron and TIBC, ferritin and reticulocyte panel.  If there is evidence of persistent iron deficiency while  on oral supplementation, we recommend IV iron replacement. Additionally, due to  history of weight loss surgery, we will check vitamin B12 and methylmalonic acid levels.   #Iron deficiency anemia: --Multifactorial from heavy menstrual bleeding and malabsorption from weight loss surgery --Currently on oral iron once daily. Due to lack of absorption, okay to discontinue. --Labs today to check CBC, CMP, iron and TIBC, ferritin and retic panel.  --Recommend IV iron, prefer IV monoferric x 1 dose. If insurance does not approve, then we will arrange for IV feraheme or IV venofer.  --RTC in 12 weeks with repeat labs.   #Thrombocytosis, mild: --Likely secondary to iron deficiency anemia --Monitor and repeat levels after iron replacement   Orders Placed This Encounter  Procedures   CBC with Differential ([place] Only)    Standing Status:   Future    Number of Occurrences:   1    Standing Expiration Date:   02/03/2022   Ferritin    Standing Status:   Future    Number of Occurrences:   1    Standing Expiration Date:   02/03/2022   CMP ([place] only)    Standing Status:   Future    Number of Occurrences:   1    Standing Expiration Date:   02/03/2022   Iron and TIBC    Standing Status:   Future    Number of Occurrences:   1    Standing Expiration Date:   02/03/2022   Retic Panel    Standing Status:   Future    Number of Occurrences:   1    Standing Expiration Date:   02/03/2022   Methylmalonic acid, serum    Standing Status:   Future    Number of Occurrences:   1    Standing Expiration Date:   02/04/2022   Vitamin B12    Standing Status:   Future    Number of Occurrences:   1    Standing Expiration Date:   02/04/2022    All questions were answered. The patient knows to call the clinic with any problems, questions or concerns.  I have spent a total of 60 minutes minutes of face-to-face and non-face-to-face time, preparing to see the patient, obtaining and/or reviewing separately obtained history, performing a medically appropriate examination, counseling  and educating the patient, ordering medications/tests, documenting clinical information in the electronic health record, and care coordination.   Dede Query, PA-C Department of Hematology/Oncology [place] at [facility] Phone: [phone]  Patient was seen with Dr. Lorenso Courier.   I have read the above note and personally examined the patient. I agree with the assessment and plan as noted above.  Briefly Tina Flores is a 45 year old female who presents for evaluation of iron deficiency anemia.  At this time it appears her iron deficiency is multifactorial due to weight loss surgery as well as heavy menstrual cycles.  Given my concern that she will not properly absorb p.o. iron therapy would recommend pursuing IV iron.  Additionally we will order vitamin B12 levels as this deficiency can be seen as well with weight loss surgeries.  We will need to follow with the patient indefinitely given the possibility of nutritional deficiencies with this surgery.   Ledell Peoples, MD Department of Hematology/Oncology [place] at [facility] Phone: [phone] Pager: [phone] Email: [email or web address]

## 2021-02-03 NOTE — Telephone Encounter (Signed)
Scheduled appt per 12/5 referral. Pt is aware of appt date and time.

## 2021-02-04 ENCOUNTER — Telehealth: Payer: Self-pay

## 2021-02-04 ENCOUNTER — Inpatient Hospital Stay: Payer: 59 | Attending: Physician Assistant | Admitting: Physician Assistant

## 2021-02-04 ENCOUNTER — Encounter: Payer: Self-pay | Admitting: Physician Assistant

## 2021-02-04 ENCOUNTER — Other Ambulatory Visit: Payer: Self-pay

## 2021-02-04 ENCOUNTER — Inpatient Hospital Stay: Payer: 59

## 2021-02-04 VITALS — BP 126/73 | HR 69 | Temp 97.7°F | Resp 17 | Ht 67.0 in | Wt 230.2 lb

## 2021-02-04 DIAGNOSIS — D75839 Thrombocytosis, unspecified: Secondary | ICD-10-CM | POA: Diagnosis not present

## 2021-02-04 DIAGNOSIS — D509 Iron deficiency anemia, unspecified: Secondary | ICD-10-CM | POA: Insufficient documentation

## 2021-02-04 DIAGNOSIS — D500 Iron deficiency anemia secondary to blood loss (chronic): Secondary | ICD-10-CM

## 2021-02-04 LAB — CBC WITH DIFFERENTIAL (CANCER CENTER ONLY)
Abs Immature Granulocytes: 0.02 10*3/uL (ref 0.00–0.07)
Basophils Absolute: 0 10*3/uL (ref 0.0–0.1)
Basophils Relative: 1 %
Eosinophils Absolute: 0.1 10*3/uL (ref 0.0–0.5)
Eosinophils Relative: 2 %
HCT: 29.3 % — ABNORMAL LOW (ref 36.0–46.0)
Hemoglobin: 8.7 g/dL — ABNORMAL LOW (ref 12.0–15.0)
Immature Granulocytes: 0 %
Lymphocytes Relative: 32 %
Lymphs Abs: 1.8 10*3/uL (ref 0.7–4.0)
MCH: 22.3 pg — ABNORMAL LOW (ref 26.0–34.0)
MCHC: 29.7 g/dL — ABNORMAL LOW (ref 30.0–36.0)
MCV: 75.1 fL — ABNORMAL LOW (ref 80.0–100.0)
Monocytes Absolute: 0.2 10*3/uL (ref 0.1–1.0)
Monocytes Relative: 4 %
Neutro Abs: 3.4 10*3/uL (ref 1.7–7.7)
Neutrophils Relative %: 61 %
Platelet Count: 447 10*3/uL — ABNORMAL HIGH (ref 150–400)
RBC: 3.9 MIL/uL (ref 3.87–5.11)
RDW: 18.3 % — ABNORMAL HIGH (ref 11.5–15.5)
WBC Count: 5.6 10*3/uL (ref 4.0–10.5)
nRBC: 0 % (ref 0.0–0.2)

## 2021-02-04 LAB — RETIC PANEL
Immature Retic Fract: 20 % — ABNORMAL HIGH (ref 2.3–15.9)
RBC.: 3.68 MIL/uL — ABNORMAL LOW (ref 3.87–5.11)
Retic Count, Absolute: 46.7 10*3/uL (ref 19.0–186.0)
Retic Ct Pct: 1.3 % (ref 0.4–3.1)
Reticulocyte Hemoglobin: 24.6 pg — ABNORMAL LOW (ref >27.9)

## 2021-02-04 LAB — CMP (CANCER CENTER ONLY)
ALT: 18 U/L (ref 0–44)
AST: 24 U/L (ref 15–41)
Albumin: 3.4 g/dL — ABNORMAL LOW (ref 3.5–5.0)
Alkaline Phosphatase: 91 U/L (ref 38–126)
Anion gap: 9 (ref 5–15)
BUN: 10 mg/dL (ref 6–20)
CO2: 25 mmol/L (ref 22–32)
Calcium: 8.5 mg/dL — ABNORMAL LOW (ref 8.9–10.3)
Chloride: 105 mmol/L (ref 98–111)
Creatinine: 0.94 mg/dL (ref 0.44–1.00)
GFR, Estimated: >60 mL/min (ref >60)
Glucose, Bld: 105 mg/dL — ABNORMAL HIGH (ref 70–99)
Potassium: 3.2 mmol/L — ABNORMAL LOW (ref 3.5–5.1)
Sodium: 139 mmol/L (ref 135–145)
Total Bilirubin: 0.4 mg/dL (ref 0.3–1.2)
Total Protein: 7.1 g/dL (ref 6.5–8.1)

## 2021-02-04 LAB — IRON AND TIBC
Iron: 74 ug/dL (ref 41–142)
Saturation Ratios: 19 % — ABNORMAL LOW (ref 21–57)
TIBC: 398 ug/dL (ref 236–444)
UIBC: 324 ug/dL (ref 120–384)

## 2021-02-04 LAB — FERRITIN: Ferritin: <4 ng/mL — ABNORMAL LOW (ref 11–307)

## 2021-02-04 LAB — VITAMIN B12: Vitamin B-12: 691 pg/mL (ref 180–914)

## 2021-02-04 NOTE — Telephone Encounter (Signed)
Pt advised of lab results and the need for IV iron. She is aware scheduling will be calling her to make arrangements.

## 2021-02-04 NOTE — Telephone Encounter (Signed)
-----   Message from Lincoln Brigham, PA-C sent at 02/04/2021 12:18 PM EST ----- Please call patient and let her know that labs confirm iron deficiency anemia. We will arrange for IV iron. Schedulers will be in touch to arrange infusion.

## 2021-02-07 ENCOUNTER — Other Ambulatory Visit: Payer: Self-pay | Admitting: Pharmacy Technician

## 2021-02-07 LAB — METHYLMALONIC ACID, SERUM: Methylmalonic Acid, Quantitative: 75 nmol/L (ref 0–378)

## 2021-02-12 ENCOUNTER — Other Ambulatory Visit: Payer: Self-pay | Admitting: Pharmacy Technician

## 2021-02-12 ENCOUNTER — Other Ambulatory Visit: Payer: Self-pay

## 2021-02-12 ENCOUNTER — Ambulatory Visit (INDEPENDENT_AMBULATORY_CARE_PROVIDER_SITE_OTHER): Payer: 59

## 2021-02-12 VITALS — BP 123/82 | HR 76 | Temp 98.0°F | Resp 18 | Ht 67.0 in | Wt 232.2 lb

## 2021-02-12 DIAGNOSIS — D500 Iron deficiency anemia secondary to blood loss (chronic): Secondary | ICD-10-CM

## 2021-02-12 MED ORDER — EPINEPHRINE 0.3 MG/0.3ML IJ SOAJ: 0.3000 mg | Freq: Once | INTRAMUSCULAR | Status: DC | PRN

## 2021-02-12 MED ORDER — FAMOTIDINE IN NACL 20-0.9 MG/50ML-% IV SOLN: 20.0000 mg | Freq: Once | INTRAVENOUS | Status: DC | PRN

## 2021-02-12 MED ORDER — SODIUM CHLORIDE 0.9 % IV SOLN: Freq: Once | INTRAVENOUS | Status: DC | PRN

## 2021-02-12 MED ORDER — ALBUTEROL SULFATE HFA 108 (90 BASE) MCG/ACT IN AERS: 2.0000 | INHALATION_SPRAY | Freq: Once | RESPIRATORY_TRACT | Status: DC | PRN

## 2021-02-12 MED ORDER — METHYLPREDNISOLONE SODIUM SUCC 125 MG IJ SOLR: 125.0000 mg | Freq: Once | INTRAMUSCULAR | Status: DC | PRN

## 2021-02-12 MED ORDER — SODIUM CHLORIDE 0.9 % IV SOLN
200.0000 mg | Freq: Once | INTRAVENOUS | Status: DC
  Filled 2021-02-12: qty 10

## 2021-02-12 MED ORDER — DIPHENHYDRAMINE HCL 50 MG/ML IJ SOLN: 50.0000 mg | Freq: Once | INTRAMUSCULAR | Status: DC | PRN

## 2021-02-12 MED ORDER — ACETAMINOPHEN 325 MG PO TABS
650.0000 mg | ORAL_TABLET | Freq: Once | ORAL | Status: AC
  Administered 2021-02-12: 15:00:00 650 mg via ORAL
  Filled 2021-02-12: qty 2

## 2021-02-12 MED ORDER — DIPHENHYDRAMINE HCL 25 MG PO CAPS
50.0000 mg | ORAL_CAPSULE | Freq: Once | ORAL | Status: AC
  Administered 2021-02-12: 15:00:00 50 mg via ORAL
  Filled 2021-02-12: qty 2

## 2021-02-12 NOTE — Progress Notes (Signed)
Patient came in today for iron infusion. Received pre medication but staff was unable to obtain IV access. Patient did not receive iron infusion today and re-scheduled for tomorrow 02/13/21.

## 2021-02-13 ENCOUNTER — Ambulatory Visit (INDEPENDENT_AMBULATORY_CARE_PROVIDER_SITE_OTHER): Payer: 59

## 2021-02-13 ENCOUNTER — Ambulatory Visit: Payer: 59

## 2021-02-13 VITALS — BP 111/73 | HR 73 | Temp 97.8°F | Resp 18 | Ht 67.0 in | Wt 234.6 lb

## 2021-02-13 DIAGNOSIS — D500 Iron deficiency anemia secondary to blood loss (chronic): Secondary | ICD-10-CM | POA: Diagnosis not present

## 2021-02-13 MED ORDER — ALBUTEROL SULFATE HFA 108 (90 BASE) MCG/ACT IN AERS: 2.0000 | INHALATION_SPRAY | Freq: Once | RESPIRATORY_TRACT | Status: DC | PRN

## 2021-02-13 MED ORDER — METHYLPREDNISOLONE SODIUM SUCC 125 MG IJ SOLR: 125.0000 mg | Freq: Once | INTRAMUSCULAR | Status: DC | PRN

## 2021-02-13 MED ORDER — DIPHENHYDRAMINE HCL 25 MG PO CAPS
50.0000 mg | ORAL_CAPSULE | Freq: Once | ORAL | Status: AC
  Administered 2021-02-13: 25 mg via ORAL
  Filled 2021-02-13: qty 2

## 2021-02-13 MED ORDER — SODIUM CHLORIDE 0.9 % IV SOLN: Freq: Once | INTRAVENOUS | Status: DC | PRN

## 2021-02-13 MED ORDER — EPINEPHRINE 0.3 MG/0.3ML IJ SOAJ: 0.3000 mg | Freq: Once | INTRAMUSCULAR | Status: DC | PRN

## 2021-02-13 MED ORDER — SODIUM CHLORIDE 0.9 % IV SOLN
200.0000 mg | Freq: Once | INTRAVENOUS | Status: AC
  Administered 2021-02-13: 200 mg via INTRAVENOUS
  Filled 2021-02-13: qty 10

## 2021-02-13 MED ORDER — FAMOTIDINE IN NACL 20-0.9 MG/50ML-% IV SOLN: 20.0000 mg | Freq: Once | INTRAVENOUS | Status: DC | PRN

## 2021-02-13 MED ORDER — DIPHENHYDRAMINE HCL 50 MG/ML IJ SOLN: 50.0000 mg | Freq: Once | INTRAMUSCULAR | Status: DC | PRN

## 2021-02-13 MED ORDER — ACETAMINOPHEN 325 MG PO TABS
650.0000 mg | ORAL_TABLET | Freq: Once | ORAL | Status: AC
  Administered 2021-02-13: 650 mg via ORAL
  Filled 2021-02-13: qty 2

## 2021-02-13 NOTE — Progress Notes (Signed)
Diagnosis: Iron Deficiency Anemia  Provider:  Marshell Garfinkel, MD  Procedure: Infusion  IV Type: Peripheral, IV Location: L Forearm  Venofer (Iron Sucrose), Dose: 200 mg  Infusion Start Time: 0914  Infusion Stop Time: 0277  Post Infusion IV Care: Observation period completed and Peripheral IV Discontinued  Discharge: Condition: Good, Destination: Home . AVS provided to patient.   Performed by:  Darin Arndt, Sherlon Handing, LPN

## 2021-02-19 ENCOUNTER — Ambulatory Visit (INDEPENDENT_AMBULATORY_CARE_PROVIDER_SITE_OTHER): Payer: 59

## 2021-02-19 ENCOUNTER — Other Ambulatory Visit: Payer: Self-pay

## 2021-02-19 VITALS — BP 114/75 | HR 72 | Temp 97.9°F | Resp 16 | Wt 235.4 lb

## 2021-02-19 DIAGNOSIS — N920 Excessive and frequent menstruation with regular cycle: Secondary | ICD-10-CM | POA: Diagnosis not present

## 2021-02-19 DIAGNOSIS — D500 Iron deficiency anemia secondary to blood loss (chronic): Secondary | ICD-10-CM

## 2021-02-19 DIAGNOSIS — K9049 Malabsorption due to intolerance, not elsewhere classified: Secondary | ICD-10-CM

## 2021-02-19 MED ORDER — FAMOTIDINE IN NACL 20-0.9 MG/50ML-% IV SOLN: 20.0000 mg | Freq: Once | INTRAVENOUS | Status: DC | PRN

## 2021-02-19 MED ORDER — SODIUM CHLORIDE 0.9 % IV SOLN: Freq: Once | INTRAVENOUS | Status: DC | PRN

## 2021-02-19 MED ORDER — DIPHENHYDRAMINE HCL 25 MG PO CAPS: 50.0000 mg | ORAL_CAPSULE | Freq: Once | ORAL | Status: DC

## 2021-02-19 MED ORDER — ALBUTEROL SULFATE HFA 108 (90 BASE) MCG/ACT IN AERS: 2.0000 | INHALATION_SPRAY | Freq: Once | RESPIRATORY_TRACT | Status: DC | PRN

## 2021-02-19 MED ORDER — DIPHENHYDRAMINE HCL 50 MG/ML IJ SOLN: 50.0000 mg | Freq: Once | INTRAMUSCULAR | Status: DC | PRN

## 2021-02-19 MED ORDER — SODIUM CHLORIDE 0.9 % IV SOLN
200.0000 mg | Freq: Once | INTRAVENOUS | Status: AC
  Administered 2021-02-19: 16:00:00 200 mg via INTRAVENOUS
  Filled 2021-02-19: qty 10

## 2021-02-19 MED ORDER — EPINEPHRINE 0.3 MG/0.3ML IJ SOAJ: 0.3000 mg | Freq: Once | INTRAMUSCULAR | Status: DC | PRN

## 2021-02-19 MED ORDER — ACETAMINOPHEN 325 MG PO TABS: 650.0000 mg | ORAL_TABLET | Freq: Once | ORAL | Status: DC

## 2021-02-19 MED ORDER — METHYLPREDNISOLONE SODIUM SUCC 125 MG IJ SOLR: 125.0000 mg | Freq: Once | INTRAMUSCULAR | Status: DC | PRN

## 2021-02-19 NOTE — Progress Notes (Signed)
Diagnosis: Iron Deficiency Anemia  Provider:  Marshell Garfinkel, MD  Procedure: Infusion  IV Type: Peripheral, IV Location: Left Forearm  Feraheme (Ferumoxytol), Dose: 510 mg  Infusion Start Time: 5726  Infusion Stop Time: 2035  Post Infusion IV Care: Peripheral IV Discontinued  Discharge: Condition: Good, Destination: Home . AVS provided to patient.   Performed by:  Koren Shiver, RN

## 2021-02-26 ENCOUNTER — Ambulatory Visit: Payer: 59

## 2021-02-26 ENCOUNTER — Other Ambulatory Visit (HOSPITAL_COMMUNITY): Payer: Self-pay

## 2021-02-27 ENCOUNTER — Ambulatory Visit: Payer: 59

## 2021-03-04 ENCOUNTER — Other Ambulatory Visit: Payer: Self-pay | Admitting: Pharmacy Technician

## 2021-03-04 NOTE — Progress Notes (Signed)
Spoke with Evelena Asa regarding patient's difficult IV access and agreement was to proceed with the next 2 doses of Venofer at 300mg  instead of 200mg  for a total of 1000mg  (including 2 previously given 200mg  doses)

## 2021-03-05 ENCOUNTER — Ambulatory Visit (INDEPENDENT_AMBULATORY_CARE_PROVIDER_SITE_OTHER): Payer: 59 | Admitting: *Deleted

## 2021-03-05 ENCOUNTER — Other Ambulatory Visit: Payer: Self-pay

## 2021-03-05 VITALS — BP 105/71 | HR 59 | Temp 97.9°F | Resp 16 | Ht 67.0 in | Wt 232.2 lb

## 2021-03-05 DIAGNOSIS — D500 Iron deficiency anemia secondary to blood loss (chronic): Secondary | ICD-10-CM

## 2021-03-05 MED ORDER — SODIUM CHLORIDE 0.9 % IV SOLN
300.0000 mg | Freq: Once | INTRAVENOUS | Status: AC
  Administered 2021-03-05: 300 mg via INTRAVENOUS
  Filled 2021-03-05: qty 15

## 2021-03-05 MED ORDER — DIPHENHYDRAMINE HCL 50 MG/ML IJ SOLN: 50.0000 mg | Freq: Once | INTRAMUSCULAR | Status: DC | PRN

## 2021-03-05 MED ORDER — DIPHENHYDRAMINE HCL 25 MG PO CAPS: 50.0000 mg | ORAL_CAPSULE | Freq: Once | ORAL | Status: DC

## 2021-03-05 MED ORDER — EPINEPHRINE 0.3 MG/0.3ML IJ SOAJ: 0.3000 mg | Freq: Once | INTRAMUSCULAR | Status: DC | PRN

## 2021-03-05 MED ORDER — ALBUTEROL SULFATE HFA 108 (90 BASE) MCG/ACT IN AERS: 2.0000 | INHALATION_SPRAY | Freq: Once | RESPIRATORY_TRACT | Status: DC | PRN

## 2021-03-05 MED ORDER — METHYLPREDNISOLONE SODIUM SUCC 125 MG IJ SOLR: 125.0000 mg | Freq: Once | INTRAMUSCULAR | Status: DC | PRN

## 2021-03-05 MED ORDER — SODIUM CHLORIDE 0.9 % IV SOLN: Freq: Once | INTRAVENOUS | Status: DC | PRN

## 2021-03-05 MED ORDER — ACETAMINOPHEN 325 MG PO TABS: 650.0000 mg | ORAL_TABLET | Freq: Once | ORAL | Status: DC

## 2021-03-05 MED ORDER — FAMOTIDINE IN NACL 20-0.9 MG/50ML-% IV SOLN: 20.0000 mg | Freq: Once | INTRAVENOUS | Status: DC | PRN

## 2021-03-05 NOTE — Progress Notes (Signed)
Diagnosis: Iron Deficiency Anemia  Provider:  Marshell Garfinkel, MD  Procedure: Infusion  IV Type: Peripheral, IV Location: L Forearm  Venofer (Iron Sucrose), Dose: 300 mg  Infusion Start Time: 0233 am  Infusion Stop Time: 1200 noon  Post Infusion IV Care: Observation period completed and Peripheral IV Discontinued  Discharge: Condition: Good, Destination: Home . AVS provided to patient.   Performed by:  Oren Beckmann, RN

## 2021-03-10 ENCOUNTER — Other Ambulatory Visit: Payer: 59

## 2021-03-11 ENCOUNTER — Ambulatory Visit: Payer: 59

## 2021-03-11 ENCOUNTER — Ambulatory Visit
Admission: RE | Admit: 2021-03-11 | Discharge: 2021-03-11 | Disposition: A | Discharge status: Home / Self Care | Payer: 59 | Source: Ambulatory Visit | Attending: Internal Medicine | Admitting: Internal Medicine

## 2021-03-11 DIAGNOSIS — R928 Other abnormal and inconclusive findings on diagnostic imaging of breast: Secondary | ICD-10-CM | POA: Diagnosis not present

## 2021-03-11 DIAGNOSIS — H35033 Hypertensive retinopathy, bilateral: Secondary | ICD-10-CM | POA: Diagnosis not present

## 2021-03-11 DIAGNOSIS — H52223 Regular astigmatism, bilateral: Secondary | ICD-10-CM | POA: Diagnosis not present

## 2021-03-11 DIAGNOSIS — E119 Type 2 diabetes mellitus without complications: Secondary | ICD-10-CM | POA: Diagnosis not present

## 2021-03-12 ENCOUNTER — Ambulatory Visit (INDEPENDENT_AMBULATORY_CARE_PROVIDER_SITE_OTHER): Payer: 59

## 2021-03-12 ENCOUNTER — Other Ambulatory Visit: Payer: Self-pay

## 2021-03-12 VITALS — BP 115/78 | HR 63 | Temp 98.5°F | Resp 18 | Ht 67.0 in | Wt 237.2 lb

## 2021-03-12 DIAGNOSIS — D500 Iron deficiency anemia secondary to blood loss (chronic): Secondary | ICD-10-CM

## 2021-03-12 MED ORDER — METHYLPREDNISOLONE SODIUM SUCC 125 MG IJ SOLR: 125.0000 mg | Freq: Once | INTRAMUSCULAR | Status: DC | PRN

## 2021-03-12 MED ORDER — ALBUTEROL SULFATE HFA 108 (90 BASE) MCG/ACT IN AERS: 2.0000 | INHALATION_SPRAY | Freq: Once | RESPIRATORY_TRACT | Status: DC | PRN

## 2021-03-12 MED ORDER — EPINEPHRINE 0.3 MG/0.3ML IJ SOAJ: 0.3000 mg | Freq: Once | INTRAMUSCULAR | Status: DC | PRN

## 2021-03-12 MED ORDER — SODIUM CHLORIDE 0.9 % IV SOLN: Freq: Once | INTRAVENOUS | Status: DC | PRN

## 2021-03-12 MED ORDER — DIPHENHYDRAMINE HCL 25 MG PO CAPS: 50.0000 mg | ORAL_CAPSULE | Freq: Once | ORAL | Status: DC

## 2021-03-12 MED ORDER — ACETAMINOPHEN 325 MG PO TABS: 650.0000 mg | ORAL_TABLET | Freq: Once | ORAL | Status: DC

## 2021-03-12 MED ORDER — FAMOTIDINE IN NACL 20-0.9 MG/50ML-% IV SOLN: 20.0000 mg | Freq: Once | INTRAVENOUS | Status: DC | PRN

## 2021-03-12 MED ORDER — DIPHENHYDRAMINE HCL 50 MG/ML IJ SOLN: 50.0000 mg | Freq: Once | INTRAMUSCULAR | Status: DC | PRN

## 2021-03-12 MED ORDER — SODIUM CHLORIDE 0.9 % IV SOLN
300.0000 mg | Freq: Once | INTRAVENOUS | Status: AC
  Administered 2021-03-12: 300 mg via INTRAVENOUS
  Filled 2021-03-12: qty 15

## 2021-03-12 NOTE — Progress Notes (Signed)
Diagnosis: Iron Deficiency Anemia  Provider:  Marshell Garfinkel, MD  Procedure: Infusion  IV Type: Peripheral, IV Location: R Forearm  Venofer (Iron Sucrose), Dose: 300 mg  Infusion Start Time: 4097  Infusion Stop Time: 3532  Post Infusion IV Care: Peripheral IV Discontinued  Discharge: Condition: Good, Destination: Home . AVS provided to patient.   Performed by:  Koren Shiver, RN

## 2021-03-20 ENCOUNTER — Other Ambulatory Visit (HOSPITAL_COMMUNITY): Payer: Self-pay

## 2021-03-20 MED ORDER — PEG 3350-KCL-NA BICARB-NACL 420 G PO SOLR
ORAL | 0 refills | Status: DC
  Filled 2021-03-20: qty 4000, 1d supply, fill #0

## 2021-03-25 ENCOUNTER — Other Ambulatory Visit (HOSPITAL_COMMUNITY): Payer: Self-pay

## 2021-03-25 ENCOUNTER — Telehealth: Payer: 59 | Admitting: Physician Assistant

## 2021-03-25 DIAGNOSIS — Z20818 Contact with and (suspected) exposure to other bacterial communicable diseases: Secondary | ICD-10-CM

## 2021-03-25 DIAGNOSIS — J029 Acute pharyngitis, unspecified: Secondary | ICD-10-CM | POA: Diagnosis not present

## 2021-03-25 MED ORDER — AMOXICILLIN 500 MG PO CAPS
500.0000 mg | ORAL_CAPSULE | Freq: Two times a day (BID) | ORAL | 0 refills | Status: DC
  Filled 2021-03-25: qty 20, 10d supply, fill #0

## 2021-03-25 NOTE — Progress Notes (Signed)

## 2021-03-25 NOTE — Progress Notes (Signed)
Message sent to patient requesting further input regarding current symptoms. Awaiting patient response.  

## 2021-03-25 NOTE — Progress Notes (Signed)
I have spent 5 minutes in review of e-visit questionnaire, review and updating patient chart, medical decision making and response to patient.   Tina Flores Cody Velita Quirk, PA-C    

## 2021-03-26 DIAGNOSIS — Z1211 Encounter for screening for malignant neoplasm of colon: Secondary | ICD-10-CM | POA: Diagnosis not present

## 2021-03-26 DIAGNOSIS — K648 Other hemorrhoids: Secondary | ICD-10-CM | POA: Diagnosis not present

## 2021-04-08 ENCOUNTER — Telehealth: Payer: Self-pay | Admitting: Physician Assistant

## 2021-04-08 NOTE — Telephone Encounter (Signed)
R/s on 2/7 due to provider sch change, message has been left with pt

## 2021-04-22 ENCOUNTER — Other Ambulatory Visit: Payer: Self-pay | Admitting: Pharmacy Technician

## 2021-04-29 ENCOUNTER — Inpatient Hospital Stay: Payer: 59 | Admitting: Physician Assistant

## 2021-04-29 ENCOUNTER — Inpatient Hospital Stay: Payer: 59

## 2021-04-30 ENCOUNTER — Inpatient Hospital Stay (HOSPITAL_BASED_OUTPATIENT_CLINIC_OR_DEPARTMENT_OTHER): Payer: 59 | Admitting: Physician Assistant

## 2021-04-30 ENCOUNTER — Other Ambulatory Visit: Payer: Self-pay

## 2021-04-30 ENCOUNTER — Inpatient Hospital Stay: Payer: 59 | Attending: Physician Assistant

## 2021-04-30 ENCOUNTER — Other Ambulatory Visit: Payer: Self-pay | Admitting: Physician Assistant

## 2021-04-30 VITALS — BP 130/64 | HR 68 | Temp 97.3°F | Resp 20 | Wt 226.7 lb

## 2021-04-30 DIAGNOSIS — D500 Iron deficiency anemia secondary to blood loss (chronic): Secondary | ICD-10-CM

## 2021-04-30 DIAGNOSIS — D509 Iron deficiency anemia, unspecified: Secondary | ICD-10-CM | POA: Insufficient documentation

## 2021-04-30 DIAGNOSIS — D75839 Thrombocytosis, unspecified: Secondary | ICD-10-CM | POA: Diagnosis not present

## 2021-04-30 LAB — CBC WITH DIFFERENTIAL (CANCER CENTER ONLY)
Abs Immature Granulocytes: 0.02 10*3/uL (ref 0.00–0.07)
Basophils Absolute: 0 10*3/uL (ref 0.0–0.1)
Basophils Relative: 1 %
Eosinophils Absolute: 0.1 10*3/uL (ref 0.0–0.5)
Eosinophils Relative: 1 %
HCT: 37.2 % (ref 36.0–46.0)
Hemoglobin: 11.9 g/dL — ABNORMAL LOW (ref 12.0–15.0)
Immature Granulocytes: 0 %
Lymphocytes Relative: 30 %
Lymphs Abs: 2 10*3/uL (ref 0.7–4.0)
MCH: 26.6 pg (ref 26.0–34.0)
MCHC: 32 g/dL (ref 30.0–36.0)
MCV: 83 fL (ref 80.0–100.0)
Monocytes Absolute: 0.4 10*3/uL (ref 0.1–1.0)
Monocytes Relative: 6 %
Neutro Abs: 4 10*3/uL (ref 1.7–7.7)
Neutrophils Relative %: 62 %
Platelet Count: 272 10*3/uL (ref 150–400)
RBC: 4.48 MIL/uL (ref 3.87–5.11)
RDW: 18.3 % — ABNORMAL HIGH (ref 11.5–15.5)
WBC Count: 6.4 10*3/uL (ref 4.0–10.5)
nRBC: 0 % (ref 0.0–0.2)

## 2021-04-30 LAB — RETIC PANEL
Immature Retic Fract: 10.3 % (ref 2.3–15.9)
RBC.: 4.53 MIL/uL (ref 3.87–5.11)
Retic Count, Absolute: 48 10*3/uL (ref 19.0–186.0)
Retic Ct Pct: 1.1 % (ref 0.4–3.1)
Reticulocyte Hemoglobin: 31.2 pg (ref >27.9)

## 2021-05-01 ENCOUNTER — Telehealth: Payer: Self-pay

## 2021-05-01 LAB — IRON AND IRON BINDING CAPACITY (CC-WL,HP ONLY)
Iron: 60 ug/dL (ref 28–170)
Saturation Ratios: 16 % (ref 10.4–31.8)
TIBC: 375 ug/dL (ref 250–450)
UIBC: 315 ug/dL (ref 148–442)

## 2021-05-01 LAB — FERRITIN: Ferritin: 75 ng/mL (ref 11–307)

## 2021-05-01 NOTE — Telephone Encounter (Signed)
Pt advised with VU 

## 2021-05-01 NOTE — Telephone Encounter (Signed)
-----   Message from Lincoln Brigham, PA-C sent at 05/01/2021 10:57 AM EST ----- ?Please notify patient that iron panel shows that she is no longer deficient. We will see her back in 3 months for repeat labs ?

## 2021-05-01 NOTE — Progress Notes (Signed)
[place] Telephone:(336) [phone]   Fax:(336) [phone]  PROGRESS NOTE  Patient Care Team: Wenda Low, MD as PCP - General (Internal Medicine)  Hematological/Oncological History 1) Labs from PCP, Dr. Wenda Low: -01/02/2021: WBC 7.2, Hgb 9.3 (L), MCV 70.7 (L), Plt 429 (H), Iron 19 (L), Iron saturation 4% (L), TIBC 523 (H).  -01/31/2021: WBC 6.7, Hgb 9.5 (L), MCV 71.2 (L), Plt 405 (H)  2) 02/04/2021: Establish care with [facility] Hematology/Oncology  3) 02/13/2021-03/12/2021: Received IV Venofer 200 mg once a week x 2 doses and then increased to 300 mg x 2 doses due to difficulty IV access.   CHIEF COMPLAINTS/PURPOSE OF CONSULTATION:  Iron deficiency anemia and thrombocytosis  HISTORY OF PRESENTING ILLNESS:  Tina Flores 46 y.o. female returns for follow-up for history of iron deficiency anemia and thrombocytosis.  Patient was last seen on 02/04/2021.  In the interim patient received IV Venofer which she completed on 03/12/2021.  She is unaccompanied for this visit.  On exam today, Ms. Gilberto Better reports her energy levels have improved since receiving IV iron.  She has more a energy and is staying active.  She recently joined a weight loss program.  Due to diet and exercise, patient has lost 9 pounds since January 2023.  She has a good appetite and denies any dietary restrictions.  She denies any nausea, vomiting or abdominal pain.  Her bowel habits are regular without any diarrhea or constipation.  Patient reports that her menstrual cycles are fairly stable lasting approximately 5 days with 2 days of heavy bleeding.  She is scheduled for a follow-up with her gynecologist this month to discuss interventions to help with the heavy menstrual bleeding including surgery.  She denies easy bruising or other signs of bleeding.  Patient no longer craves ice chips.  She denies fevers, chills, night sweats, shortness of breath, chest pain or cough.  She has no other complaints.   Rest of the 10 point ROS is below. 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   HTN (hypertension)    obesity     SURGICAL HISTORY: Past Surgical History:  Procedure Laterality Date   CESAREAN SECTION     x 2   OVARIAN CYST REMOVAL  03/02/2001   left   REFRACTIVE SURGERY     Stomach Intestinal Pylorus Sparing (SIPS)   2016   TONSILLECTOMY     age 38   WISDOM TOOTH EXTRACTION  03/03/2007    SOCIAL HISTORY: Social History   Socioeconomic History   Marital status: Married    Spouse name: Not on file   Number of children: 2   Years of education: Not on file   Highest education level: Not on file  Occupational History   Occupation: -Licensed Diplomatic Services operational officer: [employer]  Tobacco Use   Smoking status: Never   Smokeless tobacco: Never  Substance and Sexual Activity   Alcohol use: Yes    Comment: occasional, once a month   Drug use: No   Sexual activity: Not on file    Comment: BTL  Other Topics Concern   Not on file  Social History Narrative   Not on file   Social Determinants of Health  Financial Resource Strain: Not on file  Food Insecurity: Not on file  Transportation Needs: Not on file  Physical Activity: Not on file  Stress: Not on file  Social Connections: Not on file  Intimate Partner Violence: Not on file    FAMILY HISTORY: Family History  Problem Relation Age of Onset   Asthma Mother    Hyperlipidemia Mother    Hypertension Mother    Asthma Father    Diabetes Father    Hypertension Father    Asthma Sister    Anxiety disorder Sister    Lupus Brother    Heart attack Paternal Grandmother    Uterine cancer Maternal Aunt     ALLERGIES:  has No Known Allergies.  MEDICATIONS:  Current Outpatient  Medications  Medication Sig Dispense Refill   albuterol (PROVENTIL HFA;VENTOLIN HFA) 108 (90 Base) MCG/ACT inhaler Inhale 1-2 puffs into the lungs every 6 (six) hours as needed for wheezing or shortness of breath.     budesonide-formoterol (SYMBICORT) 160-4.5 MCG/ACT inhaler INHALE 2 PUFFS INTO THE LUNGS 2 TIMES DAILY 30.6 g 1   busPIRone (BUSPAR) 5 MG tablet Take one tablet by mouth three times daily. 90 tablet 2   hydrochlorothiazide (MICROZIDE) 12.5 MG capsule TAKE 1 CAPSULE BY MOUTH ONCE DAILY IN THE MORNING 90 capsule 3   metformin (FORTAMET) 500 MG (OSM) 24 hr tablet Take 500 mg by mouth daily with breakfast.     valACYclovir (VALTREX) 500 MG tablet TAKE 1 TABLET BY MOUTH ONCE A DAY 30 tablet 6   albuterol (VENTOLIN HFA) 108 (90 Base) MCG/ACT inhaler INHALE 2 PUFFS INTO THE LUNGS EVERY 4 TO 6 HOURS AS NEEDED 54 g 5   amLODipine-benazepril (LOTREL) 5-20 MG capsule Take 1 capsule by mouth daily. (Patient not taking: Reported on 02/04/2021)     amoxicillin (AMOXIL) 500 MG capsule Take 1 capsule (500 mg total) by mouth 2 (two) times daily. (Patient not taking: Reported on 04/30/2021) 20 capsule 0   budesonide-formoterol (SYMBICORT) 160-4.5 MCG/ACT inhaler Inhale 2 puffs into the lungs 2 (two) times daily. (Patient not taking: Reported on 04/30/2021)     cholestyramine light (PREVALITE) 4 g packet Take 1 packet (4 g total) by mouth 3 (three) times daily before meals. 12 packet 0   fluorometholone (FML) 0.1 % ophthalmic suspension Instill 1 drop into both eyes 4 times a day as directed (Patient not taking: Reported on 02/04/2021) 5 mL 0   Iron-FA-B Cmp-C-Biot-Probiotic (FUSION PLUS) CAPS Take 1 capsule between meals by mouth once daily (Patient not taking: Reported on 02/04/2021) 30 capsule 6   Iron-FA-B Cmp-C-Biot-Probiotic (FUSION PLUS) CAPS Take 1 capsule by mouth between meals twice a day for 2 weeks then back to once a day. (Patient not taking: Reported on 04/30/2021) 30 capsule 6   metFORMIN  (GLUCOPHAGE) 500 MG tablet TAKE 1 TABLET BY MOUTH ONCE A DAY WITH A MEAL 90 tablet 3   methylPREDNISolone (MEDROL) 4 MG TBPK tablet TAKE AS DIRECTED PER PACKAGE DIRECTIONS (Patient not taking: Reported on 02/04/2021) 21 tablet 1   polyethylene glycol-electrolytes (NULYTELY) 420 g solution Used as directed (Patient not taking: Reported on 04/30/2021) 4000 mL 0   potassium chloride SA (K-DUR,KLOR-CON) 20 MEQ tablet Take 20 mEq by mouth daily. (Patient not taking: Reported on 04/30/2021)     spironolactone (ALDACTONE) 50 MG tablet Take 50 mg by mouth daily. (Patient not taking: Reported on 02/04/2021)     tranexamic acid (LYSTEDA) 650 MG TABS tablet TAKE 2 TABLETS BY MOUTH 3  TIMES DAILY FOR UP TO 5 DAYS (Patient not taking: Reported on 02/04/2021) 18 tablet 6   valACYclovir (VALTREX) 500 MG tablet Take 500 mg by mouth daily as needed (for outbreaks).  (Patient not taking: Reported on 04/30/2021)  11   No current facility-administered medications for this visit.    REVIEW OF SYSTEMS:   Constitutional: ( - ) fevers, ( - )  chills , ( - ) night sweats Eyes: ( - ) blurriness of vision, ( - ) double vision, ( - ) watery eyes Ears, nose, mouth, throat, and face: ( - ) mucositis, ( - ) sore throat Respiratory: ( - ) cough, ( - ) dyspnea, ( - ) wheezes Cardiovascular: ( - ) palpitation, ( - ) chest discomfort, ( - ) lower extremity swelling Gastrointestinal:  ( - ) nausea, ( - ) heartburn, ( - ) change in bowel habits Skin: ( - ) abnormal skin rashes Lymphatics: ( - ) new lymphadenopathy, ( - ) easy bruising Neurological: ( - ) numbness, ( - ) tingling, ( - ) new weaknesses Behavioral/Psych: ( - ) mood change, ( - ) new changes  All other systems were reviewed with the patient and are negative.  PHYSICAL EXAMINATION: ECOG PERFORMANCE STATUS: 0 - Asymptomatic  Vitals:   04/30/21 1530  BP: 130/64  Pulse: 68  Resp: 20  Temp: (!) 97.3 F (36.3 C)  SpO2: 100%   Filed Weights   04/30/21 1530  Weight: 226  lb 11.2 oz (102.8 kg)    GENERAL: well appearing African American female in NAD  SKIN: skin color, texture, turgor are normal, no rashes or significant lesions EYES: conjunctiva are pink and non-injected, sclera clear OROPHARYNX: no exudate, no erythema; lips, buccal mucosa, and tongue normal  LUNGS: clear to auscultation and percussion with normal breathing effort HEART: regular rate & rhythm and no murmurs and no lower extremity edema ABDOMEN: soft, non-tender, non-distended, normal bowel sounds Musculoskeletal: no cyanosis of digits and no clubbing  PSYCH: alert & oriented x 3, fluent speech NEURO: no focal motor/sensory deficits  LABORATORY DATA:  I have reviewed the data as listed CBC Latest Ref Rng & Units 04/30/2021 02/04/2021 02/09/2016  WBC 4.0 - 10.5 K/uL 6.4 5.6 4.8  Hemoglobin 12.0 - 15.0 g/dL 11.9(L) 8.7(L) 11.4(L)  Hematocrit 36.0 - 46.0 % 37.2 29.3(L) 32.6(L)  Platelets 150 - 400 K/uL 272 447(H) 317    CMP Latest Ref Rng & Units 02/04/2021 02/09/2016 02/08/2016  Glucose 70 - 99 mg/dL 105(H) 112(H) 117(H)  BUN 6 - 20 mg/dL 10 10 12   Creatinine 0.44 - 1.00 mg/dL 0.94 0.97 1.02(H)  Sodium 135 - 145 mmol/L 139 138 141  Potassium 3.5 - 5.1 mmol/L 3.2(L) 3.4(L) 3.7  Chloride 98 - 111 mmol/L 105 113(H) 116(H)  CO2 22 - 32 mmol/L 25 18(L) 20(L)  Calcium 8.9 - 10.3 mg/dL 8.5(L) 8.3(L) 8.5(L)  Total Protein 6.5 - 8.1 g/dL 7.1 - -  Total Bilirubin 0.3 - 1.2 mg/dL 0.4 - -  Alkaline Phos 38 - 126 U/L 91 - -  AST 15 - 41 U/L 24 - -  ALT 0 - 44 U/L 18 - -    ASSESSMENT & PLAN VERYL ABRIL is a 46 y.o. female who returns to the clinic for iron deficiency anemia and thrombocytosis.   #Iron deficiency anemia: --Multifactorial from heavy menstrual bleeding and malabsorption from weight loss surgery --From 02/13/2021-03/12/2021, she received IV Venofer 200 mg once a week x 2 doses and then  increased to 300 mg x 2 doses due to difficulty IV access.  --Labs today show Hgb has  nearly resolved back to normal measuring 11.9 g/dl. MCV is 83.0. Iron panel shows no evidence of iron deficiency.  --No additional IV iron is needed at this time. --RTC in 3 months with repeat labs.   #Thrombocytosis, resolved: --Likely secondary to iron deficiency anemia --Platelet count is back to normal after receiving IV iron measuring 272 K/uL.    No orders of the defined types were placed in this encounter.   All questions were answered. The patient knows to call the clinic with any problems, questions or concerns.  I have spent a total of 25 minutes minutes of face-to-face and non-face-to-face time, preparing to see the patient,  performing a medically appropriate examination, counseling and educating the patient, ordering tests,documenting clinical information in the electronic health record, and care coordination.    Dede Query, PA-C Department of Hematology/Oncology [place] at [facility] Phone: [phone]

## 2021-05-05 ENCOUNTER — Other Ambulatory Visit: Payer: Self-pay

## 2021-05-05 ENCOUNTER — Other Ambulatory Visit (HOSPITAL_COMMUNITY): Payer: Self-pay

## 2021-05-05 MED FILL — Budesonide-Formoterol Fumarate Dihyd Aerosol 160-4.5 MCG/ACT: RESPIRATORY_TRACT | 30 days supply | Qty: 10.2 | Fill #1 | Status: AC

## 2021-05-08 ENCOUNTER — Other Ambulatory Visit (HOSPITAL_COMMUNITY): Payer: Self-pay

## 2021-05-08 MED ORDER — ALBUTEROL SULFATE HFA 108 (90 BASE) MCG/ACT IN AERS
INHALATION_SPRAY | RESPIRATORY_TRACT | 6 refills | Status: AC
  Filled 2021-05-08: qty 54, 50d supply, fill #0
  Filled 2021-11-05: qty 20.1, 50d supply, fill #1

## 2021-05-09 ENCOUNTER — Other Ambulatory Visit (HOSPITAL_COMMUNITY): Payer: Self-pay

## 2021-07-07 ENCOUNTER — Other Ambulatory Visit (HOSPITAL_COMMUNITY): Payer: Self-pay

## 2021-07-07 DIAGNOSIS — Z0000 Encounter for general adult medical examination without abnormal findings: Secondary | ICD-10-CM | POA: Diagnosis not present

## 2021-07-07 DIAGNOSIS — J4520 Mild intermittent asthma, uncomplicated: Secondary | ICD-10-CM | POA: Diagnosis not present

## 2021-07-07 DIAGNOSIS — Z9884 Bariatric surgery status: Secondary | ICD-10-CM | POA: Diagnosis not present

## 2021-07-07 DIAGNOSIS — Z23 Encounter for immunization: Secondary | ICD-10-CM | POA: Diagnosis not present

## 2021-07-07 DIAGNOSIS — J309 Allergic rhinitis, unspecified: Secondary | ICD-10-CM | POA: Diagnosis not present

## 2021-07-07 DIAGNOSIS — I10 Essential (primary) hypertension: Secondary | ICD-10-CM | POA: Diagnosis not present

## 2021-07-07 DIAGNOSIS — J45909 Unspecified asthma, uncomplicated: Secondary | ICD-10-CM | POA: Diagnosis not present

## 2021-07-07 DIAGNOSIS — D509 Iron deficiency anemia, unspecified: Secondary | ICD-10-CM | POA: Diagnosis not present

## 2021-07-07 DIAGNOSIS — F419 Anxiety disorder, unspecified: Secondary | ICD-10-CM | POA: Diagnosis not present

## 2021-07-07 DIAGNOSIS — E1169 Type 2 diabetes mellitus with other specified complication: Secondary | ICD-10-CM | POA: Diagnosis not present

## 2021-07-07 DIAGNOSIS — E7800 Pure hypercholesterolemia, unspecified: Secondary | ICD-10-CM | POA: Diagnosis not present

## 2021-07-07 MED ORDER — BUDESONIDE-FORMOTEROL FUMARATE 160-4.5 MCG/ACT IN AERO
INHALATION_SPRAY | RESPIRATORY_TRACT | 4 refills | Status: DC
  Filled 2021-07-07 – 2021-07-24 (×2): qty 10.2, 30d supply, fill #0
  Filled 2021-11-05: qty 10.2, 30d supply, fill #1
  Filled 2022-01-27: qty 10.2, 30d supply, fill #2
  Filled 2022-04-27: qty 30.6, 90d supply, fill #3
  Filled 2022-06-10: qty 30.6, 90d supply, fill #4

## 2021-07-07 MED ORDER — METFORMIN HCL 500 MG PO TABS
500.0000 mg | ORAL_TABLET | Freq: Every day | ORAL | 4 refills | Status: AC
  Filled 2021-07-07: qty 90, 90d supply, fill #0

## 2021-07-07 MED ORDER — BUSPIRONE HCL 5 MG PO TABS
5.0000 mg | ORAL_TABLET | Freq: Two times a day (BID) | ORAL | 4 refills | Status: AC
  Filled 2021-07-07 – 2021-07-24 (×2): qty 180, 90d supply, fill #0
  Filled 2021-11-05: qty 180, 90d supply, fill #1
  Filled 2022-01-27: qty 180, 90d supply, fill #2

## 2021-07-07 MED ORDER — ALBUTEROL SULFATE HFA 108 (90 BASE) MCG/ACT IN AERS
INHALATION_SPRAY | RESPIRATORY_TRACT | 4 refills | Status: AC
  Filled 2021-07-07: qty 20.1, 48d supply, fill #0
  Filled 2021-07-24: qty 20.1, 50d supply, fill #0

## 2021-07-07 MED ORDER — HYDROCHLOROTHIAZIDE 12.5 MG PO CAPS
12.5000 mg | ORAL_CAPSULE | Freq: Every morning | ORAL | 4 refills | Status: AC
  Filled 2021-07-07: qty 90, 90d supply, fill #0
  Filled 2021-11-05: qty 90, 90d supply, fill #1
  Filled 2022-01-27: qty 90, 90d supply, fill #2
  Filled 2022-04-27: qty 90, 90d supply, fill #3

## 2021-07-08 ENCOUNTER — Other Ambulatory Visit (HOSPITAL_COMMUNITY): Payer: Self-pay

## 2021-07-15 ENCOUNTER — Other Ambulatory Visit (HOSPITAL_COMMUNITY): Payer: Self-pay

## 2021-07-16 ENCOUNTER — Other Ambulatory Visit (HOSPITAL_COMMUNITY): Payer: Self-pay

## 2021-07-17 DIAGNOSIS — D485 Neoplasm of uncertain behavior of skin: Secondary | ICD-10-CM | POA: Diagnosis not present

## 2021-07-17 DIAGNOSIS — L820 Inflamed seborrheic keratosis: Secondary | ICD-10-CM | POA: Diagnosis not present

## 2021-07-24 ENCOUNTER — Other Ambulatory Visit (HOSPITAL_COMMUNITY): Payer: Self-pay

## 2021-07-24 MED ORDER — VALACYCLOVIR HCL 500 MG PO TABS
500.0000 mg | ORAL_TABLET | Freq: Every day | ORAL | 2 refills | Status: AC
  Filled 2021-07-24: qty 30, 30d supply, fill #0

## 2021-07-31 ENCOUNTER — Other Ambulatory Visit: Payer: Self-pay

## 2021-07-31 ENCOUNTER — Inpatient Hospital Stay: Payer: 59 | Attending: Physician Assistant

## 2021-07-31 ENCOUNTER — Inpatient Hospital Stay (HOSPITAL_BASED_OUTPATIENT_CLINIC_OR_DEPARTMENT_OTHER): Payer: 59 | Admitting: Hematology and Oncology

## 2021-07-31 VITALS — BP 115/78 | HR 95 | Temp 97.9°F | Resp 18 | Ht 67.0 in | Wt 232.0 lb

## 2021-07-31 DIAGNOSIS — D500 Iron deficiency anemia secondary to blood loss (chronic): Secondary | ICD-10-CM

## 2021-07-31 DIAGNOSIS — D75839 Thrombocytosis, unspecified: Secondary | ICD-10-CM

## 2021-07-31 DIAGNOSIS — D509 Iron deficiency anemia, unspecified: Secondary | ICD-10-CM | POA: Insufficient documentation

## 2021-07-31 LAB — CBC WITH DIFFERENTIAL (CANCER CENTER ONLY)
Abs Immature Granulocytes: 0.02 10*3/uL (ref 0.00–0.07)
Basophils Absolute: 0 10*3/uL (ref 0.0–0.1)
Basophils Relative: 0 %
Eosinophils Absolute: 0.1 10*3/uL (ref 0.0–0.5)
Eosinophils Relative: 2 %
HCT: 38 % (ref 36.0–46.0)
Hemoglobin: 12.3 g/dL (ref 12.0–15.0)
Immature Granulocytes: 0 %
Lymphocytes Relative: 27 %
Lymphs Abs: 1.3 10*3/uL (ref 0.7–4.0)
MCH: 28 pg (ref 26.0–34.0)
MCHC: 32.4 g/dL (ref 30.0–36.0)
MCV: 86.6 fL (ref 80.0–100.0)
Monocytes Absolute: 0.3 10*3/uL (ref 0.1–1.0)
Monocytes Relative: 7 %
Neutro Abs: 3 10*3/uL (ref 1.7–7.7)
Neutrophils Relative %: 64 %
Platelet Count: 303 10*3/uL (ref 150–400)
RBC: 4.39 MIL/uL (ref 3.87–5.11)
RDW: 14.8 % (ref 11.5–15.5)
WBC Count: 4.7 10*3/uL (ref 4.0–10.5)
nRBC: 0 % (ref 0.0–0.2)

## 2021-07-31 LAB — IRON AND IRON BINDING CAPACITY (CC-WL,HP ONLY)
Iron: 70 ug/dL (ref 28–170)
Saturation Ratios: 17 % (ref 10.4–31.8)
TIBC: 402 ug/dL (ref 250–450)
UIBC: 332 ug/dL (ref 148–442)

## 2021-07-31 NOTE — Progress Notes (Signed)
[place] Telephone:(336) [phone]   Fax:(336) [phone]  PROGRESS NOTE  Patient Care Team: Wenda Low, MD as PCP - General (Internal Medicine)  Hematological/Oncological History  # Iron deficiency anemia and thrombocytosis 1) Labs from PCP, Dr. Wenda Low: -01/02/2021: WBC 7.2, Hgb 9.3 (L), MCV 70.7 (L), Plt 429 (H), Iron 19 (L), Iron saturation 4% (L), TIBC 523 (H).  -01/31/2021: WBC 6.7, Hgb 9.5 (L), MCV 71.2 (L), Plt 405 (H)  2) 02/04/2021: Establish care with [facility] Hematology/Oncology  3) 02/13/2021-03/12/2021: Received IV Venofer 200 mg once a week x 2 doses and then increased to 300 mg x 2 doses due to difficulty IV access.   HISTORY OF PRESENTING ILLNESS:  Tina Flores 46 y.o. female returns for follow-up for history of iron deficiency anemia and thrombocytosis.  Patient was last seen on 04/30/2021.  In the interim patient has had no major changes in her health.  On exam today, Tina Flores reports her energy levels have been quite good.  She reports her energy has been 8 or 9 out of 10.  She has not been having any ice cravings and she is been sleeping quite well.  She notes that she engaged in a weight loss challenge and was attempting to lose as much weight as she could and a 30-day stretch.  Her weight dropped down to 226 but unfortunately rebounded today to 232.  Overall she feels well.  She denies fevers, chills, night sweats, shortness of breath, chest pain or cough.  She has no other complaints.  Rest of the 10 point ROS is below.  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   HTN (hypertension)    obesity     SURGICAL HISTORY: Past Surgical History:  Procedure Laterality Date   CESAREAN SECTION     x 2   OVARIAN CYST  REMOVAL  03/02/2001   left   REFRACTIVE SURGERY     Stomach Intestinal Pylorus Sparing (SIPS)   2016   TONSILLECTOMY     age 29   WISDOM TOOTH EXTRACTION  03/03/2007    SOCIAL HISTORY: Social History   Socioeconomic History   Marital status: Married    Spouse name: Not on file   Number of children: 2   Years of education: Not on file   Highest education level: Not on file  Occupational History   Occupation: -Licensed Diplomatic Services operational officer: [employer]  Tobacco Use   Smoking status: Never   Smokeless tobacco: Never  Substance and Sexual Activity   Alcohol use: Yes    Comment: occasional, once a month   Drug use: No   Sexual activity: Not on file    Comment: BTL  Other Topics Concern   Not on file  Social History Narrative   Not on file   Social Determinants of Health   Financial Resource Strain: Not on file  Food Insecurity: Not on file  Transportation Needs: Not on file  Physical Activity: Not on file  Stress: Not on file  Social Connections: Not on file  Intimate Partner Violence: Not on file    FAMILY HISTORY: Family History  Problem Relation Age of Onset   Asthma Mother  Hyperlipidemia Mother    Hypertension Mother    Asthma Father    Diabetes Father    Hypertension Father    Asthma Sister    Anxiety disorder Sister    Lupus Brother    Heart attack Paternal Grandmother    Uterine cancer Maternal Aunt     ALLERGIES:  has No Known Allergies.  MEDICATIONS:  Current Outpatient Medications  Medication Sig Dispense Refill   albuterol (PROVENTIL HFA;VENTOLIN HFA) 108 (90 Base) MCG/ACT inhaler Inhale 1-2 puffs into the lungs every 6 (six) hours as needed for wheezing or shortness of breath.     albuterol (VENTOLIN HFA) 108 (90 Base) MCG/ACT inhaler INHALE 2 PUFFS INTO THE LUNGS EVERY 4 TO 6 HOURS AS NEEDED 54 g 5   albuterol (VENTOLIN HFA) 108 (90 Base) MCG/ACT inhaler INHALE 2 PUFFS INTO THE LUNGS EVERY 4 TO 6 HOURS AS NEEDED 54 g 6    albuterol (VENTOLIN HFA) 108 (90 Base) MCG/ACT inhaler INHALE 2 PUFFS INTO THE LUNGS EVERY 4 TO 6 HOURS AS NEEDED 20.1 g 4   budesonide-formoterol (SYMBICORT) 160-4.5 MCG/ACT inhaler INHALE 2 PUFFS INTO THE LUNGS 2 TIMES DAILY 30.6 g 1   budesonide-formoterol (SYMBICORT) 160-4.5 MCG/ACT inhaler INHALE 2 PUFFS TWICE A DAY INTO THE LUNGS 30.6 g 4   busPIRone (BUSPAR) 5 MG tablet Take 1 tablet (5 mg total) by mouth 2 (two) times daily. 180 tablet 4   hydrochlorothiazide (MICROZIDE) 12.5 MG capsule TAKE 1 CAPSULE BY MOUTH ONCE DAILY IN THE MORNING 90 capsule 3   hydrochlorothiazide (MICROZIDE) 12.5 MG capsule Take 1 capsule (12.5 mg total) by mouth every morning. 90 capsule 4   metformin (FORTAMET) 500 MG (OSM) 24 hr tablet Take 500 mg by mouth daily with breakfast.     metFORMIN (GLUCOPHAGE) 500 MG tablet Take 1 tablet (500 mg total) by mouth daily with a meal 90 tablet 4   valACYclovir (VALTREX) 500 MG tablet TAKE 1 TABLET BY MOUTH ONCE A DAY 30 tablet 2   No current facility-administered medications for this visit.    REVIEW OF SYSTEMS:   Constitutional: ( - ) fevers, ( - )  chills , ( - ) night sweats Eyes: ( - ) blurriness of vision, ( - ) double vision, ( - ) watery eyes Ears, nose, mouth, throat, and face: ( - ) mucositis, ( - ) sore throat Respiratory: ( - ) cough, ( - ) dyspnea, ( - ) wheezes Cardiovascular: ( - ) palpitation, ( - ) chest discomfort, ( - ) lower extremity swelling Gastrointestinal:  ( - ) nausea, ( - ) heartburn, ( - ) change in bowel habits Skin: ( - ) abnormal skin rashes Lymphatics: ( - ) new lymphadenopathy, ( - ) easy bruising Neurological: ( - ) numbness, ( - ) tingling, ( - ) new weaknesses Behavioral/Psych: ( - ) mood change, ( - ) new changes  All other systems were reviewed with the patient and are negative.  PHYSICAL EXAMINATION: ECOG PERFORMANCE STATUS: 0 - Asymptomatic  Vitals:   07/31/21 1530  BP: 115/78  Pulse: 95  Resp: 18  Temp: 97.9 F (36.6  C)  SpO2: 100%    Filed Weights   07/31/21 1530  Weight: 232 lb (105.2 kg)    GENERAL: well appearing African American female in NAD  SKIN: skin color, texture, turgor are normal, no rashes or significant lesions EYES: conjunctiva are pink and non-injected, sclera clear OROPHARYNX: no exudate, no erythema; lips, buccal mucosa, and tongue  normal  LUNGS: clear to auscultation and percussion with normal breathing effort HEART: regular rate & rhythm and no murmurs and no lower extremity edema ABDOMEN: soft, non-tender, non-distended, normal bowel sounds Musculoskeletal: no cyanosis of digits and no clubbing  PSYCH: alert & oriented x 3, fluent speech NEURO: no focal motor/sensory deficits  LABORATORY DATA:  I have reviewed the data as listed    Latest Ref Rng & Units 07/31/2021    2:57 PM 04/30/2021    4:20 PM 02/04/2021   10:10 AM  CBC  WBC 4.0 - 10.5 K/uL 4.7   6.4   5.6    Hemoglobin 12.0 - 15.0 g/dL 12.3   11.9   8.7    Hematocrit 36.0 - 46.0 % 38.0   37.2   29.3    Platelets 150 - 400 K/uL 303   272   447         Latest Ref Rng & Units 02/04/2021   10:10 AM 02/09/2016    5:46 AM 02/08/2016    5:46 AM  CMP  Glucose 70 - 99 mg/dL 105   112   117    BUN 6 - 20 mg/dL '10   10   12    '$ Creatinine 0.44 - 1.00 mg/dL 0.94   0.97   1.02    Sodium 135 - 145 mmol/L 139   138   141    Potassium 3.5 - 5.1 mmol/L 3.2   3.4   3.7    Chloride 98 - 111 mmol/L 105   113   116    CO2 22 - 32 mmol/L '25   18   20    '$ Calcium 8.9 - 10.3 mg/dL 8.5   8.3   8.5    Total Protein 6.5 - 8.1 g/dL 7.1      Total Bilirubin 0.3 - 1.2 mg/dL 0.4      Alkaline Phos 38 - 126 U/L 91      AST 15 - 41 U/L 24      ALT 0 - 44 U/L 18        ASSESSMENT & PLAN Tina Flores is a 46 y.o. female who returns to the clinic for iron deficiency anemia and thrombocytosis.   #Iron deficiency anemia: --Multifactorial from heavy menstrual bleeding and malabsorption from weight loss surgery --From  02/13/2021-03/12/2021, she received IV Venofer 200 mg once a week x 2 doses and then increased to 300 mg x 2 doses due to difficulty IV access.  --Labs today show Hgb has nearly resolved back to normal measuring 12.3 g/dl. MCV is 86.6. Iron panel shows no evidence of iron deficiency.  --No additional IV iron is needed at this time. --RTC in 3 months with repeat labs.   #Thrombocytosis, resolved: --Likely secondary to iron deficiency anemia --Platelet count is back to normal after receiving IV iron  No orders of the defined types were placed in this encounter.  All questions were answered. The patient knows to call the clinic with any problems, questions or concerns.  I have spent a total of 30 minutes minutes of face-to-face and non-face-to-face time, preparing to see the patient,  performing a medically appropriate examination, counseling and educating the patient, ordering tests,documenting clinical information in the electronic health record, and care coordination.   Ledell Peoples, MD Department of Hematology/Oncology [place] at [facility] Phone: [phone] Pager: [phone] Email: [email or web address]

## 2021-08-01 LAB — FERRITIN: Ferritin: 21 ng/mL (ref 11–307)

## 2021-09-03 DIAGNOSIS — B009 Herpesviral infection, unspecified: Secondary | ICD-10-CM | POA: Diagnosis not present

## 2021-09-03 DIAGNOSIS — N920 Excessive and frequent menstruation with regular cycle: Secondary | ICD-10-CM | POA: Diagnosis not present

## 2021-09-03 DIAGNOSIS — Z01419 Encounter for gynecological examination (general) (routine) without abnormal findings: Secondary | ICD-10-CM | POA: Diagnosis not present

## 2021-09-09 ENCOUNTER — Other Ambulatory Visit (HOSPITAL_COMMUNITY): Payer: Self-pay

## 2021-09-09 MED ORDER — AMOXICILLIN-POT CLAVULANATE 875-125 MG PO TABS
ORAL_TABLET | ORAL | 1 refills | Status: AC
  Filled 2021-09-09: qty 20, 10d supply, fill #0

## 2021-09-23 ENCOUNTER — Other Ambulatory Visit (HOSPITAL_COMMUNITY): Payer: Self-pay

## 2021-09-23 ENCOUNTER — Telehealth: Payer: 59 | Admitting: Physician Assistant

## 2021-09-23 DIAGNOSIS — B379 Candidiasis, unspecified: Secondary | ICD-10-CM | POA: Diagnosis not present

## 2021-09-23 DIAGNOSIS — T3695XA Adverse effect of unspecified systemic antibiotic, initial encounter: Secondary | ICD-10-CM | POA: Diagnosis not present

## 2021-09-23 MED ORDER — FLUCONAZOLE 150 MG PO TABS
150.0000 mg | ORAL_TABLET | Freq: Once | ORAL | 0 refills | Status: AC
  Filled 2021-09-23: qty 1, 1d supply, fill #0

## 2021-09-23 NOTE — Progress Notes (Signed)
I have spent 5 minutes in review of e-visit questionnaire, review and updating patient chart, medical decision making and response to patient.   Tasean Mancha Cody Florena Kozma, PA-C    

## 2021-09-23 NOTE — Progress Notes (Signed)

## 2021-09-26 ENCOUNTER — Other Ambulatory Visit (HOSPITAL_COMMUNITY): Payer: Self-pay

## 2021-10-02 DIAGNOSIS — F411 Generalized anxiety disorder: Secondary | ICD-10-CM | POA: Diagnosis not present

## 2021-10-16 ENCOUNTER — Other Ambulatory Visit (HOSPITAL_COMMUNITY): Payer: Self-pay

## 2021-10-16 MED ORDER — IBUPROFEN 400 MG PO TABS
ORAL_TABLET | ORAL | 0 refills | Status: AC
  Filled 2021-10-16: qty 16, 4d supply, fill #0

## 2021-10-23 DIAGNOSIS — F411 Generalized anxiety disorder: Secondary | ICD-10-CM | POA: Diagnosis not present

## 2021-11-05 ENCOUNTER — Other Ambulatory Visit (HOSPITAL_COMMUNITY): Payer: Self-pay

## 2021-11-06 ENCOUNTER — Other Ambulatory Visit (HOSPITAL_COMMUNITY): Payer: Self-pay

## 2021-11-10 ENCOUNTER — Other Ambulatory Visit (HOSPITAL_COMMUNITY): Payer: Self-pay

## 2021-11-10 MED ORDER — HYDROQUINONE 4 % EX CREA
TOPICAL_CREAM | CUTANEOUS | 0 refills | Status: AC
  Filled 2021-11-10: qty 28.4, 15d supply, fill #0

## 2021-11-11 ENCOUNTER — Other Ambulatory Visit (HOSPITAL_COMMUNITY): Payer: Self-pay

## 2021-11-12 ENCOUNTER — Other Ambulatory Visit (HOSPITAL_COMMUNITY): Payer: Self-pay

## 2021-11-13 DIAGNOSIS — F411 Generalized anxiety disorder: Secondary | ICD-10-CM | POA: Diagnosis not present

## 2021-11-19 DIAGNOSIS — Z0184 Encounter for antibody response examination: Secondary | ICD-10-CM | POA: Diagnosis not present

## 2021-11-19 DIAGNOSIS — Z23 Encounter for immunization: Secondary | ICD-10-CM | POA: Diagnosis not present

## 2021-11-19 DIAGNOSIS — Z789 Other specified health status: Secondary | ICD-10-CM | POA: Diagnosis not present

## 2021-11-25 DIAGNOSIS — Z23 Encounter for immunization: Secondary | ICD-10-CM | POA: Diagnosis not present

## 2021-11-27 DIAGNOSIS — F411 Generalized anxiety disorder: Secondary | ICD-10-CM | POA: Diagnosis not present

## 2021-12-04 DIAGNOSIS — Z630 Problems in relationship with spouse or partner: Secondary | ICD-10-CM | POA: Diagnosis not present

## 2021-12-25 ENCOUNTER — Other Ambulatory Visit (HOSPITAL_COMMUNITY): Payer: Self-pay

## 2021-12-25 DIAGNOSIS — L280 Lichen simplex chronicus: Secondary | ICD-10-CM | POA: Diagnosis not present

## 2021-12-25 DIAGNOSIS — L810 Postinflammatory hyperpigmentation: Secondary | ICD-10-CM | POA: Diagnosis not present

## 2021-12-25 MED ORDER — CLOBETASOL PROPIONATE 0.05 % EX OINT
1.0000 | TOPICAL_OINTMENT | Freq: Two times a day (BID) | CUTANEOUS | 0 refills | Status: AC
  Filled 2021-12-25: qty 45, 30d supply, fill #0

## 2021-12-30 DIAGNOSIS — R531 Weakness: Secondary | ICD-10-CM | POA: Diagnosis not present

## 2021-12-30 DIAGNOSIS — R109 Unspecified abdominal pain: Secondary | ICD-10-CM | POA: Diagnosis not present

## 2021-12-30 DIAGNOSIS — I10 Essential (primary) hypertension: Secondary | ICD-10-CM | POA: Diagnosis not present

## 2021-12-30 DIAGNOSIS — D509 Iron deficiency anemia, unspecified: Secondary | ICD-10-CM | POA: Diagnosis not present

## 2021-12-30 DIAGNOSIS — E1169 Type 2 diabetes mellitus with other specified complication: Secondary | ICD-10-CM | POA: Diagnosis not present

## 2022-01-01 DIAGNOSIS — F411 Generalized anxiety disorder: Secondary | ICD-10-CM | POA: Diagnosis not present

## 2022-01-27 ENCOUNTER — Other Ambulatory Visit (HOSPITAL_COMMUNITY): Payer: Self-pay

## 2022-01-28 ENCOUNTER — Other Ambulatory Visit (HOSPITAL_COMMUNITY): Payer: Self-pay

## 2022-01-28 DIAGNOSIS — M25552 Pain in left hip: Secondary | ICD-10-CM | POA: Diagnosis not present

## 2022-01-28 MED ORDER — MELOXICAM 15 MG PO TABS
15.0000 mg | ORAL_TABLET | Freq: Every day | ORAL | 0 refills | Status: AC
  Filled 2022-01-28: qty 30, 30d supply, fill #0

## 2022-01-30 ENCOUNTER — Other Ambulatory Visit: Payer: 59

## 2022-01-30 ENCOUNTER — Ambulatory Visit: Payer: 59 | Admitting: Hematology and Oncology

## 2022-02-04 ENCOUNTER — Other Ambulatory Visit: Payer: Self-pay | Admitting: Internal Medicine

## 2022-02-04 DIAGNOSIS — Z1231 Encounter for screening mammogram for malignant neoplasm of breast: Secondary | ICD-10-CM

## 2022-02-05 DIAGNOSIS — M25552 Pain in left hip: Secondary | ICD-10-CM | POA: Diagnosis not present

## 2022-03-04 ENCOUNTER — Other Ambulatory Visit: Payer: Self-pay | Admitting: Hematology and Oncology

## 2022-03-04 DIAGNOSIS — D500 Iron deficiency anemia secondary to blood loss (chronic): Secondary | ICD-10-CM

## 2022-03-05 ENCOUNTER — Other Ambulatory Visit: Payer: Self-pay

## 2022-03-05 ENCOUNTER — Inpatient Hospital Stay: Payer: 59 | Attending: Nurse Practitioner

## 2022-03-05 ENCOUNTER — Ambulatory Visit (HOSPITAL_COMMUNITY): Payer: 59

## 2022-03-05 ENCOUNTER — Inpatient Hospital Stay (HOSPITAL_BASED_OUTPATIENT_CLINIC_OR_DEPARTMENT_OTHER): Payer: 59 | Admitting: Hematology and Oncology

## 2022-03-05 VITALS — BP 118/86 | HR 73 | Temp 98.0°F | Resp 14

## 2022-03-05 DIAGNOSIS — D500 Iron deficiency anemia secondary to blood loss (chronic): Secondary | ICD-10-CM

## 2022-03-05 DIAGNOSIS — D509 Iron deficiency anemia, unspecified: Secondary | ICD-10-CM | POA: Insufficient documentation

## 2022-03-05 DIAGNOSIS — M79605 Pain in left leg: Secondary | ICD-10-CM | POA: Diagnosis not present

## 2022-03-05 DIAGNOSIS — D75839 Thrombocytosis, unspecified: Secondary | ICD-10-CM | POA: Insufficient documentation

## 2022-03-05 LAB — CMP (CANCER CENTER ONLY)
ALT: 14 U/L (ref 0–44)
AST: 18 U/L (ref 15–41)
Albumin: 4.1 g/dL (ref 3.5–5.0)
Alkaline Phosphatase: 119 U/L (ref 38–126)
Anion gap: 7 (ref 5–15)
BUN: 11 mg/dL (ref 6–20)
CO2: 31 mmol/L (ref 22–32)
Calcium: 9.2 mg/dL (ref 8.9–10.3)
Chloride: 100 mmol/L (ref 98–111)
Creatinine: 0.91 mg/dL (ref 0.44–1.00)
GFR, Estimated: >60 mL/min (ref >60)
Glucose, Bld: 125 mg/dL — ABNORMAL HIGH (ref 70–99)
Potassium: 3.3 mmol/L — ABNORMAL LOW (ref 3.5–5.1)
Sodium: 138 mmol/L (ref 135–145)
Total Bilirubin: 0.3 mg/dL (ref 0.3–1.2)
Total Protein: 7.1 g/dL (ref 6.5–8.1)

## 2022-03-05 LAB — CBC WITH DIFFERENTIAL (CANCER CENTER ONLY)
Abs Immature Granulocytes: 0.02 10*3/uL (ref 0.00–0.07)
Basophils Absolute: 0 10*3/uL (ref 0.0–0.1)
Basophils Relative: 0 %
Eosinophils Absolute: 0.2 10*3/uL (ref 0.0–0.5)
Eosinophils Relative: 2 %
HCT: 37.5 % (ref 36.0–46.0)
Hemoglobin: 12.2 g/dL (ref 12.0–15.0)
Immature Granulocytes: 0 %
Lymphocytes Relative: 35 %
Lymphs Abs: 2.3 10*3/uL (ref 0.7–4.0)
MCH: 27.2 pg (ref 26.0–34.0)
MCHC: 32.5 g/dL (ref 30.0–36.0)
MCV: 83.7 fL (ref 80.0–100.0)
Monocytes Absolute: 0.4 10*3/uL (ref 0.1–1.0)
Monocytes Relative: 6 %
Neutro Abs: 3.7 10*3/uL (ref 1.7–7.7)
Neutrophils Relative %: 57 %
Platelet Count: 318 10*3/uL (ref 150–400)
RBC: 4.48 MIL/uL (ref 3.87–5.11)
RDW: 14.4 % (ref 11.5–15.5)
WBC Count: 6.6 10*3/uL (ref 4.0–10.5)
nRBC: 0 % (ref 0.0–0.2)

## 2022-03-05 LAB — RETIC PANEL
Immature Retic Fract: 14.3 % (ref 2.3–15.9)
RBC.: 4.61 MIL/uL (ref 3.87–5.11)
Retic Count, Absolute: 60.9 10*3/uL (ref 19.0–186.0)
Retic Ct Pct: 1.3 % (ref 0.4–3.1)
Reticulocyte Hemoglobin: 28 pg (ref >27.9)

## 2022-03-05 LAB — IRON AND IRON BINDING CAPACITY (CC-WL,HP ONLY)
Iron: 45 ug/dL (ref 28–170)
Saturation Ratios: 11 % (ref 10.4–31.8)
TIBC: 426 ug/dL (ref 250–450)
UIBC: 381 ug/dL (ref 148–442)

## 2022-03-05 LAB — FERRITIN: Ferritin: 12 ng/mL (ref 11–307)

## 2022-03-05 NOTE — Progress Notes (Signed)
[place] [phone]   [phone]  PROGRESS NOTE  Patient Care Team: Wenda Low, MD as PCP - General (Internal Medicine)  Hematological/Oncological History  # Iron deficiency anemia and thrombocytosis 1) Labs from PCP, Dr. Wenda Low: -01/02/2021: WBC 7.2, Hgb 9.3 (L), MCV 70.7 (L), Plt 429 (H), Iron 19 (L), Iron saturation 4% (L), TIBC 523 (H).  -01/31/2021: WBC 6.7, Hgb 9.5 (L), MCV 71.2 (L), Plt 405 (H)  2) 02/04/2021: Establish care with [facility] Hematology/Oncology  3) 02/13/2021-03/12/2021: Received IV Venofer 200 mg once a week x 2 doses and then increased to 300 mg x 2 doses due to difficulty IV access.   4) 03/15/2022: WBC 6.6, Hgb 12.2, MCV 83.7, Plt 318  HISTORY OF PRESENTING ILLNESS:  Tina Flores 47 y.o. female returns for follow-up for history of iron deficiency anemia and thrombocytosis.  Patient was last seen on 07/31/2021.  In the interim patient has had no major changes in her health.  On exam today, Tina Flores reports she has been well overall in the interim since her last visit.  She reports she did have a slump in her energy in November but for the most part feels good.  She notes she is not having any further ice cravings and is not taking any iron pills.  She is doing her best to try to eat red meat approximately 3-4 times per week.  She currently ranks her energy as a 6 out of 10 but was in [place] for Christmas and is still jet lag.  She notes it was a 12-hour flight to [place] and then another 2 hours to [place].  She is developed some pain and tenderness in her left lower extremity.  She notes that she was stretching and wearing compression socks during the long flight.  Overall she feels well.  She denies fevers, chills, night sweats, shortness of breath, chest pain or cough.  She has no other complaints.  Rest of the 10 point ROS is below.  On further discussion she notes that since her hemoglobin levels are  normal she would be okay with following with her primary care provider and being we referred in the event she were to develop new or worsening anemia.   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   HTN (hypertension)    obesity     SURGICAL HISTORY: Past Surgical History:  Procedure Laterality Date   CESAREAN SECTION     x 2   OVARIAN CYST REMOVAL  03/02/2001   left   REFRACTIVE SURGERY     Stomach Intestinal Pylorus Sparing (SIPS)   2016   TONSILLECTOMY     age 20   WISDOM TOOTH EXTRACTION  03/03/2007    SOCIAL HISTORY: Social History   Socioeconomic History   Marital status: Married    Spouse name: Not on file   Number of children: 2   Years of education: Not on file   Highest education level: Not on file  Occupational History   Occupation: -Licensed Diplomatic Services operational officer: [employer]  Tobacco Use   Smoking status: Never   Smokeless tobacco: Never  Substance and Sexual Activity   Alcohol use: Yes  Comment: occasional, once a month   Drug use: No   Sexual activity: Not on file    Comment: BTL  Other Topics Concern   Not on file  Social History Narrative   Not on file   Social Determinants of Health   Financial Resource Strain: Not on file  Food Insecurity: Not on file  Transportation Needs: Not on file  Physical Activity: Not on file  Stress: Not on file  Social Connections: Not on file  Intimate Partner Violence: Not on file    FAMILY HISTORY: Family History  Problem Relation Age of Onset   Asthma Mother    Hyperlipidemia Mother    Hypertension Mother    Asthma Father    Diabetes Father    Hypertension Father    Asthma Sister    Anxiety disorder Sister    Lupus Brother    Heart attack Paternal Grandmother     Uterine cancer Maternal Aunt     ALLERGIES:  has No Known Allergies.  MEDICATIONS:  Current Outpatient Medications  Medication Sig Dispense Refill   albuterol (PROVENTIL HFA;VENTOLIN HFA) 108 (90 Base) MCG/ACT inhaler Inhale 1-2 puffs into the lungs every 6 (six) hours as needed for wheezing or shortness of breath.     albuterol (VENTOLIN HFA) 108 (90 Base) MCG/ACT inhaler INHALE 2 PUFFS INTO THE LUNGS EVERY 4 TO 6 HOURS AS NEEDED 54 g 5   albuterol (VENTOLIN HFA) 108 (90 Base) MCG/ACT inhaler INHALE 2 PUFFS INTO THE LUNGS EVERY 4 TO 6 HOURS AS NEEDED 54 g 6   albuterol (VENTOLIN HFA) 108 (90 Base) MCG/ACT inhaler INHALE 2 PUFFS INTO THE LUNGS EVERY 4 TO 6 HOURS AS NEEDED 20.1 g 4   amoxicillin-clavulanate (AUGMENTIN) 875-125 MG tablet TAKE 1 TABLET BY MOUTH TWICE DAILY UNTIL GONE. 20 tablet 1   budesonide-formoterol (SYMBICORT) 160-4.5 MCG/ACT inhaler INHALE 2 PUFFS INTO THE LUNGS 2 TIMES DAILY 30.6 g 1   budesonide-formoterol (SYMBICORT) 160-4.5 MCG/ACT inhaler INHALE 2 PUFFS TWICE A DAY INTO THE LUNGS 30.6 g 4   busPIRone (BUSPAR) 5 MG tablet Take 1 tablet (5 mg total) by mouth 2 (two) times daily. 180 tablet 4   clobetasol ointment (TEMOVATE) 1.09 % Apply 1 Application (a small amount) topically 2 (two) times daily. 45 g 0   hydrochlorothiazide (MICROZIDE) 12.5 MG capsule TAKE 1 CAPSULE BY MOUTH ONCE DAILY IN THE MORNING 90 capsule 3   hydrochlorothiazide (MICROZIDE) 12.5 MG capsule Take 1 capsule (12.5 mg total) by mouth every morning. 90 capsule 4   hydroquinone 4 % cream Apply a small amount to affected area (dark spots) twice a day 28.4 g 0   ibuprofen (ADVIL) 400 MG tablet Take 1 to 2 tablets by mouth 3 to 4 times per day as needed for pain. 16 tablet 0   meloxicam (MOBIC) 15 MG tablet Take 1 tablet (15 mg total) by mouth daily. 30 tablet 0   metformin (FORTAMET) 500 MG (OSM) 24 hr tablet Take 500 mg by mouth daily with breakfast.     metFORMIN (GLUCOPHAGE) 500 MG tablet Take 1 tablet  (500 mg total) by mouth daily with a meal 90 tablet 4   valACYclovir (VALTREX) 500 MG tablet TAKE 1 TABLET BY MOUTH ONCE A DAY 30 tablet 2   No current facility-administered medications for this visit.    REVIEW OF SYSTEMS:   Constitutional: ( - ) fevers, ( - )  chills , ( - ) night sweats Eyes: ( - ) blurriness of  vision, ( - ) double vision, ( - ) watery eyes Ears, nose, mouth, throat, and face: ( - ) mucositis, ( - ) sore throat Respiratory: ( - ) cough, ( - ) dyspnea, ( - ) wheezes Cardiovascular: ( - ) palpitation, ( - ) chest discomfort, ( - ) lower extremity swelling Gastrointestinal:  ( - ) nausea, ( - ) heartburn, ( - ) change in bowel habits Skin: ( - ) abnormal skin rashes Lymphatics: ( - ) new lymphadenopathy, ( - ) easy bruising Neurological: ( - ) numbness, ( - ) tingling, ( - ) new weaknesses Behavioral/Psych: ( - ) mood change, ( - ) new changes  All other systems were reviewed with the patient and are negative.  PHYSICAL EXAMINATION: ECOG PERFORMANCE STATUS: 0 - Asymptomatic  Vitals:   03/05/22 0842  BP: 118/86  Pulse: 73  Resp: 14  Temp: 98 F (36.7 C)  SpO2: 100%    There were no vitals filed for this visit.   GENERAL: well appearing African American female in NAD  SKIN: skin color, texture, turgor are normal, no rashes or significant lesions EYES: conjunctiva are pink and non-injected, sclera clear OROPHARYNX: no exudate, no erythema; lips, buccal mucosa, and tongue normal  LUNGS: clear to auscultation and percussion with normal breathing effort HEART: regular rate & rhythm and no murmurs and no lower extremity edema ABDOMEN: soft, non-tender, non-distended, normal bowel sounds Musculoskeletal: no cyanosis of digits and no clubbing  PSYCH: alert & oriented x 3, fluent speech NEURO: no focal motor/sensory deficits  LABORATORY DATA:  I have reviewed the data as listed    Latest Ref Rng & Units 03/05/2022    8:25 AM 07/31/2021    2:57 PM 04/30/2021     4:20 PM  CBC  WBC 4.0 - 10.5 K/uL 6.6  4.7  6.4   Hemoglobin 12.0 - 15.0 g/dL 12.2  12.3  11.9   Hematocrit 36.0 - 46.0 % 37.5  38.0  37.2   Platelets 150 - 400 K/uL 318  303  272        Latest Ref Rng & Units 03/05/2022    8:25 AM 02/04/2021   10:10 AM 02/09/2016    5:46 AM  CMP  Glucose 70 - 99 mg/dL 125  105  112   BUN 6 - 20 mg/dL '11  10  10   '$ Creatinine 0.44 - 1.00 mg/dL 0.91  0.94  0.97   Sodium 135 - 145 mmol/L 138  139  138   Potassium 3.5 - 5.1 mmol/L 3.3  3.2  3.4   Chloride 98 - 111 mmol/L 100  105  113   CO2 22 - 32 mmol/L '31  25  18   '$ Calcium 8.9 - 10.3 mg/dL 9.2  8.5  8.3   Total Protein 6.5 - 8.1 g/dL 7.1  7.1    Total Bilirubin 0.3 - 1.2 mg/dL 0.3  0.4    Alkaline Phos 38 - 126 U/L 119  91    AST 15 - 41 U/L 18  24    ALT 0 - 44 U/L 14  18      ASSESSMENT & PLAN Tina Flores is a 47 y.o. female who returns to the clinic for iron deficiency anemia and thrombocytosis.   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  --Labs today show white blood cell 6.6, hemoglobin 12.2, MCV 83.7, and platelets of 318 --No additional IV iron is needed at this time. --RTC PRN.  She notes PCP will continue to monitor her iron levels and will we refer in the event she would develop new or worsening anemia.  #Left Leg Pain -- Will order ultrasound to rule out blood clot in setting of recent travel from [place].  #Thrombocytosis, resolved: --Likely secondary to iron deficiency anemia --Platelet count is back to normal after receiving IV iron  No orders of the defined types were placed in this encounter.  All questions were answered. The patient knows to call the clinic with any problems, questions or concerns.  I have spent a total of  25 minutes minutes of face-to-face and non-face-to-face time, preparing to see the patient,  performing a medically appropriate examination, counseling and educating the patient, ordering tests,documenting clinical information in the electronic health record, and care coordination.   Ledell Peoples, MD Department of Hematology/Oncology [place] [facility] Phone: [phone] Pager: [phone] Email: [email or web address]

## 2022-03-06 ENCOUNTER — Ambulatory Visit (HOSPITAL_COMMUNITY)
Admission: RE | Admit: 2022-03-06 | Discharge: 2022-03-06 | Disposition: A | Discharge status: Home / Self Care | Payer: 59 | Source: Ambulatory Visit | Attending: Hematology and Oncology | Admitting: Hematology and Oncology

## 2022-03-06 DIAGNOSIS — M79605 Pain in left leg: Secondary | ICD-10-CM | POA: Insufficient documentation

## 2022-03-10 ENCOUNTER — Telehealth: Payer: Self-pay

## 2022-03-10 NOTE — Telephone Encounter (Addendum)
Called patient to advise of message below.  ----- Message from Otila Kluver, RN sent at 03/09/2022  5:05 PM EST -----  ----- Message ----- From: Orson Slick, MD Sent: 03/08/2022   8:25 PM EST To: Otila Kluver, RN  Please let Mrs. Boyd-Gilyard know that her LE doppler US did not show any evidence of residual or recurrent DVT.  ----- Message ----- From: Interface, [employer] Sent: 03/06/2022   9:31 AM EST To: Orson Slick, MD

## 2022-03-23 DIAGNOSIS — I10 Essential (primary) hypertension: Secondary | ICD-10-CM | POA: Diagnosis not present

## 2022-03-23 DIAGNOSIS — H52223 Regular astigmatism, bilateral: Secondary | ICD-10-CM | POA: Diagnosis not present

## 2022-03-23 DIAGNOSIS — H524 Presbyopia: Secondary | ICD-10-CM | POA: Diagnosis not present

## 2022-03-23 DIAGNOSIS — H53143 Visual discomfort, bilateral: Secondary | ICD-10-CM | POA: Diagnosis not present

## 2022-03-23 DIAGNOSIS — H35033 Hypertensive retinopathy, bilateral: Secondary | ICD-10-CM | POA: Diagnosis not present

## 2022-03-23 DIAGNOSIS — E119 Type 2 diabetes mellitus without complications: Secondary | ICD-10-CM | POA: Diagnosis not present

## 2022-03-24 DIAGNOSIS — H52223 Regular astigmatism, bilateral: Secondary | ICD-10-CM | POA: Diagnosis not present

## 2022-03-31 ENCOUNTER — Ambulatory Visit
Admission: RE | Admit: 2022-03-31 | Discharge: 2022-03-31 | Disposition: A | Discharge status: Home / Self Care | Payer: 59 | Source: Ambulatory Visit | Attending: Internal Medicine | Admitting: Internal Medicine

## 2022-03-31 DIAGNOSIS — Z1231 Encounter for screening mammogram for malignant neoplasm of breast: Secondary | ICD-10-CM

## 2022-04-02 DIAGNOSIS — F411 Generalized anxiety disorder: Secondary | ICD-10-CM | POA: Diagnosis not present

## 2022-04-06 DIAGNOSIS — Z23 Encounter for immunization: Secondary | ICD-10-CM | POA: Diagnosis not present

## 2022-04-16 DIAGNOSIS — F411 Generalized anxiety disorder: Secondary | ICD-10-CM | POA: Diagnosis not present

## 2022-04-27 ENCOUNTER — Other Ambulatory Visit (HOSPITAL_COMMUNITY): Payer: Self-pay

## 2022-04-30 DIAGNOSIS — F411 Generalized anxiety disorder: Secondary | ICD-10-CM | POA: Diagnosis not present

## 2022-05-11 DIAGNOSIS — Z6837 Body mass index (BMI) 37.0-37.9, adult: Secondary | ICD-10-CM | POA: Diagnosis not present

## 2022-05-11 DIAGNOSIS — D509 Iron deficiency anemia, unspecified: Secondary | ICD-10-CM | POA: Diagnosis not present

## 2022-05-11 DIAGNOSIS — E1169 Type 2 diabetes mellitus with other specified complication: Secondary | ICD-10-CM | POA: Diagnosis not present

## 2022-05-12 ENCOUNTER — Other Ambulatory Visit (HOSPITAL_COMMUNITY): Payer: Self-pay

## 2022-05-12 MED ORDER — OZEMPIC (0.25 OR 0.5 MG/DOSE) 2 MG/3ML ~~LOC~~ SOPN
0.2500 mg | PEN_INJECTOR | SUBCUTANEOUS | 5 refills | Status: DC
  Filled 2022-05-12: qty 3, 30d supply, fill #0
  Filled 2022-05-15: qty 3, 28d supply, fill #0
  Filled 2022-06-07: qty 3, 28d supply, fill #1
  Filled 2022-07-05: qty 3, 28d supply, fill #2
  Filled 2022-07-25 – 2022-08-02 (×2): qty 3, 28d supply, fill #3

## 2022-05-13 ENCOUNTER — Other Ambulatory Visit (HOSPITAL_COMMUNITY): Payer: Self-pay

## 2022-05-13 ENCOUNTER — Other Ambulatory Visit: Payer: Self-pay

## 2022-05-13 ENCOUNTER — Telehealth: Payer: Self-pay | Admitting: Pharmacy Technician

## 2022-05-13 NOTE — Telephone Encounter (Signed)
Auth Submission: NO AUTH NEEDED Payer: [employer] Medication & CPT/J Code(s) submitted: Venofer (Iron Sucrose) [id number] Route of submission (phone, fax, portal):  Phone # Fax # Auth type: Buy/Bill Units/visits requested: X4 Reference number:  Approval from: 05/13/22 to 09/12/22

## 2022-05-14 ENCOUNTER — Other Ambulatory Visit (HOSPITAL_COMMUNITY): Payer: Self-pay

## 2022-05-14 ENCOUNTER — Encounter (HOSPITAL_COMMUNITY): Payer: Self-pay

## 2022-05-15 ENCOUNTER — Other Ambulatory Visit (HOSPITAL_COMMUNITY): Payer: Self-pay

## 2022-05-15 ENCOUNTER — Other Ambulatory Visit: Payer: Self-pay

## 2022-05-18 ENCOUNTER — Ambulatory Visit (INDEPENDENT_AMBULATORY_CARE_PROVIDER_SITE_OTHER): Payer: 59

## 2022-05-18 VITALS — BP 112/74 | HR 63 | Temp 97.5°F | Resp 20 | Ht 67.0 in | Wt 239.2 lb

## 2022-05-18 DIAGNOSIS — D508 Other iron deficiency anemias: Secondary | ICD-10-CM

## 2022-05-18 DIAGNOSIS — D500 Iron deficiency anemia secondary to blood loss (chronic): Secondary | ICD-10-CM

## 2022-05-18 DIAGNOSIS — Z9884 Bariatric surgery status: Secondary | ICD-10-CM

## 2022-05-18 MED ORDER — SODIUM CHLORIDE 0.9 % IV SOLN
200.0000 mg | Freq: Once | INTRAVENOUS | Status: AC
  Administered 2022-05-18: 200 mg via INTRAVENOUS
  Filled 2022-05-18: qty 10

## 2022-05-18 MED ORDER — DIPHENHYDRAMINE HCL 25 MG PO CAPS: 25.0000 mg | ORAL_CAPSULE | Freq: Once | ORAL | Status: DC

## 2022-05-18 MED ORDER — ACETAMINOPHEN 325 MG PO TABS: 650.0000 mg | ORAL_TABLET | Freq: Once | ORAL | Status: DC

## 2022-05-18 NOTE — Patient Instructions (Signed)

## 2022-05-18 NOTE — Progress Notes (Signed)
Diagnosis: Iron Deficiency Anemia  Provider:  Marshell Garfinkel MD  Procedure: Infusion  IV Type: Peripheral, IV Location: L Forearm  Venofer (Iron Sucrose), Dose: 200 mg  Infusion Start Time: [id number]  Infusion Stop Time: [id number]  Post Infusion IV Care: Patient declined observation and Peripheral IV Discontinued  Discharge: Condition: Good, Destination: Home . AVS Provided  Performed by:  Cleophus Molt, RN

## 2022-05-21 DIAGNOSIS — F411 Generalized anxiety disorder: Secondary | ICD-10-CM | POA: Diagnosis not present

## 2022-05-26 ENCOUNTER — Ambulatory Visit (INDEPENDENT_AMBULATORY_CARE_PROVIDER_SITE_OTHER): Payer: 59

## 2022-05-26 VITALS — BP 101/64 | HR 75 | Temp 98.4°F | Resp 18 | Ht 67.0 in | Wt 244.8 lb

## 2022-05-26 DIAGNOSIS — D508 Other iron deficiency anemias: Secondary | ICD-10-CM | POA: Diagnosis not present

## 2022-05-26 DIAGNOSIS — D500 Iron deficiency anemia secondary to blood loss (chronic): Secondary | ICD-10-CM

## 2022-05-26 DIAGNOSIS — Z9884 Bariatric surgery status: Secondary | ICD-10-CM | POA: Diagnosis not present

## 2022-05-26 MED ORDER — SODIUM CHLORIDE 0.9 % IV SOLN
200.0000 mg | Freq: Once | INTRAVENOUS | Status: AC
  Administered 2022-05-26: 200 mg via INTRAVENOUS
  Filled 2022-05-26: qty 10

## 2022-05-26 MED ORDER — ACETAMINOPHEN 325 MG PO TABS: 650.0000 mg | ORAL_TABLET | Freq: Once | ORAL | Status: DC

## 2022-05-26 MED ORDER — DIPHENHYDRAMINE HCL 25 MG PO CAPS: 25.0000 mg | ORAL_CAPSULE | Freq: Once | ORAL | Status: DC

## 2022-05-26 NOTE — Progress Notes (Signed)
Diagnosis: Iron Deficiency Anemia  Provider:  Marshell Garfinkel MD  Procedure: Infusion  IV Type: Peripheral, IV Location: L Forearm  Venofer (Iron Sucrose), Dose: 200 mg  Infusion Start Time: 1120  Infusion Stop Time: [id number]  Post Infusion IV Care: Peripheral IV Discontinued  Discharge: Condition: Good, Destination: Home . AVS Declined  Performed by:  Adelina Mings, LPN

## 2022-06-03 ENCOUNTER — Ambulatory Visit (INDEPENDENT_AMBULATORY_CARE_PROVIDER_SITE_OTHER): Payer: 59

## 2022-06-03 VITALS — BP 122/71 | HR 65 | Temp 98.2°F | Resp 18 | Ht 67.0 in | Wt 238.0 lb

## 2022-06-03 DIAGNOSIS — Z9884 Bariatric surgery status: Secondary | ICD-10-CM | POA: Diagnosis not present

## 2022-06-03 DIAGNOSIS — D500 Iron deficiency anemia secondary to blood loss (chronic): Secondary | ICD-10-CM

## 2022-06-03 DIAGNOSIS — D508 Other iron deficiency anemias: Secondary | ICD-10-CM

## 2022-06-03 MED ORDER — ACETAMINOPHEN 325 MG PO TABS: 650.0000 mg | ORAL_TABLET | Freq: Once | ORAL | Status: DC

## 2022-06-03 MED ORDER — DIPHENHYDRAMINE HCL 25 MG PO CAPS: 25.0000 mg | ORAL_CAPSULE | Freq: Once | ORAL | Status: DC

## 2022-06-03 MED ORDER — SODIUM CHLORIDE 0.9 % IV SOLN
300.0000 mg | Freq: Once | INTRAVENOUS | Status: AC
  Administered 2022-06-03: 300 mg via INTRAVENOUS
  Filled 2022-06-03: qty 15

## 2022-06-03 NOTE — Progress Notes (Signed)
Diagnosis: Iron Deficiency Anemia  Provider:  Marshell Garfinkel MD  Procedure: Infusion  IV Type: Peripheral, IV Location: R Hand  Venofer (Iron Sucrose), Dose: 300 mg  Infusion Start Time: 0924  Infusion Stop Time: [id number]  Post Infusion IV Care: Peripheral IV Discontinued  Discharge: Condition: Good, Destination: Home . AVS Provided and AVS Declined  Performed by:  Cleophus Molt, RN

## 2022-06-08 ENCOUNTER — Other Ambulatory Visit: Payer: Self-pay

## 2022-06-09 ENCOUNTER — Ambulatory Visit (INDEPENDENT_AMBULATORY_CARE_PROVIDER_SITE_OTHER): Payer: 59

## 2022-06-09 VITALS — BP 110/64 | HR 67 | Temp 97.9°F | Resp 18 | Ht 67.0 in | Wt 237.0 lb

## 2022-06-09 DIAGNOSIS — Z9884 Bariatric surgery status: Secondary | ICD-10-CM

## 2022-06-09 DIAGNOSIS — D500 Iron deficiency anemia secondary to blood loss (chronic): Secondary | ICD-10-CM | POA: Diagnosis not present

## 2022-06-09 DIAGNOSIS — N920 Excessive and frequent menstruation with regular cycle: Secondary | ICD-10-CM

## 2022-06-09 MED ORDER — SODIUM CHLORIDE 0.9 % IV SOLN
300.0000 mg | Freq: Once | INTRAVENOUS | Status: AC
  Administered 2022-06-09: 300 mg via INTRAVENOUS
  Filled 2022-06-09: qty 15

## 2022-06-09 MED ORDER — DIPHENHYDRAMINE HCL 25 MG PO CAPS: 25.0000 mg | ORAL_CAPSULE | Freq: Once | ORAL | Status: DC

## 2022-06-09 MED ORDER — ACETAMINOPHEN 325 MG PO TABS: 650.0000 mg | ORAL_TABLET | Freq: Once | ORAL | Status: DC

## 2022-06-09 NOTE — Progress Notes (Signed)
Diagnosis: Iron Deficiency Anemia  Provider:  Chilton Greathouse MD  Procedure: Infusion  IV Type: Peripheral, IV Location: L Hand  Venofer (Iron Sucrose), Dose: 300 mg  Infusion Start Time: 0931  Infusion Stop Time: 1111  Post Infusion IV Care: Peripheral IV Discontinued  Discharge: Condition: Good, Destination: Home . AVS Declined  Performed by:  Garnette Czech, RN

## 2022-06-10 ENCOUNTER — Other Ambulatory Visit (HOSPITAL_COMMUNITY): Payer: Self-pay

## 2022-06-11 DIAGNOSIS — F411 Generalized anxiety disorder: Secondary | ICD-10-CM | POA: Diagnosis not present

## 2022-07-06 ENCOUNTER — Other Ambulatory Visit: Payer: Self-pay

## 2022-07-09 DIAGNOSIS — F411 Generalized anxiety disorder: Secondary | ICD-10-CM | POA: Diagnosis not present

## 2022-07-20 ENCOUNTER — Other Ambulatory Visit (HOSPITAL_COMMUNITY): Payer: Self-pay

## 2022-07-20 MED ORDER — AMOXICILLIN 500 MG PO CAPS
ORAL_CAPSULE | ORAL | 1 refills | Status: AC
  Filled 2022-07-20: qty 30, 10d supply, fill #0

## 2022-07-25 ENCOUNTER — Other Ambulatory Visit (HOSPITAL_COMMUNITY): Payer: Self-pay

## 2022-07-29 ENCOUNTER — Other Ambulatory Visit (HOSPITAL_COMMUNITY): Payer: Self-pay

## 2022-07-29 ENCOUNTER — Encounter: Payer: Self-pay | Admitting: Pulmonary Disease

## 2022-07-29 MED ORDER — FLUCONAZOLE 150 MG PO TABS
150.0000 mg | ORAL_TABLET | ORAL | 0 refills | Status: AC
  Filled 2022-07-29: qty 2, 6d supply, fill #0

## 2022-08-03 ENCOUNTER — Other Ambulatory Visit: Payer: Self-pay

## 2022-08-03 DIAGNOSIS — H6990 Unspecified Eustachian tube disorder, unspecified ear: Secondary | ICD-10-CM | POA: Diagnosis not present

## 2022-08-03 DIAGNOSIS — R599 Enlarged lymph nodes, unspecified: Secondary | ICD-10-CM | POA: Diagnosis not present

## 2022-08-03 DIAGNOSIS — E1169 Type 2 diabetes mellitus with other specified complication: Secondary | ICD-10-CM | POA: Diagnosis not present

## 2022-08-04 ENCOUNTER — Other Ambulatory Visit (HOSPITAL_COMMUNITY): Payer: Self-pay

## 2022-08-04 MED ORDER — OZEMPIC (1 MG/DOSE) 4 MG/3ML ~~LOC~~ SOPN
1.0000 mg | PEN_INJECTOR | SUBCUTANEOUS | 5 refills | Status: DC
  Filled 2022-09-15: qty 3, 28d supply, fill #0
  Filled 2022-10-09: qty 3, 28d supply, fill #1
  Filled 2022-11-04: qty 3, 28d supply, fill #2
  Filled 2022-11-23 – 2022-11-27 (×2): qty 3, 28d supply, fill #3

## 2022-08-11 ENCOUNTER — Encounter: Payer: Self-pay | Admitting: Pulmonary Disease

## 2022-08-20 DIAGNOSIS — F411 Generalized anxiety disorder: Secondary | ICD-10-CM | POA: Diagnosis not present

## 2022-09-09 ENCOUNTER — Other Ambulatory Visit: Payer: Self-pay | Admitting: Oncology

## 2022-09-09 DIAGNOSIS — Z006 Encounter for examination for normal comparison and control in clinical research program: Secondary | ICD-10-CM

## 2022-09-10 ENCOUNTER — Other Ambulatory Visit (HOSPITAL_COMMUNITY)
Admission: RE | Admit: 2022-09-10 | Discharge: 2022-09-10 | Disposition: A | Discharge status: Home / Self Care | Payer: 59 | Source: Other Acute Inpatient Hospital | Attending: Oncology | Admitting: Oncology

## 2022-09-10 DIAGNOSIS — Z630 Problems in relationship with spouse or partner: Secondary | ICD-10-CM | POA: Diagnosis not present

## 2022-09-10 DIAGNOSIS — Z006 Encounter for examination for normal comparison and control in clinical research program: Secondary | ICD-10-CM | POA: Insufficient documentation

## 2022-09-14 ENCOUNTER — Other Ambulatory Visit (HOSPITAL_COMMUNITY): Payer: Self-pay

## 2022-09-14 DIAGNOSIS — J45909 Unspecified asthma, uncomplicated: Secondary | ICD-10-CM | POA: Diagnosis not present

## 2022-09-14 DIAGNOSIS — Z01419 Encounter for gynecological examination (general) (routine) without abnormal findings: Secondary | ICD-10-CM | POA: Diagnosis not present

## 2022-09-14 DIAGNOSIS — Z23 Encounter for immunization: Secondary | ICD-10-CM | POA: Diagnosis not present

## 2022-09-14 DIAGNOSIS — I10 Essential (primary) hypertension: Secondary | ICD-10-CM | POA: Diagnosis not present

## 2022-09-14 DIAGNOSIS — Z1389 Encounter for screening for other disorder: Secondary | ICD-10-CM | POA: Diagnosis not present

## 2022-09-14 DIAGNOSIS — F419 Anxiety disorder, unspecified: Secondary | ICD-10-CM | POA: Diagnosis not present

## 2022-09-14 DIAGNOSIS — D509 Iron deficiency anemia, unspecified: Secondary | ICD-10-CM | POA: Diagnosis not present

## 2022-09-14 DIAGNOSIS — Z0000 Encounter for general adult medical examination without abnormal findings: Secondary | ICD-10-CM | POA: Diagnosis not present

## 2022-09-14 DIAGNOSIS — E7800 Pure hypercholesterolemia, unspecified: Secondary | ICD-10-CM | POA: Diagnosis not present

## 2022-09-14 DIAGNOSIS — Z13220 Encounter for screening for lipoid disorders: Secondary | ICD-10-CM | POA: Diagnosis not present

## 2022-09-14 DIAGNOSIS — N920 Excessive and frequent menstruation with regular cycle: Secondary | ICD-10-CM | POA: Diagnosis not present

## 2022-09-14 DIAGNOSIS — B009 Herpesviral infection, unspecified: Secondary | ICD-10-CM | POA: Diagnosis not present

## 2022-09-14 DIAGNOSIS — E1169 Type 2 diabetes mellitus with other specified complication: Secondary | ICD-10-CM | POA: Diagnosis not present

## 2022-09-15 ENCOUNTER — Other Ambulatory Visit (HOSPITAL_COMMUNITY): Payer: Self-pay

## 2022-09-15 ENCOUNTER — Other Ambulatory Visit: Payer: Self-pay

## 2022-09-15 MED ORDER — ALBUTEROL SULFATE HFA 108 (90 BASE) MCG/ACT IN AERS
2.0000 | INHALATION_SPRAY | RESPIRATORY_TRACT | 5 refills | Status: AC | PRN
  Filled 2022-09-15: qty 20.1, 51d supply, fill #0

## 2022-09-15 MED ORDER — VALACYCLOVIR HCL 500 MG PO TABS
1000.0000 mg | ORAL_TABLET | Freq: Every day | ORAL | 6 refills | Status: AC
  Filled 2022-09-15 – 2022-11-08 (×2): qty 20, 10d supply, fill #0

## 2022-09-15 MED ORDER — BUSPIRONE HCL 5 MG PO TABS
5.0000 mg | ORAL_TABLET | Freq: Two times a day (BID) | ORAL | 5 refills | Status: AC
  Filled 2022-09-15: qty 180, 90d supply, fill #0
  Filled 2022-11-14: qty 180, 90d supply, fill #1
  Filled 2022-12-25: qty 60, 30d supply, fill #1

## 2022-09-15 MED ORDER — BUDESONIDE-FORMOTEROL FUMARATE 160-4.5 MCG/ACT IN AERO
2.0000 | INHALATION_SPRAY | Freq: Two times a day (BID) | RESPIRATORY_TRACT | 4 refills | Status: AC
  Filled 2022-09-15: qty 30.6, 90d supply, fill #0
  Filled 2022-11-04 – 2022-11-30 (×2): qty 30.6, 90d supply, fill #1
  Filled 2023-07-12: qty 10.2, 30d supply, fill #2

## 2022-09-15 MED ORDER — HYDROCHLOROTHIAZIDE 12.5 MG PO CAPS
12.5000 mg | ORAL_CAPSULE | Freq: Every morning | ORAL | 5 refills | Status: AC
Start: 2022-09-14 — End: ?
  Filled 2022-09-15: qty 90, 90d supply, fill #0
  Filled 2022-11-14: qty 90, 90d supply, fill #1
  Filled 2023-02-03: qty 30, 30d supply, fill #1
  Filled 2023-07-12 – 2023-07-13 (×3): qty 30, 30d supply, fill #2
  Filled 2023-07-13: qty 90, 90d supply, fill #2
  Filled 2023-07-13: qty 30, 30d supply, fill #2
  Filled 2023-07-13: qty 90, 90d supply, fill #2

## 2022-09-16 ENCOUNTER — Other Ambulatory Visit (HOSPITAL_COMMUNITY): Payer: Self-pay

## 2022-10-01 DIAGNOSIS — F411 Generalized anxiety disorder: Secondary | ICD-10-CM | POA: Diagnosis not present

## 2022-10-09 ENCOUNTER — Other Ambulatory Visit: Payer: Self-pay

## 2022-10-29 DIAGNOSIS — F411 Generalized anxiety disorder: Secondary | ICD-10-CM | POA: Diagnosis not present

## 2022-11-04 ENCOUNTER — Other Ambulatory Visit (HOSPITAL_COMMUNITY): Payer: Self-pay

## 2022-11-04 ENCOUNTER — Other Ambulatory Visit: Payer: Self-pay

## 2022-11-09 ENCOUNTER — Other Ambulatory Visit (HOSPITAL_COMMUNITY): Payer: Self-pay

## 2022-11-14 ENCOUNTER — Other Ambulatory Visit (HOSPITAL_COMMUNITY): Payer: Self-pay

## 2022-11-16 ENCOUNTER — Other Ambulatory Visit (HOSPITAL_COMMUNITY): Payer: Self-pay

## 2022-11-16 MED ORDER — BUDESONIDE-FORMOTEROL FUMARATE 160-4.5 MCG/ACT IN AERO
2.0000 | INHALATION_SPRAY | Freq: Two times a day (BID) | RESPIRATORY_TRACT | 4 refills | Status: AC
  Filled 2022-11-16: qty 30.6, 90d supply, fill #0
  Filled 2023-07-12 – 2023-07-13 (×2): qty 10.2, 30d supply, fill #0

## 2022-11-17 ENCOUNTER — Other Ambulatory Visit (HOSPITAL_COMMUNITY): Payer: Self-pay

## 2022-11-19 DIAGNOSIS — F411 Generalized anxiety disorder: Secondary | ICD-10-CM | POA: Diagnosis not present

## 2022-11-23 ENCOUNTER — Other Ambulatory Visit (HOSPITAL_COMMUNITY): Payer: Self-pay

## 2022-11-27 ENCOUNTER — Other Ambulatory Visit: Payer: Self-pay

## 2022-11-30 ENCOUNTER — Other Ambulatory Visit (HOSPITAL_COMMUNITY): Payer: Self-pay

## 2022-12-14 LAB — HELIX MOLECULAR SCREEN: Genetic Analysis Overall Interpretation: NEGATIVE

## 2022-12-25 ENCOUNTER — Other Ambulatory Visit (HOSPITAL_COMMUNITY): Payer: Self-pay

## 2022-12-26 ENCOUNTER — Other Ambulatory Visit (HOSPITAL_COMMUNITY): Payer: Self-pay

## 2022-12-28 ENCOUNTER — Other Ambulatory Visit (HOSPITAL_COMMUNITY): Payer: Self-pay

## 2022-12-28 MED ORDER — OZEMPIC (1 MG/DOSE) 4 MG/3ML ~~LOC~~ SOPN
1.0000 mg | PEN_INJECTOR | SUBCUTANEOUS | 5 refills | Status: AC
Start: 2022-12-28 — End: ?
  Filled 2022-12-28 – 2023-07-13 (×5): qty 3, 28d supply, fill #0

## 2023-01-11 ENCOUNTER — Other Ambulatory Visit (HOSPITAL_COMMUNITY): Payer: Self-pay

## 2023-01-11 MED ORDER — OZEMPIC (1 MG/DOSE) 4 MG/3ML ~~LOC~~ SOPN
1.0000 mg | PEN_INJECTOR | SUBCUTANEOUS | 5 refills | Status: AC
Start: 2023-01-11 — End: ?
  Filled 2023-01-11 – 2023-05-05 (×2): qty 3, 28d supply, fill #0

## 2023-01-13 ENCOUNTER — Other Ambulatory Visit (HOSPITAL_COMMUNITY): Payer: Self-pay

## 2023-01-15 ENCOUNTER — Other Ambulatory Visit: Payer: Self-pay

## 2023-01-15 ENCOUNTER — Other Ambulatory Visit (HOSPITAL_COMMUNITY): Payer: Self-pay

## 2023-01-16 ENCOUNTER — Other Ambulatory Visit (HOSPITAL_COMMUNITY): Payer: Self-pay

## 2023-01-18 ENCOUNTER — Other Ambulatory Visit (HOSPITAL_COMMUNITY): Payer: Self-pay

## 2023-01-18 MED ORDER — OZEMPIC (1 MG/DOSE) 4 MG/3ML ~~LOC~~ SOPN
1.0000 mg | PEN_INJECTOR | SUBCUTANEOUS | 5 refills | Status: DC
  Filled 2023-01-18 – 2023-01-19 (×2): qty 3, 28d supply, fill #0

## 2023-01-19 ENCOUNTER — Other Ambulatory Visit (HOSPITAL_COMMUNITY): Payer: Self-pay

## 2023-02-03 ENCOUNTER — Other Ambulatory Visit: Payer: Self-pay

## 2023-02-03 ENCOUNTER — Other Ambulatory Visit (HOSPITAL_COMMUNITY): Payer: Self-pay

## 2023-02-04 ENCOUNTER — Other Ambulatory Visit (HOSPITAL_COMMUNITY): Payer: Self-pay

## 2023-02-04 ENCOUNTER — Encounter (HOSPITAL_COMMUNITY): Payer: Self-pay

## 2023-02-04 ENCOUNTER — Other Ambulatory Visit: Payer: Self-pay

## 2023-02-04 MED ORDER — FLUCONAZOLE 150 MG PO TABS
ORAL_TABLET | ORAL | 0 refills | Status: AC
  Filled 2023-02-04: qty 2, 3d supply, fill #0

## 2023-02-25 ENCOUNTER — Other Ambulatory Visit: Payer: Self-pay | Admitting: Internal Medicine

## 2023-02-25 DIAGNOSIS — Z0000 Encounter for general adult medical examination without abnormal findings: Secondary | ICD-10-CM

## 2023-03-16 ENCOUNTER — Other Ambulatory Visit (HOSPITAL_COMMUNITY): Payer: Self-pay

## 2023-03-16 ENCOUNTER — Other Ambulatory Visit: Payer: Self-pay

## 2023-03-16 MED ORDER — OZEMPIC (2 MG/DOSE) 8 MG/3ML ~~LOC~~ SOPN
2.0000 mg | PEN_INJECTOR | SUBCUTANEOUS | 11 refills | Status: AC
  Filled 2023-03-16 – 2023-03-23 (×2): qty 3, 28d supply, fill #0
  Filled 2023-04-16 – 2023-04-17 (×2): qty 3, 28d supply, fill #1
  Filled 2023-05-21: qty 3, 28d supply, fill #2

## 2023-03-16 MED ORDER — HYDROCHLOROTHIAZIDE 12.5 MG PO CAPS
12.5000 mg | ORAL_CAPSULE | Freq: Every morning | ORAL | 5 refills | Status: AC
Start: 2023-03-15 — End: ?
  Filled 2023-03-16 – 2023-05-05 (×2): qty 30, 30d supply, fill #0
  Filled 2023-07-12: qty 30, 30d supply, fill #1

## 2023-03-16 MED ORDER — BUDESONIDE-FORMOTEROL FUMARATE 160-4.5 MCG/ACT IN AERO
2.0000 | INHALATION_SPRAY | Freq: Two times a day (BID) | RESPIRATORY_TRACT | 4 refills | Status: AC
Start: 2023-03-15 — End: ?
  Filled 2023-03-16 – 2023-05-05 (×2): qty 10.2, 30d supply, fill #0

## 2023-03-16 MED ORDER — BUSPIRONE HCL 5 MG PO TABS
5.0000 mg | ORAL_TABLET | Freq: Two times a day (BID) | ORAL | 5 refills | Status: AC
Start: 2023-03-15 — End: ?
  Filled 2023-03-16: qty 60, 30d supply, fill #0

## 2023-03-17 ENCOUNTER — Other Ambulatory Visit (HOSPITAL_COMMUNITY): Payer: Self-pay

## 2023-03-17 ENCOUNTER — Other Ambulatory Visit: Payer: Self-pay

## 2023-03-18 ENCOUNTER — Other Ambulatory Visit: Payer: Self-pay

## 2023-03-22 ENCOUNTER — Other Ambulatory Visit: Payer: Self-pay

## 2023-03-23 ENCOUNTER — Other Ambulatory Visit (HOSPITAL_COMMUNITY): Payer: Self-pay

## 2023-04-02 ENCOUNTER — Ambulatory Visit
Admission: RE | Admit: 2023-04-02 | Discharge: 2023-04-02 | Disposition: A | Discharge status: Home / Self Care | Payer: Managed Care, Other (non HMO) | Source: Ambulatory Visit | Attending: Internal Medicine | Admitting: Internal Medicine

## 2023-04-02 DIAGNOSIS — Z0000 Encounter for general adult medical examination without abnormal findings: Secondary | ICD-10-CM

## 2023-04-17 ENCOUNTER — Other Ambulatory Visit (HOSPITAL_COMMUNITY): Payer: Self-pay

## 2023-04-17 ENCOUNTER — Other Ambulatory Visit (HOSPITAL_BASED_OUTPATIENT_CLINIC_OR_DEPARTMENT_OTHER): Payer: Self-pay

## 2023-05-05 ENCOUNTER — Other Ambulatory Visit: Payer: Self-pay

## 2023-05-05 ENCOUNTER — Other Ambulatory Visit (HOSPITAL_COMMUNITY): Payer: Self-pay

## 2023-05-11 ENCOUNTER — Other Ambulatory Visit (HOSPITAL_COMMUNITY): Payer: Self-pay

## 2023-05-21 ENCOUNTER — Other Ambulatory Visit: Payer: Self-pay

## 2023-05-21 ENCOUNTER — Other Ambulatory Visit (HOSPITAL_COMMUNITY): Payer: Self-pay

## 2023-05-21 MED ORDER — MELOXICAM 15 MG PO TABS
15.0000 mg | ORAL_TABLET | Freq: Every day | ORAL | 0 refills | Status: AC
  Filled 2023-05-21: qty 30, 30d supply, fill #0

## 2023-07-12 ENCOUNTER — Other Ambulatory Visit (HOSPITAL_COMMUNITY): Payer: Self-pay

## 2023-07-13 ENCOUNTER — Other Ambulatory Visit (HOSPITAL_COMMUNITY): Payer: Self-pay

## 2023-07-13 ENCOUNTER — Encounter: Payer: Self-pay | Admitting: Pharmacist

## 2023-07-13 ENCOUNTER — Other Ambulatory Visit: Payer: Self-pay

## 2023-07-14 ENCOUNTER — Other Ambulatory Visit: Payer: Self-pay

## 2023-07-16 ENCOUNTER — Other Ambulatory Visit (HOSPITAL_COMMUNITY): Payer: Self-pay

## 2023-08-02 ENCOUNTER — Other Ambulatory Visit (HOSPITAL_COMMUNITY): Payer: Self-pay

## 2023-09-23 ENCOUNTER — Other Ambulatory Visit (HOSPITAL_BASED_OUTPATIENT_CLINIC_OR_DEPARTMENT_OTHER): Payer: Self-pay | Admitting: Internal Medicine

## 2023-09-23 DIAGNOSIS — Z8249 Family history of ischemic heart disease and other diseases of the circulatory system: Secondary | ICD-10-CM

## 2023-10-05 ENCOUNTER — Ambulatory Visit (HOSPITAL_COMMUNITY)
Admission: RE | Admit: 2023-10-05 | Discharge: 2023-10-05 | Disposition: A | Discharge status: Home / Self Care | Payer: Self-pay | Source: Ambulatory Visit | Attending: Cardiology | Admitting: Cardiology

## 2023-10-05 DIAGNOSIS — Z8249 Family history of ischemic heart disease and other diseases of the circulatory system: Secondary | ICD-10-CM | POA: Insufficient documentation

## 2023-10-05 DIAGNOSIS — Z136 Encounter for screening for cardiovascular disorders: Secondary | ICD-10-CM | POA: Insufficient documentation

## 2023-10-07 ENCOUNTER — Ambulatory Visit (HOSPITAL_COMMUNITY)

## 2024-01-01 IMAGING — MG MM DIGITAL DIAGNOSTIC UNILAT*L* W/ TOMO W/ CAD
6 series · 6 of 18 positions shown · non-contrast
Comparison: Previous exam(s).

CLINICAL DATA: The patient was called back for a left breast mass.

EXAM:
DIGITAL DIAGNOSTIC UNILATERAL LEFT MAMMOGRAM WITH TOMOSYNTHESIS AND
CAD
TECHNIQUE: Left digital diagnostic mammography and breast tomosynthesis was
performed. The images were evaluated with computer-aided detection.

[L CC synth-2D (1 of 2)]
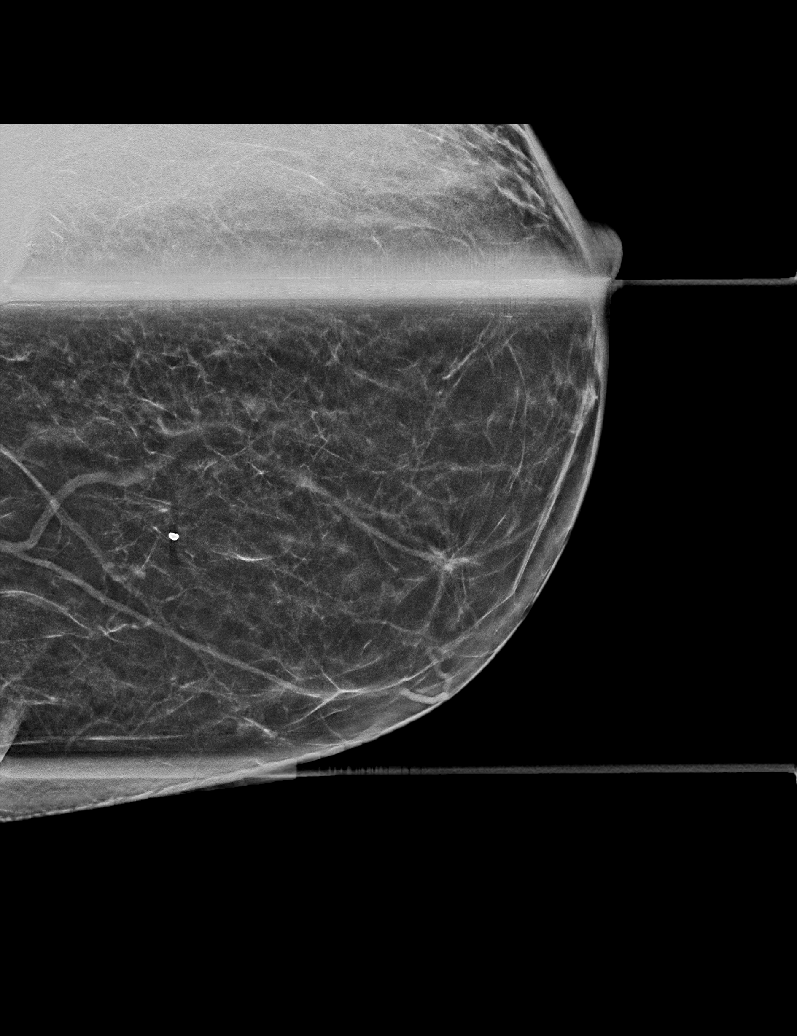

[L CC synth-2D (2 of 2)]
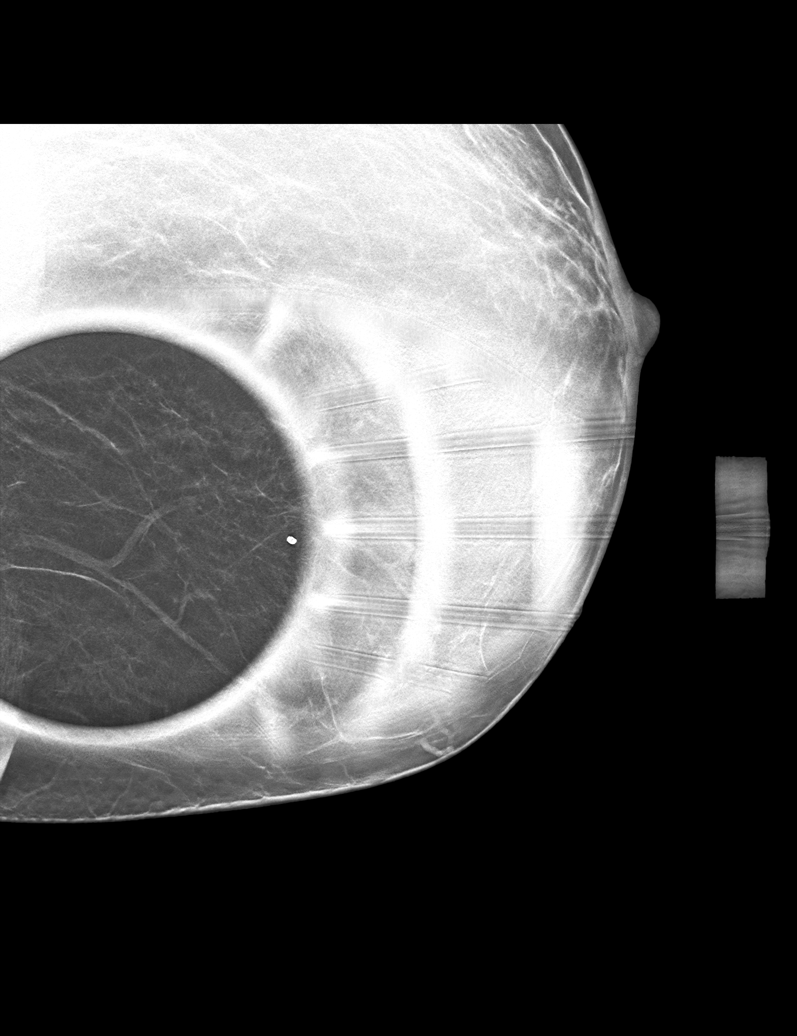

[L MLO synth-2D]
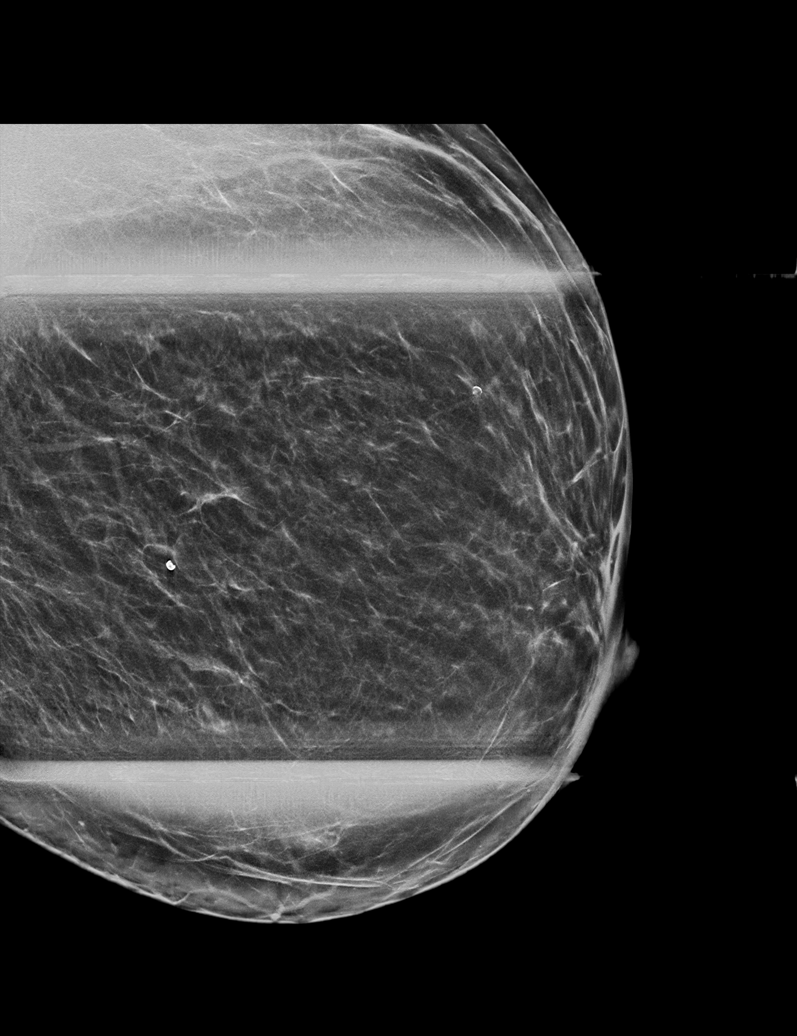

[L CC tomo (1 of 2) · tomo slice 21/42.0]
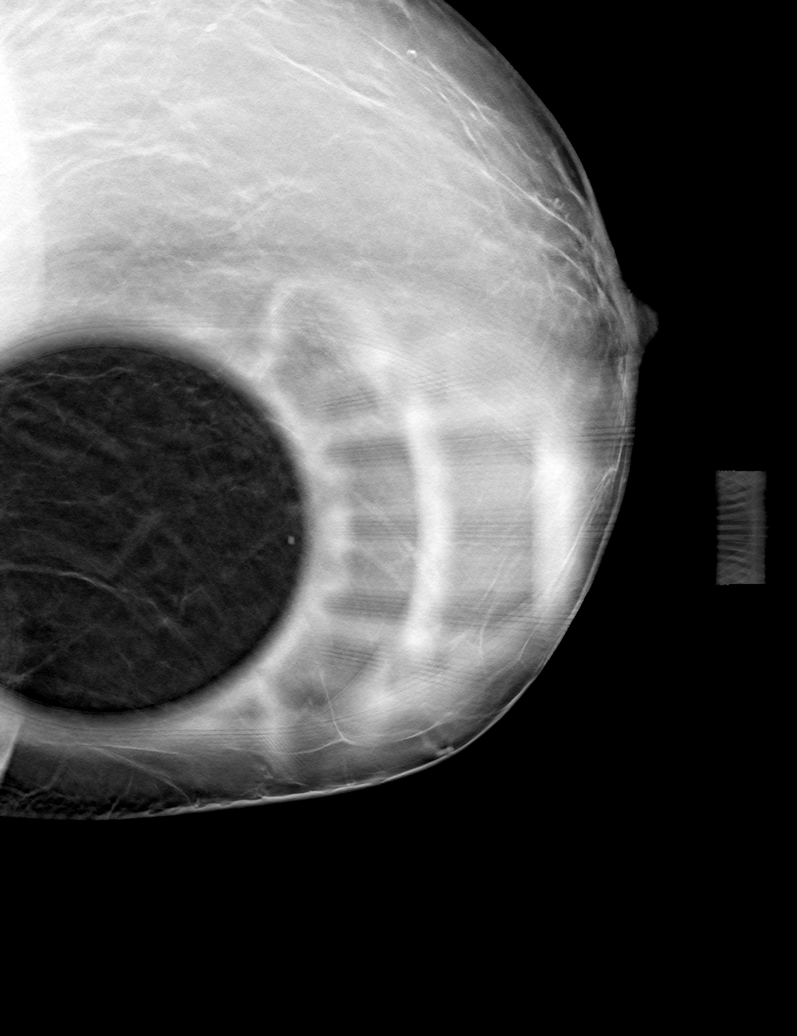

[L MLO tomo · tomo slice 31/61.0]
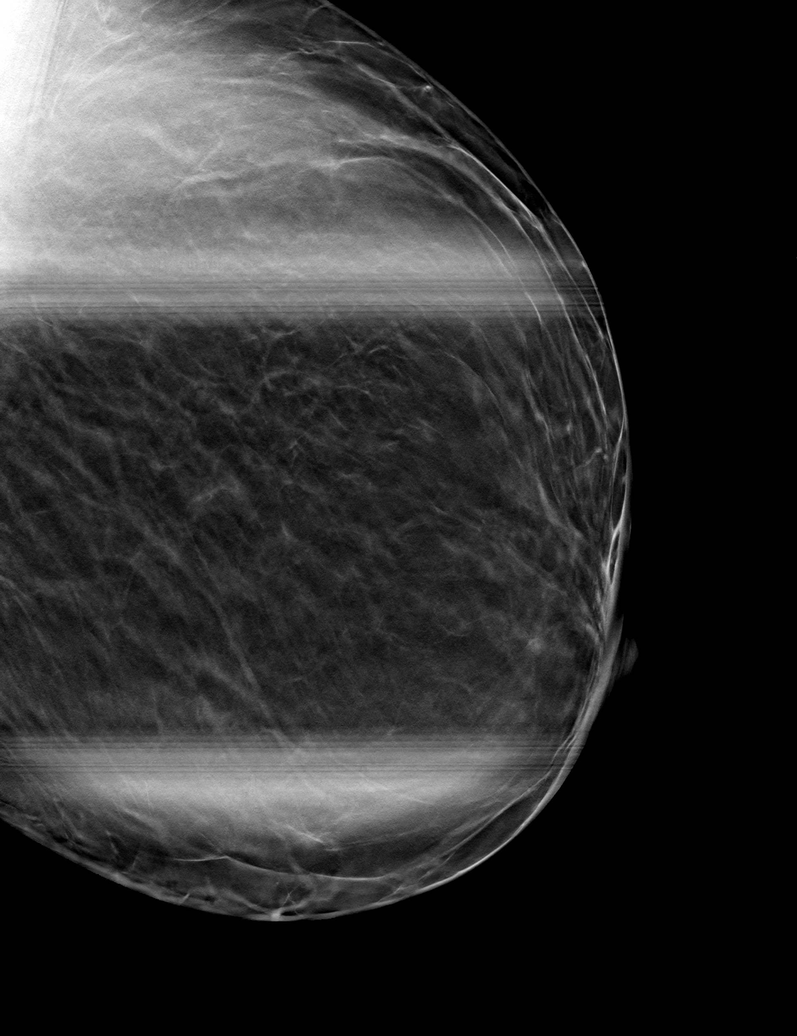

[L CC tomo (2 of 2) · tomo slice 25/48.0]
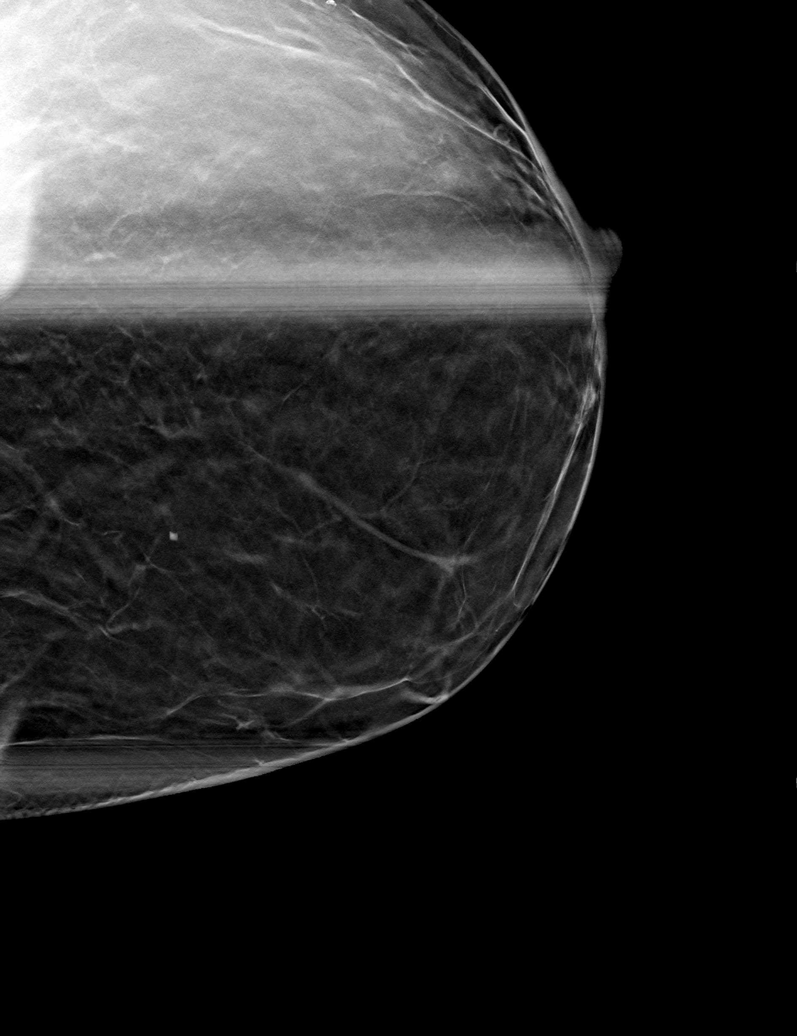

[6 of 18 positions shown; findings below may reference images not displayed]

ACR Breast Density Category b: There are scattered areas of
fibroglandular density.
FINDINGS: The left breast mass located medially and centrally resolves on
today's imaging. No.
IMPRESSION: No mammographic evidence of malignancy.

RECOMMENDATION:
Annual screening mammography.

I have discussed the findings and recommendations with the patient.
If applicable, a reminder letter will be sent to the patient
regarding the next appointment.

BI-RADS CATEGORY  1: Negative.

## 2024-03-08 ENCOUNTER — Other Ambulatory Visit: Payer: Self-pay | Admitting: Internal Medicine

## 2024-03-08 DIAGNOSIS — Z1231 Encounter for screening mammogram for malignant neoplasm of breast: Secondary | ICD-10-CM

## 2024-03-29 ENCOUNTER — Encounter (HOSPITAL_COMMUNITY): Payer: Self-pay | Admitting: Pharmacist

## 2024-03-29 ENCOUNTER — Other Ambulatory Visit (HOSPITAL_COMMUNITY): Payer: Self-pay

## 2024-03-29 MED ORDER — VALACYCLOVIR HCL 500 MG PO TABS
1000.0000 mg | ORAL_TABLET | Freq: Every day | ORAL | 5 refills | Status: AC
  Filled 2024-03-29: qty 20, 10d supply, fill #0

## 2024-03-31 ENCOUNTER — Other Ambulatory Visit (HOSPITAL_COMMUNITY): Payer: Self-pay

## 2024-03-31 MED ORDER — PEG 3350-KCL-NA BICARB-NACL 420 G PO SOLR
ORAL | 0 refills | Status: AC
  Filled 2024-03-31: qty 4000, 1d supply, fill #0

## 2024-03-31 MED ORDER — BISACODYL 5 MG PO TBEC
DELAYED_RELEASE_TABLET | ORAL | 0 refills | Status: AC
  Filled 2024-03-31: qty 4, 1d supply, fill #0

## 2024-04-06 ENCOUNTER — Ambulatory Visit
Admission: RE | Admit: 2024-04-06 | Discharge: 2024-04-06 | Disposition: A | Source: Ambulatory Visit | Attending: Internal Medicine | Admitting: Internal Medicine

## 2024-04-06 ENCOUNTER — Ambulatory Visit

## 2024-04-06 DIAGNOSIS — Z1231 Encounter for screening mammogram for malignant neoplasm of breast: Secondary | ICD-10-CM
